# Patient Record
Sex: Female | Born: 1935 | Race: White | Hispanic: No | Smoking: Never smoker
Health system: Southern US, Community
[De-identification: ages and names within clinical notes are randomized; demographics above are authoritative.]

## PROBLEM LIST (undated history)

## (undated) DIAGNOSIS — K219 Gastro-esophageal reflux disease without esophagitis: Secondary | ICD-10-CM

## (undated) DIAGNOSIS — C801 Malignant (primary) neoplasm, unspecified: Secondary | ICD-10-CM

## (undated) DIAGNOSIS — J45909 Unspecified asthma, uncomplicated: Secondary | ICD-10-CM

## (undated) DIAGNOSIS — D696 Thrombocytopenia, unspecified: Secondary | ICD-10-CM

## (undated) DIAGNOSIS — M1990 Unspecified osteoarthritis, unspecified site: Secondary | ICD-10-CM

## (undated) DIAGNOSIS — I10 Essential (primary) hypertension: Secondary | ICD-10-CM

## (undated) DIAGNOSIS — F419 Anxiety disorder, unspecified: Secondary | ICD-10-CM

## (undated) DIAGNOSIS — R51 Headache: Secondary | ICD-10-CM

## (undated) HISTORY — PX: GANGLION CYST EXCISION: SHX1691

## (undated) HISTORY — PX: ANKLE FUSION: SHX881

## (undated) HISTORY — PX: OTHER SURGICAL HISTORY: SHX169

## (undated) HISTORY — PX: ABDOMINAL HYSTERECTOMY: SHX81

## (undated) HISTORY — PX: EYE SURGERY: SHX253

## (undated) HISTORY — PX: OVARIAN CYST SURGERY: SHX726

## (undated) SURGERY — HEMIARTHROPLASTY, HIP, DIRECT ANTERIOR APPROACH, FOR FRACTURE
Anesthesia: Choice | Laterality: Left

---

## 1998-05-14 ENCOUNTER — Ambulatory Visit (HOSPITAL_COMMUNITY): Admission: RE | Admit: 1998-05-14 | Discharge: 1998-05-14 | Payer: Self-pay | Admitting: Orthopaedic Surgery

## 2003-06-15 ENCOUNTER — Ambulatory Visit (HOSPITAL_COMMUNITY): Admission: RE | Admit: 2003-06-15 | Discharge: 2003-06-15 | Payer: Self-pay | Admitting: Obstetrics and Gynecology

## 2003-08-16 ENCOUNTER — Encounter (INDEPENDENT_AMBULATORY_CARE_PROVIDER_SITE_OTHER): Payer: Self-pay | Admitting: Specialist

## 2003-08-16 ENCOUNTER — Ambulatory Visit (HOSPITAL_COMMUNITY): Admission: RE | Admit: 2003-08-16 | Discharge: 2003-08-16 | Payer: Self-pay | Admitting: Gastroenterology

## 2004-06-19 ENCOUNTER — Encounter: Admission: RE | Admit: 2004-06-19 | Discharge: 2004-06-19 | Payer: Self-pay | Admitting: Family Medicine

## 2004-10-02 ENCOUNTER — Ambulatory Visit (HOSPITAL_COMMUNITY): Admission: RE | Admit: 2004-10-02 | Discharge: 2004-10-02 | Payer: Self-pay | Admitting: Obstetrics and Gynecology

## 2006-04-06 ENCOUNTER — Encounter: Admission: RE | Admit: 2006-04-06 | Discharge: 2006-04-06 | Payer: Self-pay | Admitting: Otolaryngology

## 2007-12-20 ENCOUNTER — Encounter (INDEPENDENT_AMBULATORY_CARE_PROVIDER_SITE_OTHER): Payer: Self-pay | Admitting: Orthopedic Surgery

## 2007-12-20 ENCOUNTER — Ambulatory Visit: Payer: Self-pay | Admitting: Vascular Surgery

## 2007-12-20 ENCOUNTER — Ambulatory Visit (HOSPITAL_COMMUNITY): Admission: RE | Admit: 2007-12-20 | Discharge: 2007-12-20 | Payer: Self-pay | Admitting: Orthopedic Surgery

## 2010-12-27 NOTE — Op Note (Signed)
NAME:  DESHANNA, KAMA                            ACCOUNT NO.:  [id number]   MEDICAL RECORD NO.:  [id number]                   PATIENT TYPE:  AMB   LOCATION:  ENDO                                 FACILITY:  [facility]   PHYSICIAN:  Danise Edge, M.D.                DATE OF BIRTH:  [birth year]   DATE OF PROCEDURE:  08/16/2003  DATE OF DISCHARGE:                                 OPERATIVE REPORT   PROCEDURE:  Colonoscopy and polypectomy.   PROCEDURE INDICATION:  Ms. Rielly Brunn is a 77 year old female born Oct 20, 1933.  Mr. Denny Levy mother underwent surgery for colon cancer in her 63s.   ENDOSCOPIST:  Danise Edge, M.D.   PREMEDICATION:  Versed 7 mg, Demerol 70 mg.   DESCRIPTION OF PROCEDURE:  After obtaining informed consent, Mr. Rosalia Hammers was  placed in the left lateral decubitus position.  I administered intravenous  Demerol and intravenous Versed to achieve conscious sedation for the  procedure.  The patient's blood pressure, oxygen saturation, and cardiac  rhythm were monitored throughout the procedure and documented in the medical  record.   Anal inspection was normal.  Digital rectal exam was normal.  The Olympus  adjustable pediatric colonoscope was introduced into the rectum and advanced  to the cecum.  Colonic preparation for the exam today was excellent.   Rectum normal.   Sigmoid colon and descending colon:  At 55 cm from the anal verge a 2 mm  sessile polyp was removed with electrocautery snare.  No pathology was  retrieved.   Splenic flexure normal.   Transverse colon normal.   Hepatic flexure:  A 3-4 mm sessile polyp was removed from the hepatic  flexure with the electrocautery snare and submitted for pathologic  interpretation.   Ascending colon normal.   Cecum and ileocecal valve normal.   ASSESSMENT:  1. A small polyp was removed from the hepatic flexure with the     electrocautery snare and submitted for pathologic interpretation.  2. A small polyp was  removed from the descending colon with the     electrocautery snare, but no pathology was retrieved.   RECOMMENDATIONS:  Repeat colonoscopy in five years.                                               Danise Edge, M.D.    MJ/MEDQ  D:  08/16/2003  T:  08/16/2003  Job:  [id number]   cc:   L. Lupe Carney, M.D.  [address] [place]  [place] [address]  Fax: [phone]

## 2012-07-19 ENCOUNTER — Other Ambulatory Visit: Payer: Self-pay | Admitting: Internal Medicine

## 2012-07-19 DIAGNOSIS — R109 Unspecified abdominal pain: Secondary | ICD-10-CM

## 2012-07-19 DIAGNOSIS — R197 Diarrhea, unspecified: Secondary | ICD-10-CM

## 2012-07-21 ENCOUNTER — Ambulatory Visit
Admission: RE | Admit: 2012-07-21 | Discharge: 2012-07-21 | Disposition: A | Discharge status: Home / Self Care | Payer: Medicare Other | Source: Ambulatory Visit | Attending: Internal Medicine | Admitting: Internal Medicine

## 2012-07-21 DIAGNOSIS — R197 Diarrhea, unspecified: Secondary | ICD-10-CM

## 2012-07-21 DIAGNOSIS — R109 Unspecified abdominal pain: Secondary | ICD-10-CM

## 2013-05-12 ENCOUNTER — Inpatient Hospital Stay (HOSPITAL_COMMUNITY)
Admission: AD | Admit: 2013-05-12 | Discharge: 2013-05-16 | DRG: 470 | Disposition: A | Discharge status: Skilled Nursing Facility | Payer: Medicare Other | Source: Intra-hospital | Attending: Internal Medicine | Admitting: Internal Medicine

## 2013-05-12 ENCOUNTER — Encounter (HOSPITAL_COMMUNITY): Payer: Self-pay | Admitting: Internal Medicine

## 2013-05-12 DIAGNOSIS — E876 Hypokalemia: Secondary | ICD-10-CM | POA: Diagnosis present

## 2013-05-12 DIAGNOSIS — S72143A Displaced intertrochanteric fracture of unspecified femur, initial encounter for closed fracture: Principal | ICD-10-CM | POA: Diagnosis present

## 2013-05-12 DIAGNOSIS — W108XXA Fall (on) (from) other stairs and steps, initial encounter: Secondary | ICD-10-CM | POA: Diagnosis present

## 2013-05-12 DIAGNOSIS — Z882 Allergy status to sulfonamides status: Secondary | ICD-10-CM

## 2013-05-12 DIAGNOSIS — Z79899 Other long term (current) drug therapy: Secondary | ICD-10-CM

## 2013-05-12 DIAGNOSIS — D696 Thrombocytopenia, unspecified: Secondary | ICD-10-CM

## 2013-05-12 DIAGNOSIS — D62 Acute posthemorrhagic anemia: Secondary | ICD-10-CM | POA: Diagnosis not present

## 2013-05-12 DIAGNOSIS — K219 Gastro-esophageal reflux disease without esophagitis: Secondary | ICD-10-CM

## 2013-05-12 DIAGNOSIS — Z7901 Long term (current) use of anticoagulants: Secondary | ICD-10-CM

## 2013-05-12 DIAGNOSIS — I10 Essential (primary) hypertension: Secondary | ICD-10-CM

## 2013-05-12 DIAGNOSIS — K5900 Constipation, unspecified: Secondary | ICD-10-CM

## 2013-05-12 DIAGNOSIS — S72142A Displaced intertrochanteric fracture of left femur, initial encounter for closed fracture: Secondary | ICD-10-CM

## 2013-05-12 DIAGNOSIS — Y92009 Unspecified place in unspecified non-institutional (private) residence as the place of occurrence of the external cause: Secondary | ICD-10-CM

## 2013-05-12 HISTORY — DX: Gastro-esophageal reflux disease without esophagitis: K21.9

## 2013-05-12 HISTORY — DX: Essential (primary) hypertension: I10

## 2013-05-12 MED ORDER — HYDROCODONE-ACETAMINOPHEN 5-325 MG PO TABS
1.0000 | ORAL_TABLET | Freq: Four times a day (QID) | ORAL | Status: DC | PRN
  Administered 2013-05-13: 1 via ORAL
  Filled 2013-05-12 (×2): qty 1

## 2013-05-12 MED ORDER — PANTOPRAZOLE SODIUM 40 MG PO TBEC
40.0000 mg | DELAYED_RELEASE_TABLET | Freq: Every day | ORAL | Status: DC
Start: 2013-05-13 — End: 2013-05-13
  Administered 2013-05-13: 40 mg via ORAL
  Filled 2013-05-12: qty 1

## 2013-05-12 MED ORDER — MORPHINE SULFATE 2 MG/ML IJ SOLN
0.5000 mg | INTRAMUSCULAR | Status: DC | PRN
  Administered 2013-05-13: 0.5 mg via INTRAVENOUS
  Filled 2013-05-12: qty 1

## 2013-05-12 NOTE — Consult Note (Signed)
No primary provider on file. Chief Complaint: S/p fall History: Pleasant woman who fell from standing height. No LOC.  She did strike her head and has a bruise.  Unable to ambulate or weight bear on left leg.  Transferred from outside hospital for definitive fracture mangement  No past medical history on file.  Allergies  Allergen Reactions  . Iohexol      Code: HIVES, Desc: (+) SKIN TEST 40YRS AGO, OK W/ 1VP 15 YRS AGO, TODAY W/ HIVES, 50MG BENADRYL PO..PT OK, SUGGESTED 13 PRE MEDS FOR FUTURE IV CONTRAST PER DR.MATTERN//A.C.PT CALLS US 2 DAYS POST INJ, SHE HAS BEEN NAUSEATED SINCE EXAM & HAS STARTED TO ITCH AGAIN,,, WE RE, Onset Date: [id number]     No current facility-administered medications on file prior to encounter.   No current outpatient prescriptions on file prior to encounter.    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   [facility] Admit to medical team per fracture protocal Will review films with my partner for definitive fracture management   

## 2013-05-12 NOTE — Progress Notes (Deleted)
Called by Dr. Floydene Flock from [facility] in [place]. Pt with a no significant phx presents with fracture to L fem s/p mechanical fall off a friend's deck. 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 Dr. Shon Baton (Ortho) was already called with instructions to formally consult ortho when pt arrives to [facility].  Pt accepted to med-surg bed.

## 2013-05-12 NOTE — H&P (Signed)
[facility] History and Physical  Heather Fitzgerald:[id number] DOB: 1933/12/10 DOA: 05/12/2013  Referring physician: ED PCP: No primary provider on file.    Chief Complaint: Fall, Hip pain  HPI: Heather Fitzgerald is a 79 y.o. female previously healthy, who suffered a mechanical fall while walking on a friends deck and missed a step down (didnt know that a step was there).  She fell landing on her L side.  After the fall she was unable to bear weight due to severe pain in her L hip.  Pain better with rest, worse with movement.  She was taken to [facility] where a L hip fracture was diagnosed.  The patient was then transferred to [facility]  Review of Systems: 12 systems reviewed and otherwise negative.  No past medical history on file. No past surgical history on file. Social History:  reports that she has never smoked. She does not have any smokeless tobacco history on file. She reports that she does not drink alcohol or use illicit drugs.  Allergies  Allergen Reactions  . Iohexol      Code: HIVES, Desc: (+) SKIN TEST 46YRS AGO, OK W/ 1VP 15 YRS AGO, TODAY W/ HIVES, 50MG  BENADRYL PO.Marland KitchenPT OK, SUGGESTED 13 PRE MEDS FOR FUTURE IV CONTRAST PER DR.MATTERN//A.C.PT CALLS [place] 2 DAYS POST INJ, SHE HAS BEEN NAUSEATED SINCE EXAM & HAS STARTED TO ITCH AGAIN,,, WE RE, Onset Date: [id number]     No family history on file.  Prior to Admission medications   Not on File   Physical Exam: Filed Vitals:   05/12/13 2240  BP: 152/68  Pulse: 85  Temp: 98.3 F (36.8 C)  Resp: 20    General:  NAD, resting comfortably in bed Eyes: PEERLA EOMI ENT: mucous membranes moist Neck: supple w/o JVD Cardiovascular: RRR w/o MRG Respiratory: CTA B Abdomen: soft, nt, nd, bs+ Skin: no rash nor lesion Musculoskeletal: MAE, pain at L hip Psychiatric: normal tone and affect Neurologic: AAOx3, grossly non-focal  Labs on Admission:  Basic Metabolic Panel: No results found for this basename: NA, K, CL, CO2,  GLUCOSE, BUN, CREATININE, CALCIUM, MG, PHOS,  in the last 168 hours Liver Function Tests: No results found for this basename: AST, ALT, ALKPHOS, BILITOT, PROT, ALBUMIN,  in the last 168 hours No results found for this basename: LIPASE, AMYLASE,  in the last 168 hours No results found for this basename: AMMONIA,  in the last 168 hours CBC: No results found for this basename: WBC, NEUTROABS, HGB, HCT, MCV, PLT,  in the last 168 hours Cardiac Enzymes: No results found for this basename: CKTOTAL, CKMB, CKMBINDEX, TROPONINI,  in the last 168 hours  BNP (last 3 results) No results found for this basename: PROBNP,  in the last 8760 hours CBG: No results found for this basename: GLUCAP,  in the last 168 hours  Radiological Exams on Admission: No results found.  EKG: Ordered and pending.  Assessment/Plan Principal Problem:   Intertrochanteric fracture of left hip   1. Intertrochanteric fracture of left hip - Patient to be evaluated by ortho tomorrow AM, likely will then go to OR for repair.  Ordering lab work, understand that she was initially hypokalemic at outside facility, checking BMP to ensure that this has been corrected with the replacement she received.  EKG also ordered for pre-op evaluation.  No chest pain nor SOB recently, sounds like she is very active at baseline.    Code Status: Full Code (must indicate code status--if unknown  or must be presumed, indicate so) Family Communication: No family in room (indicate person spoken with, if applicable, with phone number if by telephone) Disposition Plan: Admit to inpatient (indicate anticipated LOS)  Time spent: 70 min  Treylin Burtch M. [facility] Pager [phone]  If 7PM-7AM, please contact night-coverage [email or web address] Password [email or web address] 05/12/2013, 11:40 PM

## 2013-05-12 NOTE — Progress Notes (Signed)
Called by Dr. Floydene Flock from [facility] in [place]. Pt with a no significant phx presents with fracture to L fem s/p mechanical fall off a friend's deck. 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 Dr. Shon Baton (Ortho) was already called with instructions to formally consult ortho when pt arrives to [facility].   Pt accepted to med-surg bed.

## 2013-05-13 ENCOUNTER — Encounter (HOSPITAL_COMMUNITY)
Admission: AD | Disposition: A | Discharge status: Skilled Nursing Facility | Payer: Self-pay | Source: Intra-hospital | Attending: Internal Medicine

## 2013-05-13 ENCOUNTER — Encounter (HOSPITAL_COMMUNITY): Payer: Self-pay | Admitting: Anesthesiology

## 2013-05-13 ENCOUNTER — Inpatient Hospital Stay (HOSPITAL_COMMUNITY): Payer: Medicare Other | Admitting: Anesthesiology

## 2013-05-13 ENCOUNTER — Inpatient Hospital Stay (HOSPITAL_COMMUNITY): Payer: Medicare Other

## 2013-05-13 DIAGNOSIS — I10 Essential (primary) hypertension: Secondary | ICD-10-CM

## 2013-05-13 DIAGNOSIS — K219 Gastro-esophageal reflux disease without esophagitis: Secondary | ICD-10-CM

## 2013-05-13 DIAGNOSIS — D696 Thrombocytopenia, unspecified: Secondary | ICD-10-CM

## 2013-05-13 HISTORY — PX: HIP ARTHROPLASTY: SHX981

## 2013-05-13 LAB — CBC
HCT: 38.8 % (ref 36.0–46.0)
Hemoglobin: 13.1 g/dL (ref 12.0–15.0)
MCH: 29.6 pg (ref 26.0–34.0)
MCHC: 33.8 g/dL (ref 30.0–36.0)
MCV: 87.6 fL (ref 78.0–100.0)
RDW: 13.8 % (ref 11.5–15.5)
WBC: 6 10*3/uL (ref 4.0–10.5)

## 2013-05-13 LAB — APTT: aPTT: 26 seconds (ref 24–37)

## 2013-05-13 LAB — BASIC METABOLIC PANEL
BUN: 11 mg/dL (ref 6–23)
Calcium: 8.1 mg/dL — ABNORMAL LOW (ref 8.4–10.5)
Chloride: 102 mEq/L (ref 96–112)
GFR calc Af Amer: >90 mL/min (ref >90)
GFR calc non Af Amer: 88 mL/min — ABNORMAL LOW (ref >90)
Potassium: 3.7 mEq/L (ref 3.5–5.1)
Sodium: 137 mEq/L (ref 135–145)

## 2013-05-13 LAB — PROTIME-INR
INR: 1.02 (ref 0.00–1.49)
Prothrombin Time: 13.2 seconds (ref 11.6–15.2)

## 2013-05-13 SURGERY — HEMIARTHROPLASTY, HIP, DIRECT ANTERIOR APPROACH, FOR FRACTURE
Anesthesia: General | Site: Hip | Laterality: Left | Wound class: Clean

## 2013-05-13 MED ORDER — ACETAMINOPHEN 325 MG PO TABS: 650.0000 mg | ORAL_TABLET | Freq: Four times a day (QID) | ORAL | Status: DC | PRN

## 2013-05-13 MED ORDER — ALPRAZOLAM 0.25 MG PO TABS: 0.2500 mg | ORAL_TABLET | Freq: Every day | ORAL | Status: DC | PRN

## 2013-05-13 MED ORDER — PHENOL 1.4 % MT LIQD: 1.0000 | OROMUCOSAL | Status: DC | PRN

## 2013-05-13 MED ORDER — METOCLOPRAMIDE HCL 5 MG/ML IJ SOLN
5.0000 mg | Freq: Three times a day (TID) | INTRAMUSCULAR | Status: DC | PRN
  Administered 2013-05-13: 5 mg via INTRAVENOUS
  Filled 2013-05-13: qty 2

## 2013-05-13 MED ORDER — ESTROGENS CONJUGATED 0.625 MG PO TABS
0.6250 mg | ORAL_TABLET | Freq: Every day | ORAL | Status: DC
  Administered 2013-05-14 – 2013-05-16 (×3): 0.625 mg via ORAL
  Filled 2013-05-13 (×3): qty 1

## 2013-05-13 MED ORDER — METHOCARBAMOL 100 MG/ML IJ SOLN: 500.0000 mg | Freq: Four times a day (QID) | INTRAVENOUS | Status: DC | PRN

## 2013-05-13 MED ORDER — MENTHOL 3 MG MT LOZG: 1.0000 | LOZENGE | OROMUCOSAL | Status: DC | PRN

## 2013-05-13 MED ORDER — ONDANSETRON HCL 4 MG PO TABS: 4.0000 mg | ORAL_TABLET | Freq: Four times a day (QID) | ORAL | Status: DC | PRN

## 2013-05-13 MED ORDER — CEFAZOLIN SODIUM-DEXTROSE 2-3 GM-% IV SOLR
2.0000 g | INTRAVENOUS | Status: AC
  Administered 2013-05-13: 2 g via INTRAVENOUS
  Filled 2013-05-13 (×2): qty 50

## 2013-05-13 MED ORDER — DIAZEPAM 5 MG PO TABS: 5.0000 mg | ORAL_TABLET | Freq: Once | ORAL | Status: DC

## 2013-05-13 MED ORDER — LIDOCAINE HCL (CARDIAC) 20 MG/ML IV SOLN
INTRAVENOUS | Status: DC | PRN
  Administered 2013-05-13: 100 mg via INTRAVENOUS

## 2013-05-13 MED ORDER — SODIUM CHLORIDE 0.9 % IV SOLN
INTRAVENOUS | Status: DC
  Administered 2013-05-13: 23:00:00 via INTRAVENOUS

## 2013-05-13 MED ORDER — SUFENTANIL CITRATE 50 MCG/ML IV SOLN
INTRAVENOUS | Status: DC | PRN
  Administered 2013-05-13 (×2): 10 ug via INTRAVENOUS

## 2013-05-13 MED ORDER — ONDANSETRON HCL 4 MG/2ML IJ SOLN
INTRAMUSCULAR | Status: DC | PRN
  Administered 2013-05-13: 4 mg via INTRAVENOUS

## 2013-05-13 MED ORDER — DIAZEPAM 5 MG/ML IJ SOLN
5.0000 mg | Freq: Once | INTRAMUSCULAR | Status: AC
  Administered 2013-05-13: 5 mg via INTRAVENOUS
  Filled 2013-05-13: qty 2

## 2013-05-13 MED ORDER — CYCLOBENZAPRINE HCL 10 MG PO TABS: 10.0000 mg | ORAL_TABLET | Freq: Three times a day (TID) | ORAL | Status: DC | PRN

## 2013-05-13 MED ORDER — TRAMADOL HCL 50 MG PO TABS: 50.0000 mg | ORAL_TABLET | Freq: Four times a day (QID) | ORAL | Status: DC | PRN

## 2013-05-13 MED ORDER — GLYCOPYRROLATE 0.2 MG/ML IJ SOLN
INTRAMUSCULAR | Status: DC | PRN
  Administered 2013-05-13: 0.4 mg via INTRAVENOUS

## 2013-05-13 MED ORDER — BISACODYL 10 MG RE SUPP: 10.0000 mg | Freq: Every day | RECTAL | Status: DC | PRN

## 2013-05-13 MED ORDER — SODIUM CHLORIDE 0.9 % IV SOLN
INTRAVENOUS | Status: DC
  Administered 2013-05-13: 12:00:00 via INTRAVENOUS

## 2013-05-13 MED ORDER — METOCLOPRAMIDE HCL 5 MG/ML IJ SOLN: 10.0000 mg | Freq: Once | INTRAMUSCULAR | Status: DC | PRN

## 2013-05-13 MED ORDER — LACTATED RINGERS IV SOLN
INTRAVENOUS | Status: DC | PRN
  Administered 2013-05-13 (×2): via INTRAVENOUS

## 2013-05-13 MED ORDER — MIDAZOLAM HCL 5 MG/5ML IJ SOLN
INTRAMUSCULAR | Status: DC | PRN
  Administered 2013-05-13: 2 mg via INTRAVENOUS

## 2013-05-13 MED ORDER — PHENYLEPHRINE HCL 10 MG/ML IJ SOLN
INTRAMUSCULAR | Status: DC | PRN
  Administered 2013-05-13 (×5): 80 ug via INTRAVENOUS

## 2013-05-13 MED ORDER — ROCURONIUM BROMIDE 100 MG/10ML IV SOLN
INTRAVENOUS | Status: DC | PRN
  Administered 2013-05-13: 35 mg via INTRAVENOUS

## 2013-05-13 MED ORDER — ONDANSETRON HCL 4 MG/2ML IJ SOLN: 4.0000 mg | Freq: Four times a day (QID) | INTRAMUSCULAR | Status: DC | PRN

## 2013-05-13 MED ORDER — ESTROGENS CONJ SYNTHETIC A 0.625 MG PO TABS: 0.6250 mg | ORAL_TABLET | Freq: Every day | ORAL | Status: DC

## 2013-05-13 MED ORDER — METHOCARBAMOL 500 MG PO TABS
500.0000 mg | ORAL_TABLET | Freq: Four times a day (QID) | ORAL | Status: DC | PRN
  Administered 2013-05-15 (×2): 500 mg via ORAL
  Filled 2013-05-13 (×2): qty 1

## 2013-05-13 MED ORDER — HYDRALAZINE HCL 20 MG/ML IJ SOLN: 10.0000 mg | Freq: Three times a day (TID) | INTRAMUSCULAR | Status: DC | PRN

## 2013-05-13 MED ORDER — SODIUM CHLORIDE 0.9 % IR SOLN
Status: DC | PRN
  Administered 2013-05-13: 1000 mL

## 2013-05-13 MED ORDER — PANTOPRAZOLE SODIUM 40 MG PO TBEC
40.0000 mg | DELAYED_RELEASE_TABLET | Freq: Every day | ORAL | Status: DC
  Administered 2013-05-14 – 2013-05-16 (×3): 40 mg via ORAL
  Filled 2013-05-13 (×3): qty 1

## 2013-05-13 MED ORDER — NEOSTIGMINE METHYLSULFATE 1 MG/ML IJ SOLN
INTRAMUSCULAR | Status: DC | PRN
  Administered 2013-05-13: 3 mg via INTRAVENOUS

## 2013-05-13 MED ORDER — HYDROCODONE-ACETAMINOPHEN 5-325 MG PO TABS
1.0000 | ORAL_TABLET | Freq: Four times a day (QID) | ORAL | Status: DC | PRN
  Administered 2013-05-13 – 2013-05-16 (×7): 1 via ORAL
  Filled 2013-05-13 (×7): qty 1

## 2013-05-13 MED ORDER — DEXAMETHASONE SODIUM PHOSPHATE 4 MG/ML IJ SOLN
INTRAMUSCULAR | Status: DC | PRN
  Administered 2013-05-13: 4 mg via INTRAVENOUS

## 2013-05-13 MED ORDER — FENTANYL CITRATE 0.05 MG/ML IJ SOLN: 25.0000 ug | INTRAMUSCULAR | Status: DC | PRN

## 2013-05-13 MED ORDER — FERROUS SULFATE 325 (65 FE) MG PO TABS
325.0000 mg | ORAL_TABLET | Freq: Three times a day (TID) | ORAL | Status: DC
  Administered 2013-05-14 – 2013-05-16 (×7): 325 mg via ORAL
  Filled 2013-05-13 (×10): qty 1

## 2013-05-13 MED ORDER — CHLORHEXIDINE GLUCONATE 4 % EX LIQD
60.0000 mL | Freq: Once | CUTANEOUS | Status: DC
  Filled 2013-05-13: qty 60

## 2013-05-13 MED ORDER — PROPOFOL 10 MG/ML IV BOLUS
INTRAVENOUS | Status: DC | PRN
  Administered 2013-05-13: 150 mg via INTRAVENOUS

## 2013-05-13 MED ORDER — ENOXAPARIN SODIUM 40 MG/0.4ML ~~LOC~~ SOLN
40.0000 mg | SUBCUTANEOUS | Status: DC
Start: 2013-05-14 — End: 2013-05-16
  Administered 2013-05-14 – 2013-05-16 (×3): 40 mg via SUBCUTANEOUS
  Filled 2013-05-13 (×4): qty 0.4

## 2013-05-13 MED ORDER — ACETAMINOPHEN 650 MG RE SUPP: 650.0000 mg | Freq: Four times a day (QID) | RECTAL | Status: DC | PRN

## 2013-05-13 MED ORDER — CEFAZOLIN SODIUM-DEXTROSE 2-3 GM-% IV SOLR
2.0000 g | Freq: Four times a day (QID) | INTRAVENOUS | Status: AC
  Administered 2013-05-13 – 2013-05-14 (×2): 2 g via INTRAVENOUS
  Filled 2013-05-13 (×2): qty 50

## 2013-05-13 MED ORDER — MORPHINE SULFATE 2 MG/ML IJ SOLN: 0.5000 mg | INTRAMUSCULAR | Status: DC | PRN

## 2013-05-13 MED ORDER — CYCLOBENZAPRINE HCL 10 MG PO TABS: 5.0000 mg | ORAL_TABLET | Freq: Three times a day (TID) | ORAL | Status: DC | PRN

## 2013-05-13 MED ORDER — METOCLOPRAMIDE HCL 10 MG PO TABS: 5.0000 mg | ORAL_TABLET | Freq: Three times a day (TID) | ORAL | Status: DC | PRN

## 2013-05-13 MED ORDER — LOTEPREDNOL ETABONATE 0.5 % OP SUSP
2.0000 [drp] | Freq: Two times a day (BID) | OPHTHALMIC | Status: DC
  Administered 2013-05-13: 2 [drp] via OPHTHALMIC
  Filled 2013-05-13: qty 5

## 2013-05-13 MED ORDER — ENOXAPARIN SODIUM 40 MG/0.4ML ~~LOC~~ SOLN: 40.0000 mg | SUBCUTANEOUS | Status: DC

## 2013-05-13 MED ORDER — PANTOPRAZOLE SODIUM 40 MG IV SOLR
40.0000 mg | Freq: Two times a day (BID) | INTRAVENOUS | Status: DC
  Administered 2013-05-13: 40 mg via INTRAVENOUS
  Filled 2013-05-13 (×2): qty 40

## 2013-05-13 SURGICAL SUPPLY — 47 items
BLADE SAW SAG 73X25 THK (BLADE) ×1
BLADE SAW SGTL 73X25 THK (BLADE) ×1 IMPLANT
BRUSH FEMORAL CANAL (MISCELLANEOUS) IMPLANT
CAPT HIP HD POR BIPOL/UNIPOL ×1 IMPLANT
CLOTH BEACON ORANGE TIMEOUT ST (SAFETY) ×2 IMPLANT
COVER SURGICAL LIGHT HANDLE (MISCELLANEOUS) ×2 IMPLANT
DRAPE INCISE IOBAN 66X45 STRL (DRAPES) ×2 IMPLANT
DRAPE ORTHO SPLIT 77X108 STRL (DRAPES) ×4
DRAPE SURG ORHT 6 SPLT 77X108 (DRAPES) ×2 IMPLANT
DRAPE U-SHAPE 47X51 STRL (DRAPES) ×2 IMPLANT
DRSG MEPILEX BORDER 4X8 (GAUZE/BANDAGES/DRESSINGS) ×2 IMPLANT
DURAPREP 26ML APPLICATOR (WOUND CARE) ×2 IMPLANT
ELECT BLADE 4.0 EZ CLEAN MEGAD (MISCELLANEOUS) ×2
ELECT REM PT RETURN 9FT ADLT (ELECTROSURGICAL) ×2
ELECTRODE BLDE 4.0 EZ CLN MEGD (MISCELLANEOUS) IMPLANT
ELECTRODE REM PT RTRN 9FT ADLT (ELECTROSURGICAL) ×1 IMPLANT
GLOVE BIOGEL PI ORTHO PRO 7.5 (GLOVE) ×1
GLOVE BIOGEL PI ORTHO PRO SZ8 (GLOVE) ×1
GLOVE ORTHO TXT STRL SZ7.5 (GLOVE) ×2 IMPLANT
GLOVE PI ORTHO PRO STRL 7.5 (GLOVE) ×1 IMPLANT
GLOVE PI ORTHO PRO STRL SZ8 (GLOVE) ×1 IMPLANT
GLOVE SURG ORTHO 8.5 STRL (GLOVE) ×2 IMPLANT
GOWN STRL NON-REIN LRG LVL3 (GOWN DISPOSABLE) ×2 IMPLANT
GOWN STRL REIN XL XLG (GOWN DISPOSABLE) ×4 IMPLANT
HANDPIECE INTERPULSE COAX TIP (DISPOSABLE)
KIT BASIN OR (CUSTOM PROCEDURE TRAY) ×2 IMPLANT
KIT ROOM TURNOVER OR (KITS) ×2 IMPLANT
MANIFOLD NEPTUNE II (INSTRUMENTS) ×2 IMPLANT
NS IRRIG 1000ML POUR BTL (IV SOLUTION) ×2 IMPLANT
PACK TOTAL JOINT (CUSTOM PROCEDURE TRAY) ×2 IMPLANT
PAD ARMBOARD 7.5X6 YLW CONV (MISCELLANEOUS) ×4 IMPLANT
SET HNDPC FAN SPRY TIP SCT (DISPOSABLE) IMPLANT
STAPLER VISISTAT 35W (STAPLE) ×2 IMPLANT
STRIP CLOSURE SKIN 1/2X4 (GAUZE/BANDAGES/DRESSINGS) IMPLANT
SUT ETHIBOND NAB CT1 #1 30IN (SUTURE) ×4 IMPLANT
SUT MNCRL AB 3-0 PS2 18 (SUTURE) IMPLANT
SUT VIC AB 0 CT1 27 (SUTURE) ×2
SUT VIC AB 0 CT1 27XBRD ANBCTR (SUTURE) ×1 IMPLANT
SUT VIC AB 1 CT1 27 (SUTURE)
SUT VIC AB 1 CT1 27XBRD ANBCTR (SUTURE) IMPLANT
SUT VIC AB 2-0 CT1 27 (SUTURE) ×2
SUT VIC AB 2-0 CT1 TAPERPNT 27 (SUTURE) ×1 IMPLANT
TOWEL OR 17X24 6PK STRL BLUE (TOWEL DISPOSABLE) ×2 IMPLANT
TOWEL OR 17X26 10 PK STRL BLUE (TOWEL DISPOSABLE) ×2 IMPLANT
TOWER CARTRIDGE SMART MIX (DISPOSABLE) IMPLANT
TRAY FOLEY CATH 16FRSI W/METER (SET/KITS/TRAYS/PACK) IMPLANT
WATER STERILE IRR 1000ML POUR (IV SOLUTION) ×6 IMPLANT

## 2013-05-13 NOTE — Preoperative (Signed)
Beta Blockers   Reason not to administer Beta Blockers:Not Applicable 

## 2013-05-13 NOTE — Anesthesia Preprocedure Evaluation (Signed)
Anesthesia Evaluation  Patient identified by MRN, date of birth, ID band Patient awake    Reviewed: Allergy & Precautions, H&P , NPO status , Patient's Chart, lab work & pertinent test results, reviewed documented beta blocker date and time   Airway Mallampati: II TM Distance: >3 FB Neck ROM: full    Dental   Pulmonary neg pulmonary ROS,  breath sounds clear to auscultation        Cardiovascular hypertension, On Medications Rhythm:regular     Neuro/Psych negative neurological ROS  negative psych ROS   GI/Hepatic Neg liver ROS, GERD-  Medicated,  Endo/Other  negative endocrine ROS  Renal/GU negative Renal ROS  negative genitourinary   Musculoskeletal   Abdominal   Peds  Hematology negative hematology ROS (+)   Anesthesia Other Findings See surgeon's H&P   Reproductive/Obstetrics negative OB ROS                           Anesthesia Physical Anesthesia Plan  ASA: II  Anesthesia Plan: General   Post-op Pain Management:    Induction: Intravenous  Airway Management Planned: Oral ETT  Additional Equipment:   Intra-op Plan:   Post-operative Plan: Extubation in OR  Informed Consent: I have reviewed the patients History and Physical, chart, labs and discussed the procedure including the risks, benefits and alternatives for the proposed anesthesia with the patient or authorized representative who has indicated his/her understanding and acceptance.   Dental Advisory Given  Plan Discussed with: CRNA and Surgeon  Anesthesia Plan Comments:         Anesthesia Quick Evaluation

## 2013-05-13 NOTE — H&P (View-Only) (Signed)
No primary provider on file. Chief Complaint: S/p fall History: Pleasant woman who fell from standing height. No LOC.  She did strike her head and has a bruise.  Unable to ambulate or weight bear on left leg.  Transferred from outside hospital for definitive fracture mangement  No past medical history on file.  Allergies  Allergen Reactions  . Iohexol      Code: HIVES, Desc: (+) SKIN TEST 64YRS AGO, OK W/ 1VP 15 YRS AGO, TODAY W/ HIVES, 50MG  BENADRYL PO.Marland KitchenPT OK, SUGGESTED 13 PRE MEDS FOR FUTURE IV CONTRAST PER DR.MATTERN//A.C.PT CALLS [place] 2 DAYS POST INJ, SHE HAS BEEN NAUSEATED SINCE EXAM & HAS STARTED TO ITCH AGAIN,,, WE RE, Onset Date: [id number]     No current facility-administered medications on file prior to encounter.   No current outpatient prescriptions on file prior to encounter.    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   [facility] Admit to medical team per fracture protocal Will review films with my partner for definitive fracture management

## 2013-05-13 NOTE — Progress Notes (Signed)
[facility] PROGRESS NOTE  Heather Fitzgerald ZOX:[id number] DOB: 12-09-1933 DOA: 05/12/2013 PCP: No primary provider on file.  Assessment/Plan: 1-Intertrochanteric fracture of left hip after a mechanical fall. EKG with sinus rhythm no ST changes. Will check one time troponin due to chest discomfort. Moderate risk for surgery.   2-Hypertension : Blood pressure elevated. Will order PRN hydralazine.  3-GERD: protonix IV BID.  4-Thrombocytopenia: monitor.    Code Status: Full code.  Family Communication: Care discussed with patient.  Disposition Plan: remain in Hospital.    Consultants:  Dr Heather Fitzgerald No.   Procedures:  none  Antibiotics:  none  HPI/Subjective: She is complaining of cramps on right leg, chronic problem for her.  She is complaining of GERD like symptoms, burning sensation chest.  She denies chest pain or dyspnea on exertion.   Objective: Filed Vitals:   05/13/13 0800  BP:   Pulse:   Temp:   Resp: 18    Intake/Output Summary (Last 24 hours) at 05/13/13 1152 Last data filed at 05/13/13 0953  Gross per 24 hour  Intake      0 ml  Output   1200 ml  Net  -1200 ml   Filed Weights   05/12/13 2240  Weight: 59.5 kg (131 lb 2.8 oz)    Exam:   General:  No distress.   Cardiovascular: S 1, S 2 RRR  Respiratory: CTA  Abdomen: BS present, soft, nt  Musculoskeletal: no edema  Data Reviewed: Basic Metabolic Panel:  Recent Labs Lab 05/13/13 0030  NA 137  K 3.7  CL 102  CO2 25  GLUCOSE 129*  BUN 11  CREATININE 0.57  CALCIUM 8.1*   Liver Function Tests: No results found for this basename: AST, ALT, ALKPHOS, BILITOT, PROT, ALBUMIN,  in the last 168 hours No results found for this basename: LIPASE, AMYLASE,  in the last 168 hours No results found for this basename: AMMONIA,  in the last 168 hours CBC:  Recent Labs Lab 05/13/13 0030  WBC 6.0  HGB 13.1  HCT 38.8  MCV 87.6  PLT 119*   Cardiac Enzymes: No results found for this  basename: CKTOTAL, CKMB, CKMBINDEX, TROPONINI,  in the last 168 hours BNP (last 3 results) No results found for this basename: PROBNP,  in the last 8760 hours CBG: No results found for this basename: GLUCAP,  in the last 168 hours  No results found for this or any previous visit (from the past 240 hour(s)).   Studies: No results found.  Scheduled Meds: . pantoprazole (PROTONIX) IV  40 mg Intravenous Q12H   Continuous Infusions: . sodium chloride      Principal Problem:   Intertrochanteric fracture of left hip    Time spent: 25 minutes.     Heather Fitzgerald  [facility] Pager [phone]. If 7PM-7AM, please contact night-coverage at [email or web address], password [facility] 05/13/2013, 11:52 AM  LOS: 1 day

## 2013-05-13 NOTE — Interval H&P Note (Signed)
History and Physical Interval Note:  05/13/2013 4:51 PM  Heather Fitzgerald  has presented today for surgery, with the diagnosis of /  The various methods of treatment have been discussed with the patient and family. After consideration of risks, benefits and other options for treatment, the patient has consented to  Procedure(s): ARTHROPLASTY BIPOLAR HIP (Left) as a surgical intervention .  The patient's history has been reviewed, patient examined, no change in status, stable for surgery.  I have reviewed the patient's chart and labs.  Questions were answered to the patient's satisfaction.     [name],STEVEN R

## 2013-05-13 NOTE — Progress Notes (Signed)
INITIAL NUTRITION ASSESSMENT  DOCUMENTATION CODES Per approved criteria  -Not Applicable   INTERVENTION: 1. Advance diet per MD.  2. RD will monitor intake and add nutrition interventions as needed.   NUTRITION DIAGNOSIS: Inadequate oral intake related to inability to eat as evidenced by NPO status.  Goal: Patient will meet >/=90% of estimated nutrition needs.  Monitor:  Diet advancement, PO intake, weights, labs, I/O's  Reason for Assessment: Consult, Hip fracture protocol  79 y.o. female  Admitting Dx: Intertrochanteric fracture of left hip  ASSESSMENT: Previously healthy patient with a left intertrochanteric hip fracture s/p fall, awaiting likely surgical repair. She is currently NPO, but reports a good intake prior to admission.   Height: Ht Readings from Last 1 Encounters:  05/12/13 5\' 2"  (1.575 m)    Weight: Wt Readings from Last 1 Encounters:  05/12/13 131 lb 2.8 oz (59.5 kg)    Ideal Body Weight: 110 pounds  % Ideal Body Weight: 119%  Wt Readings from Last 10 Encounters:  05/12/13 131 lb 2.8 oz (59.5 kg)    Usual Body Weight: 130-135 pounds  % Usual Body Weight: 100%  BMI:  Body mass index is 23.99 kg/(m^2). Patient is normal weight.   Estimated Nutritional Needs: Kcal: 1500-1650 Protein: 70-85 g Fluid: >1.8 L/d  Skin: Intact  Diet Order: NPO  EDUCATION NEEDS: -No education needs identified at this time   Intake/Output Summary (Last 24 hours) at 05/13/13 1444 Last data filed at 05/13/13 1344  Gross per 24 hour  Intake      0 ml  Output   1800 ml  Net  -1800 ml    Last BM: PTA   Labs:   Recent Labs Lab 05/13/13 0030  NA 137  K 3.7  CL 102  CO2 25  BUN 11  CREATININE 0.57  CALCIUM 8.1*  GLUCOSE 129*    CBG (last 3)  No results found for this basename: GLUCAP,  in the last 72 hours  Scheduled Meds: . pantoprazole (PROTONIX) IV  40 mg Intravenous Q12H    Continuous Infusions: . sodium chloride 75 mL/hr at 05/13/13  1211    No past medical history on file.  No past surgical history on file.  Linnell Fulling, RD, LDN Pager #: [phone] After-Hours Pager #: [phone]

## 2013-05-13 NOTE — Anesthesia Postprocedure Evaluation (Signed)
Anesthesia Post Note  Patient: Heather Fitzgerald  Procedure(s) Performed: Procedure(s) (LRB): ARTHROPLASTY BIPOLAR HIP (Left)  Anesthesia type: General  Patient location: PACU  Post pain: Pain level controlled  Post assessment: Patient's Cardiovascular Status Stable  Last Vitals:  Filed Vitals:   05/13/13 2150  BP: 161/76  Pulse: 77  Temp:   Resp: 16    Post vital signs: Reviewed and stable  Level of consciousness: alert  Complications: No apparent anesthesia complications

## 2013-05-13 NOTE — Brief Op Note (Signed)
05/12/2013 - 05/13/2013  9:12 PM  PATIENT:  Wakeelah S Ray  79 y.o. female  PRE-OPERATIVE DIAGNOSIS:  left hip fracture, displaced femoral neck fracture   POST-OPERATIVE DIAGNOSIS:  left hip fracture, displaced femoral neck fracture  PROCEDURE:  Procedure(s): ARTHROPLASTY BIPOLAR HIP (Left), [facility]  SURGEON:  Surgeon(s) and Role:    * Verlee Rossetti, MD - Primary  PHYSICIAN ASSISTANT:   ASSISTANTS: Thea Gist, PA-C   ANESTHESIA:   general  EBL:  Total I/O In: 1000 [I.V.:1000] Out: 200 [Urine:200]  BLOOD ADMINISTERED:none  DRAINS: none   LOCAL MEDICATIONS USED:  NONE  SPECIMEN:  No Specimen  DISPOSITION OF SPECIMEN:  N/A  COUNTS:  YES  TOURNIQUET:  * No tourniquets in log *  DICTATION: .Other Dictation: Dictation Number [id number]  PLAN OF CARE: Admit to inpatient   PATIENT DISPOSITION:  PACU - hemodynamically stable.   Delay start of Pharmacological VTE agent (>24hrs) due to surgical blood loss or risk of bleeding: no

## 2013-05-13 NOTE — Transfer of Care (Signed)
Immediate Anesthesia Transfer of Care Note  Patient: Heather Fitzgerald  Procedure(s) Performed: Procedure(s): ARTHROPLASTY BIPOLAR HIP (Left)  Patient Location: PACU  Anesthesia Type:General  Level of Consciousness: awake, oriented, sedated and patient cooperative  Airway & Oxygen Therapy: Patient Spontanous Breathing and Patient connected to nasal cannula oxygen  Post-op Assessment: Report given to PACU RN, Post -op Vital signs reviewed and stable and Patient moving all extremities X 4  Post vital signs: Reviewed and stable  Complications: No apparent anesthesia complications

## 2013-05-14 DIAGNOSIS — I10 Essential (primary) hypertension: Secondary | ICD-10-CM

## 2013-05-14 DIAGNOSIS — D696 Thrombocytopenia, unspecified: Secondary | ICD-10-CM

## 2013-05-14 DIAGNOSIS — K219 Gastro-esophageal reflux disease without esophagitis: Secondary | ICD-10-CM

## 2013-05-14 LAB — BASIC METABOLIC PANEL
BUN: 11 mg/dL (ref 6–23)
CO2: 27 mEq/L (ref 19–32)
Calcium: 8 mg/dL — ABNORMAL LOW (ref 8.4–10.5)
Chloride: 104 mEq/L (ref 96–112)
Creatinine, Ser: 0.55 mg/dL (ref 0.50–1.10)
GFR calc Af Amer: >90 mL/min (ref >90)
GFR calc non Af Amer: 89 mL/min — ABNORMAL LOW (ref >90)
Sodium: 140 mEq/L (ref 135–145)

## 2013-05-14 LAB — CBC
HCT: 35.6 % — ABNORMAL LOW (ref 36.0–46.0)
Hemoglobin: 12.7 g/dL (ref 12.0–15.0)
MCH: 29.9 pg (ref 26.0–34.0)
MCHC: 34.8 g/dL (ref 30.0–36.0)
MCV: 86.4 fL (ref 78.0–100.0)
MCV: 87.1 fL (ref 78.0–100.0)
Platelets: 110 10*3/uL — ABNORMAL LOW (ref 150–400)
Platelets: 136 10*3/uL — ABNORMAL LOW (ref 150–400)
RBC: 4.12 MIL/uL (ref 3.87–5.11)
RBC: 4.19 MIL/uL (ref 3.87–5.11)
RDW: 13.9 % (ref 11.5–15.5)
WBC: 5.7 10*3/uL (ref 4.0–10.5)
WBC: 5.5 10*3/uL (ref 4.0–10.5)

## 2013-05-14 LAB — CREATININE, SERUM
GFR calc Af Amer: >90 mL/min (ref >90)
GFR calc non Af Amer: >90 mL/min (ref >90)

## 2013-05-14 MED ORDER — ALPRAZOLAM 0.25 MG PO TABS
0.2500 mg | ORAL_TABLET | Freq: Every day | ORAL | Status: DC | PRN
  Administered 2013-05-14 – 2013-05-15 (×2): 0.25 mg via ORAL
  Filled 2013-05-14 (×2): qty 1

## 2013-05-14 MED ORDER — SENNOSIDES-DOCUSATE SODIUM 8.6-50 MG PO TABS
1.0000 | ORAL_TABLET | Freq: Two times a day (BID) | ORAL | Status: DC
  Administered 2013-05-14 – 2013-05-16 (×5): 1 via ORAL
  Filled 2013-05-14 (×5): qty 1

## 2013-05-14 MED ORDER — MORPHINE SULFATE 2 MG/ML IJ SOLN: 0.5000 mg | INTRAMUSCULAR | Status: DC | PRN

## 2013-05-14 MED ORDER — BIOTENE DRY MOUTH MT LIQD: 15.0000 mL | OROMUCOSAL | Status: DC | PRN

## 2013-05-14 NOTE — Progress Notes (Signed)
Subjective: 1 Day Post-Op Procedure(s) (LRB): ARTHROPLASTY BIPOLAR HIP (Left) Patient reports pain as mild.  Up[ with PT this morning.  Foley out.  Objective: Vital signs in last 24 hours: Temp:  [97.6 F (36.4 C)-100.2 F (37.9 C)] 98.2 F (36.8 C) (10/04 0532) Pulse Rate:  [59-89] 75 (10/04 0532) Resp:  [16-22] 18 (10/04 0532) BP: (121-180)/(57-89) 140/70 mmHg (10/04 0532) SpO2:  [97 %-100 %] 100 % (10/04 0532)  Intake/Output from previous day: 10/03 0701 - 10/04 0700 In: 1500 [I.V.:1500] Out: 2300 [Urine:2300] Intake/Output this shift: Total I/O In: 482.5 [I.V.:482.5] Out: -    Recent Labs  05/13/13 0030 05/14/13 0030 05/14/13 0530  HGB 13.1 12.3 12.7    Recent Labs  05/14/13 0030 05/14/13 0530  WBC 5.7 5.5  RBC 4.12 4.19  HCT 35.6* 36.5  PLT 110* 136*    Recent Labs  05/13/13 0030 05/14/13 0030 05/14/13 0530  NA 137  --  140  K 3.7  --  4.5  CL 102  --  104  CO2 25  --  27  BUN 11  --  11  CREATININE 0.57 0.50 0.55  GLUCOSE 129*  --  139*  CALCIUM 8.1*  --  8.0*    Recent Labs  05/13/13 1518  INR 1.02    PE:  left hip incision dressed and dry.  NVI at left LE.  Assessment/Plan: 1 Day Post-Op Procedure(s) (LRB): ARTHROPLASTY BIPOLAR HIP (Left) Up with therapy  WBAT.  Post hip precautions.  Toni Arthurs 05/14/2013, 9:32 AM

## 2013-05-14 NOTE — Op Note (Signed)
NAMELEVY, WELLMAN NO.:  [id number]  MEDICAL RECORD NO.:  [id number]  LOCATION:  5N10C                        FACILITY:  [facility]  PHYSICIAN:  Almedia Balls. Ranell Patrick, M.D. DATE OF BIRTH:  [birth year]  DATE OF PROCEDURE:  05/13/2013 DATE OF DISCHARGE:                              OPERATIVE REPORT   PREOPERATIVE DIAGNOSIS:  Displaced left femoral neck fracture.  POSTOPERATIVE DIAGNOSIS:  Displaced left femoral neck fracture.  PROCEDURE PERFORMED:  Left hip hemiarthroplasty using DePuy Tri-Lock, prosthesis.  ATTENDING SURGEON:  Almedia Balls. Ranell Patrick, MD.  ASSISTANT:  Donnie Coffin. Dixon, PA-C who scrubbed the entire procedure, necessary for satisfactory completion of the surgery.  ANESTHESIA:  General anesthesia was used.  ESTIMATED BLOOD LOSS:  150 mL.  FLUID REPLACEMENT:  1200 mL crystalloids.  INSTRUMENT COUNTS:  Correct.  COMPLICATIONS:  There were no complications.  ANTIBIOTICS:  Perioperative antibiotics were given.  INDICATIONS:  The patient is a 79 year old female who suffered ground- level fall injuring her left hip.  The patient presented with a displaced femoral neck fracture for hemiarthroplasty.  An informed consent was obtained.  DESCRIPTION OF PROCEDURE:  After adequate level of anesthesia was achieved, the patient was positioned in the right lateral decubitus position with the left hip up.  Down leg padded appropriately.  An axillary roll utilized.  All neurovascular structures padded appropriately.  Left hip stabilized with a hip holder.  After sterile prep and drape of the left hip and leg, we performed a time-out.  We then performed a Kocher Langenbach incision, started at the posterior portion of the vastus ridge and extending back along the gluteus maximus.  Dissection down through subcutaneous tissues using Bovie, tensor fascia lata split in line with the tendons skin incision.  The short external rotators taken sharply off the posterior femur  with progressive internal rotation.  Piriformis released as well, and the capsule released.  We identified the freshly fractured femur.  No overtly pathologic features to this.  We first performed our neck cut one fingerbreadth above the lesser trochanter with a neck resection guide, used to mark our neck angle.  We then removed our femoral head and sized to a size 46 diameter femoral head.  We did a monopolar arthroplasty on this.  Next, we went and removed excess bony debridement fragments from the capsule.  We also removed the ligamentum teres.  We inspected the acetabulum, which was in good shape, good looking cartilage there.  We then went ahead and broached up to a size 3 femoral stem.  This was a Curator.  We impacted that with 20 degrees of anteversion.  We next went ahead and did a trial reduction with a standard offset with a +0 neck adapter and a 46 head.  We felt like we could go with probably the +5, just again a little bit more tension on the limb.  We went ahead and removed the trial components, thoroughly irrigated, and inserted.  The press-fit Tri Lock stem into place.  It was a size 3 stem, standard offset.  Once we had impacted that, then we felt like that was the same depth.  We selected the +  5 real neck adapter and a 46 monopolar head and impacted that in position, reduced the hip. Leg lengths were equal.  Nice stable hip at 90, 90 and leg on leg, and nice tension on the hip abductors.  We thoroughly irrigated the wound and then closed the capsule with interrupted #1 Ethibond suture followed by #1 Vicryl for the tensor fascia lata with interrupted figure-of-eight for the most distal portion and then we ran it proximally with a #1 Vicryl.  Then 0 and 2-0 layered subcutaneous closure in a running 4-0 Monocryl for skin.  Steri-Strips applied followed by sterile dressing. The patient tolerated the surgery well.     Almedia Balls. Ranell Patrick, M.D.     SRN/MEDQ   D:  05/13/2013  T:  05/14/2013  Job:  [id number]

## 2013-05-14 NOTE — Evaluation (Signed)
Occupational Therapy Evaluation Patient Details Name: Heather Fitzgerald MRN: [id number] DOB: 1934/01/23 Today's Date: 05/14/2013 Time: 1015-1030 OT Time Calculation (min): 15 min  OT Assessment / Plan / Recommendation History of present illness Fall resulting in Lt femur neck fx; S/p Lt THA    Clinical Impression   Pt admitted with above.  Will continue to follow acutely in order to address below problem list. Recommending SNF for d/c planning since pt lives alone and does not have 24/7 supervision/assist from family/friends.    OT Assessment  Patient needs continued OT Services    Follow Up Recommendations  SNF;Supervision/Assistance - 24 hour    Barriers to Discharge Decreased caregiver support does not have 24/7 supervision/assist  Equipment Recommendations  3 in 1 bedside comode    Recommendations for Other Services    Frequency  Min 2X/week    Precautions / Restrictions Precautions Precautions: Fall;Posterior Hip Precaution Comments: Educated pt on 3/3 posterior hip precautions Restrictions Weight Bearing Restrictions: Yes LLE Weight Bearing: Weight bearing as tolerated   Pertinent Vitals/Pain See vitals    ADL  Eating/Feeding: Performed;Independent Where Assessed - Eating/Feeding: Chair Upper Body Bathing: Simulated;Supervision/safety Where Assessed - Upper Body Bathing: Unsupported sitting Lower Body Bathing: Simulated;Moderate assistance Where Assessed - Lower Body Bathing: Supported sit to stand Upper Body Dressing: Performed;Set up Where Assessed - Upper Body Dressing: Unsupported sitting Lower Body Dressing: Performed;Maximal assistance Where Assessed - Lower Body Dressing: Supported sit to stand Toilet Transfer: Simulated;Minimal assistance Toilet Transfer Method: Sit to Barista:  (simulated at chair) Equipment Used: Gait belt;Rolling walker Transfers/Ambulation Related to ADLs: min assist with RW for sit<>stand ADL Comments:  Educated pt on hip precautions and discussed impact of these on LB ADLs.   Pt performed sit<>stand but c/o dizziness.  Stood ~3 minutes then returned to sitting (declined performing toilet transfer).    OT Diagnosis: Generalized weakness;Acute pain  OT Problem List: Decreased strength;Decreased activity tolerance;Impaired balance (sitting and/or standing);Decreased knowledge of use of DME or AE;Decreased knowledge of precautions;Pain OT Treatment Interventions: Self-care/ADL training;DME and/or AE instruction;Therapeutic activities;Patient/family education;Balance training   OT Goals(Current goals can be found in the care plan section) Acute Rehab OT Goals Patient Stated Goal: to get back to being independent  OT Goal Formulation: With patient Time For Goal Achievement: 05/21/13 Potential to Achieve Goals: Good  Visit Information  Last OT Received On: 05/14/13 Assistance Needed: +1 History of Present Illness: Fall resulting in Lt femur neck fx; S/p Lt THA        Prior Functioning     Home Living Family/patient expects to be discharged to:: Private residence Living Arrangements: Alone Available Help at Discharge: Friend(s);Available PRN/intermittently Type of Home: Other(Comment) Home Access: Level entry Home Layout: One level Prior Function Level of Independence: Independent Communication Communication: No difficulties Dominant Hand: Left         Vision/Perception     Cognition  Cognition Arousal/Alertness: Awake/alert Behavior During Therapy: WFL for tasks assessed/performed Overall Cognitive Status: Within Functional Limits for tasks assessed    Extremity/Trunk Assessment Upper Extremity Assessment Upper Extremity Assessment: Overall WFL for tasks assessed     Mobility Bed Mobility Bed Mobility: Not assessed Transfers Transfers: Sit to Stand;Stand to Sit Sit to Stand: 4: Min assist;From chair/3-in-1;With armrests;With upper extremity assist Stand to Sit: 4:  Min assist;To chair/3-in-1;With armrests;With upper extremity assist Details for Transfer Assistance: VCs for safe hand placement and technique. Assist for power up and to achieve full upright standing position.  Exercise     Balance Balance Balance Assessed: Yes Static Standing Balance Static Standing - Balance Support: Bilateral upper extremity supported Static Standing - Level of Assistance: 4: Min assist Static Standing - Comment/# of Minutes: ~3 minutes   End of Session OT - End of Session Equipment Utilized During Treatment: Rolling walker;Gait belt Activity Tolerance: Patient limited by fatigue Patient left: in chair;with call bell/phone within reach;with family/visitor present Nurse Communication: Mobility status  GO   05/14/2013 Cipriano Mile OTR/L Pager [phone] Office [phone]   Cipriano Mile 05/14/2013, 2:24 PM

## 2013-05-14 NOTE — Progress Notes (Signed)
[facility] PROGRESS NOTE  Heather Fitzgerald GNF:[id number] DOB: 06-28-1934 DOA: 05/12/2013 PCP: No primary provider on file.  Assessment/Plan:  1-Intertrochanteric fracture of left hip after a mechanical fall. EKG with sinus rhythm no ST changes. Patient S/P Left hip hemiarthroplasty using DePuy Tri-Lock, prosthesis. PT, per ortho. DVT prophylaxis; lovenox. Social worker consulted.   2-Hypertension : history of pre -hypertension. Continue to monitor. Will consider oral medications if continue to be elevated. Continue with PRN hydralazine.  3-GERD: improved. Continue with oral protonix.  4-Thrombocytopenia: improving.    Code Status: Full code.  Family Communication: Care discussed with patient.  Disposition Plan: remain in Hospital.    Consultants:  Dr Fontaine No.   Procedures:  none  Antibiotics:  none  HPI/Subjective: Feeling well this morning. No nausea. Ate a little of breakfast.   Objective: Filed Vitals:   05/14/13 0532  BP: 140/70  Pulse: 75  Temp: 98.2 F (36.8 C)  Resp: 18    Intake/Output Summary (Last 24 hours) at 05/14/13 0815 Last data filed at 05/14/13 0500  Gross per 24 hour  Intake   1500 ml  Output   2300 ml  Net   -800 ml   Filed Weights   05/12/13 2240  Weight: 59.5 kg (131 lb 2.8 oz)    Exam:   General:  No distress.   Cardiovascular: S 1, S 2 RRR  Respiratory: CTA  Abdomen: BS present, soft, nt  Musculoskeletal: no edema  Data Reviewed: Basic Metabolic Panel:  Recent Labs Lab 05/13/13 0030 05/14/13 0030 05/14/13 0530  NA 137  --  140  K 3.7  --  4.5  CL 102  --  104  CO2 25  --  27  GLUCOSE 129*  --  139*  BUN 11  --  11  CREATININE 0.57 0.50 0.55  CALCIUM 8.1*  --  8.0*   Liver Function Tests: No results found for this basename: AST, ALT, ALKPHOS, BILITOT, PROT, ALBUMIN,  in the last 168 hours No results found for this basename: LIPASE, AMYLASE,  in the last 168 hours No results found for this basename:  AMMONIA,  in the last 168 hours CBC:  Recent Labs Lab 05/13/13 0030 05/14/13 0030 05/14/13 0530  WBC 6.0 5.7 5.5  HGB 13.1 12.3 12.7  HCT 38.8 35.6* 36.5  MCV 87.6 86.4 87.1  PLT 119* 110* 136*   Cardiac Enzymes:  Recent Labs Lab 05/13/13 1130  TROPONINI <0.30   BNP (last 3 results) No results found for this basename: PROBNP,  in the last 8760 hours CBG: No results found for this basename: GLUCAP,  in the last 168 hours  Recent Results (from the past 240 hour(s))  MRSA PCR SCREENING     Status: None   Collection Time    05/13/13  4:20 PM      Result Value Range Status   MRSA by PCR NEGATIVE  NEGATIVE Final   Comment:            The GeneXpert MRSA Assay (FDA     approved for NASAL specimens     only), is one component of a     comprehensive MRSA colonization     surveillance program. It is not     intended to diagnose MRSA     infection nor to guide or     monitor treatment for     MRSA infections.     Studies: Dg Pelvis Portable  05/13/2013   CLINICAL DATA:  Postop left  hip  EXAM: PORTABLE PELVIS  COMPARISON:  None.  FINDINGS: Status post bipolar left hip hemiarthroplasty. The prosthesis is located. No evidence of periprosthetic fracture. Expected soft tissue gas in the operative region.  The proximal right femur and the pelvic ring are intact.  IMPRESSION: No adverse features related to left hip hemiarthroplasty.   Electronically Signed   By: Tiburcio Pea M.D.   On: 05/13/2013 22:44    Scheduled Meds: . enoxaparin (LOVENOX) injection  40 mg Subcutaneous Q24H  . estrogens (conjugated)  0.625 mg Oral Daily  . ferrous sulfate  325 mg Oral TID PC  . loteprednol  2 drop Left Eye BID  . pantoprazole  40 mg Oral Daily   Continuous Infusions: . sodium chloride 50 mL/hr at 05/13/13 2240    Principal Problem:   Intertrochanteric fracture of left hip Active Problems:   Essential hypertension, benign   Thrombocytopenia, unspecified   GERD (gastroesophageal  reflux disease)    Time spent: 25 minutes.     Roey Coopman  [facility] Pager [phone]. If 7PM-7AM, please contact night-coverage at [email or web address], password [facility] 05/14/2013, 8:15 AM  LOS: 2 days

## 2013-05-14 NOTE — Care Management Note (Signed)
    Page 1 of 1   05/14/2013     5:01:46 PM   CARE MANAGEMENT NOTE 05/14/2013  Patient:  Heather Fitzgerald, Heather Fitzgerald   Account Number:  [id number]  Date Initiated:  05/14/2013  Documentation initiated by:  [facility]  Subjective/Objective Assessment:   adm: Intertrochanteric fracture of left hip     Action/Plan:   discharge planning   Anticipated DC Date:  05/16/2013   Anticipated DC Plan:  SKILLED NURSING FACILITY  In-house referral  Clinical Social Worker      DC Planning Services  CM consult      Choice offered to / List presented to:             Status of service:  Completed, signed off Medicare Important Message given?   (If response is "NO", the following Medicare IM given date fields will be blank) Date Medicare IM given:   Date Additional Medicare IM given:    Discharge Disposition:    Per UR Regulation:    If discussed at Long Length of Stay Meetings, dates discussed:    Comments:  05/14/13 CM cnotes PT/OT recc. SNF.  CSW called with PT recc.  No other CM needs were communicated.  Freddy Jaksch, BSN, CM [phone].

## 2013-05-14 NOTE — Evaluation (Signed)
Physical Therapy Evaluation Patient Details Name: Heather Fitzgerald MRN: [id number] DOB: 1933/11/10 Today's Date: 05/14/2013 Time: 4098-1191 PT Time Calculation (min): 21 min  PT Assessment / Plan / Recommendation History of Present Illness  Fall resulting in Lt femur neck fx; S/p Lt THA   Clinical Impression  Pt is s/p Lt THA due to fall and displaced femoral neck fx resulting in the deficits listed below (see PT Problem List). Pt will benefit from skilled PT to increase their independence and safety with mobility to allow discharge to the venue listed below. Pt limited in amb today due to dizziness. Was min (A) for mobility and transfers. Pt lives alone and would benefit from [facility] when D/C to increase independence and reach Mod I level.      PT Assessment  Patient needs continued PT services    Follow Up Recommendations  SNF;Supervision/Assistance - 24 hour    Does the patient have the potential to tolerate intense rehabilitation      Barriers to Discharge Decreased caregiver support pt lives alone    Equipment Recommendations  Other (comment) (TBD at rehab)    Recommendations for Other Services OT consult   Frequency Min 3X/week    Precautions / Restrictions Precautions Precautions: Fall;Posterior Hip Precaution Booklet Issued: Yes (comment) Precaution Comments: given posterior hip precautions sheet; awaiting MD orders to confirm she has precautions  (had fall in 2009) Restrictions Weight Bearing Restrictions: Yes LLE Weight Bearing: Weight bearing as tolerated   Pertinent Vitals/Pain 5-6/10; pt premedicated       Mobility  Bed Mobility Bed Mobility: Supine to Sit;Sitting - Scoot to Edge of Bed Supine to Sit: 4: Min assist;HOB elevated;With rails Sitting - Scoot to Edge of Bed: 4: Min assist Details for Bed Mobility Assistance: (A) to advance Lt LE to/off EOB; incr time due to pain; cues for hand placement and sequencing Transfers Transfers: Sit to Stand;Stand to  Sit Sit to Stand: 4: Min assist;From bed;With upper extremity assist Stand to Sit: 4: Min assist;To chair/3-in-1;With armrests Details for Transfer Assistance: (A) to achieve upright position; cues for hand placement and safety  Ambulation/Gait Ambulation/Gait Assistance: 4: Min assist Ambulation Distance (Feet): 6 Feet (with pivotal step ) Assistive device: Rolling walker Ambulation/Gait Assistance Details: cues for gt sequencing and safety with RW; pt limited in amb due to c/o dizziniess Gait Pattern: Step-to pattern;Decreased stance time - left;Decreased step length - right;Trunk flexed;Narrow base of support Gait velocity: decreased due to pain and dizziness Stairs: No Wheelchair Mobility Wheelchair Mobility: No    Exercises Total Joint Exercises Ankle Circles/Pumps: AROM;Both;10 reps;Supine Long Arc Quad: AAROM;Left;5 reps;Seated;Other (comment) (limited by pain)   PT Diagnosis: Difficulty walking;Acute pain  PT Problem List: Decreased strength;Decreased range of motion;Decreased balance;Decreased mobility;Decreased activity tolerance;Decreased knowledge of use of DME;Decreased knowledge of precautions;Pain PT Treatment Interventions: DME instruction;Gait training;Functional mobility training;Therapeutic exercise;Therapeutic activities;Balance training;Neuromuscular re-education;Patient/family education     PT Goals(Current goals can be found in the care plan section) Acute Rehab PT Goals Patient Stated Goal: to get back to being independent  PT Goal Formulation: With patient Potential to Achieve Goals: Good  Visit Information  Last PT Received On: 05/14/13 Assistance Needed: +1 History of Present Illness: Fall resulting in Lt femur neck fx; S/p Lt THA        Prior Functioning  Home Living Family/patient expects to be discharged to:: Private residence Living Arrangements: Alone Available Help at Discharge: Friend(s);Available PRN/intermittently Type of Home:  Other(Comment) (townhome ) Home Access: Level entry Home Layout:  One level Additional Comments: has walk in shower; has seat she can use in shower; has elevated toilet seat  Prior Function Level of Independence: Independent Communication Communication: No difficulties Dominant Hand: Left    Cognition  Cognition Arousal/Alertness: Awake/alert Behavior During Therapy: WFL for tasks assessed/performed Overall Cognitive Status: Within Functional Limits for tasks assessed    Extremity/Trunk Assessment Upper Extremity Assessment Upper Extremity Assessment: Defer to OT evaluation Lower Extremity Assessment Lower Extremity Assessment: LLE deficits/detail LLE: Unable to fully assess due to pain LLE Sensation:  (WFL to light touch ) Cervical / Trunk Assessment Cervical / Trunk Assessment: Normal   Balance Balance Balance Assessed: Yes Static Sitting Balance Static Sitting - Balance Support: Bilateral upper extremity supported;Feet supported Static Sitting - Level of Assistance: 5: Stand by assistance Static Sitting - Comment/# of Minutes: pt tolerated sitting EOB ~ 5 min; c/o "little dizziniess"; deep breathing exercises to reduce dizziness  End of Session PT - End of Session Equipment Utilized During Treatment: Gait belt Activity Tolerance: Other (comment) (pt c/o dizziniess with amb) Patient left: in chair;with call bell/phone within reach Nurse Communication: Mobility status;Other (comment) (O2 at 96% on RA)  GP     Shelva Majestic [place], [place] [phone] 05/14/2013, 10:05 AM

## 2013-05-15 ENCOUNTER — Encounter (HOSPITAL_COMMUNITY): Payer: Self-pay | Admitting: Orthopedic Surgery

## 2013-05-15 LAB — CBC
HCT: 32.5 % — ABNORMAL LOW (ref 36.0–46.0)
Hemoglobin: 10.9 g/dL — ABNORMAL LOW (ref 12.0–15.0)
MCHC: 33.5 g/dL (ref 30.0–36.0)
MCV: 88.8 fL (ref 78.0–100.0)
RBC: 3.66 MIL/uL — ABNORMAL LOW (ref 3.87–5.11)
RDW: 14.3 % (ref 11.5–15.5)

## 2013-05-15 NOTE — Progress Notes (Signed)
[facility] PROGRESS NOTE  CLYDE UPSHAW WUJ:[id number] DOB: February 26, 1934 DOA: 05/12/2013 PCP: No primary provider on file.  Assessment/Plan:  1-Intertrochanteric fracture of left hip after a mechanical fall. EKG with sinus rhythm no ST changes. Patient S/P Left hip hemiarthroplasty using DePuy Tri-Lock, prosthesis. PT, per ortho. DVT prophylaxis; lovenox. Social worker consulted.   2-Hypertension : history of pre -hypertension.  Continue with PRN hydralazine. BP trending down.   3-GERD: improved. Continue with oral protonix.  4-Thrombocytopenia: improving.  5-Anemia Acute blood loss post surgery> expected. Hb decrease to 10. Repeat in am. Asymptomatic    Code Status: Full code.  Family Communication: Care discussed with patient.  Disposition Plan: remain in Hospital.    Consultants:  Dr Fontaine No.   Procedures:  none  Antibiotics:  none  HPI/Subjective: Feeling well this morning. No nausea. Had a bowel movement. Agree to go to SNF.   Objective: Filed Vitals:   05/15/13 0611  BP: 120/61  Pulse: 81  Temp: 97.9 F (36.6 C)  Resp: 16    Intake/Output Summary (Last 24 hours) at 05/15/13 1023 Last data filed at 05/15/13 0730  Gross per 24 hour  Intake   1080 ml  Output      0 ml  Net   1080 ml   Filed Weights   05/12/13 2240  Weight: 59.5 kg (131 lb 2.8 oz)    Exam:   General:  No distress.   Cardiovascular: S 1, S 2 RRR  Respiratory: CTA  Abdomen: BS present, soft, nt  Musculoskeletal: no edema  Data Reviewed: Basic Metabolic Panel:  Recent Labs Lab 05/13/13 0030 05/14/13 0030 05/14/13 0530  NA 137  --  140  K 3.7  --  4.5  CL 102  --  104  CO2 25  --  27  GLUCOSE 129*  --  139*  BUN 11  --  11  CREATININE 0.57 0.50 0.55  CALCIUM 8.1*  --  8.0*   Liver Function Tests: No results found for this basename: AST, ALT, ALKPHOS, BILITOT, PROT, ALBUMIN,  in the last 168 hours No results found for this basename: LIPASE, AMYLASE,  in the  last 168 hours No results found for this basename: AMMONIA,  in the last 168 hours CBC:  Recent Labs Lab 05/13/13 0030 05/14/13 0030 05/14/13 0530 05/15/13 0715  WBC 6.0 5.7 5.5 5.3  HGB 13.1 12.3 12.7 10.9*  HCT 38.8 35.6* 36.5 32.5*  MCV 87.6 86.4 87.1 88.8  PLT 119* 110* 136* 131*   Cardiac Enzymes:  Recent Labs Lab 05/13/13 1130  TROPONINI <0.30   BNP (last 3 results) No results found for this basename: PROBNP,  in the last 8760 hours CBG: No results found for this basename: GLUCAP,  in the last 168 hours  Recent Results (from the past 240 hour(s))  MRSA PCR SCREENING     Status: None   Collection Time    05/13/13  4:20 PM      Result Value Range Status   MRSA by PCR NEGATIVE  NEGATIVE Final   Comment:            The GeneXpert MRSA Assay (FDA     approved for NASAL specimens     only), is one component of a     comprehensive MRSA colonization     surveillance program. It is not     intended to diagnose MRSA     infection nor to guide or     monitor treatment for  MRSA infections.     Studies: Dg Pelvis Portable  05/13/2013   CLINICAL DATA:  Postop left hip  EXAM: PORTABLE PELVIS  COMPARISON:  None.  FINDINGS: Status post bipolar left hip hemiarthroplasty. The prosthesis is located. No evidence of periprosthetic fracture. Expected soft tissue gas in the operative region.  The proximal right femur and the pelvic ring are intact.  IMPRESSION: No adverse features related to left hip hemiarthroplasty.   Electronically Signed   By: Tiburcio Pea M.D.   On: 05/13/2013 22:44    Scheduled Meds: . enoxaparin (LOVENOX) injection  40 mg Subcutaneous Q24H  . estrogens (conjugated)  0.625 mg Oral Daily  . ferrous sulfate  325 mg Oral TID PC  . loteprednol  2 drop Left Eye BID  . pantoprazole  40 mg Oral Daily  . senna-docusate  1 tablet Oral BID   Continuous Infusions:    Principal Problem:   Intertrochanteric fracture of left hip Active Problems:    Essential hypertension, benign   Thrombocytopenia, unspecified   GERD (gastroesophageal reflux disease)    Time spent: 25 minutes.     REGALADO,BELKYS  [facility] Pager [phone]. If 7PM-7AM, please contact night-coverage at [email or web address], password [facility] 05/15/2013, 10:23 AM  LOS: 3 days

## 2013-05-15 NOTE — Progress Notes (Signed)
Clinical Social Work Department BRIEF PSYCHOSOCIAL ASSESSMENT 05/15/2013  Patient:  Heather Fitzgerald, Heather Fitzgerald     Account Number:  [id number]     Admit date:  05/12/2013  Clinical Social Worker:  Hendricks Milo  Date/Time:  05/15/2013 04:16 PM  Referred by:  Physician  Date Referred:  05/15/2013 Referred for  SNF Placement   Other Referral:   Interview type:  Patient Other interview type:    PSYCHOSOCIAL DATA Living Status:  ALONE Admitted from facility:   Level of care:   Primary support name:  Clista Bernhardt Primary support relationship to patient:  FRIEND Degree of support available:   Very supportive per patient.    CURRENT CONCERNS  Other Concerns:    SOCIAL WORK ASSESSMENT / PLAN Clinical Social Worker (CSW) spoke with patient about SNF placement. Patient was agreeable to SNF search in [facility]. Patient's first choice is [facility]. CSW faxed out to [facility] and [facility].   Assessment/plan status:  Psychosocial Support/Ongoing Assessment of Needs Other assessment/ plan:   Information/referral to community resources:   CSW gave SNF list.    PATIENT'S/FAMILY'S RESPONSE TO PLAN OF CARE: Patient was appreciative of Child psychotherapist and agreeable to SNF search in [place]. [facility] is patient's first choice.

## 2013-05-15 NOTE — Progress Notes (Signed)
Clinical Social Work Department CLINICAL SOCIAL WORK PLACEMENT NOTE 05/15/2013  Patient:  EBONE, ALCIVAR  Account Number:  [id number] Admit date:  05/12/2013  Clinical Social Worker:  Jetta Lout, Theresia Majors  Date/time:  05/15/2013 04:29 PM  Clinical Social Work is seeking post-discharge placement for this patient at the following level of care:   SKILLED NURSING   (*CSW will update this form in Epic as items are completed)   05/15/2013  Patient/family provided with [facility] Department of Clinical Social Work's list of facilities offering this level of care within the geographic area requested by the patient (or if unable, by the patient's family).  05/15/2013  Patient/family informed of their freedom to choose among providers that offer the needed level of care, that participate in Medicare, Medicaid or managed care program needed by the patient, have an available bed and are willing to accept the patient.  05/15/2013  Patient/family informed of [facility]' ownership interest in [facility], as well as of the fact that they are under no obligation to receive care at this facility.  PASARR submitted to EDS on 05/15/2013 PASARR number received from EDS on 05/15/2013  FL2 transmitted to all facilities in geographic area requested by pt/family on  05/15/2013 FL2 transmitted to all facilities within larger geographic area on   Patient informed that his/her managed care company has contracts with or will negotiate with  certain facilities, including the following:     Patient/family informed of bed offers received:   Patient chooses bed at  Physician recommends and patient chooses bed at    Patient to be transferred to  on   Patient to be transferred to facility by   The following physician request were entered in Epic:   Additional Comments: Patient agreeable to SNF search in [facility] and [facility] is their first choice.

## 2013-05-15 NOTE — Progress Notes (Signed)
Orthopedics Progress Note  Subjective: I feel better today.  Hip hurts some.  Objective:  Filed Vitals:   05/15/13 0611  BP: 120/61  Pulse: 81  Temp: 97.9 F (36.6 C)  Resp: 16    General: Awake and alert  Musculoskeletal: Left hip incision clean and dry and intact.  Mepilex dressing reapplied.  Not overly moist.Neurovascularly intact  Lab Results  Component Value Date   WBC 5.3 05/15/2013   HGB 10.9* 05/15/2013   HCT 32.5* 05/15/2013   MCV 88.8 05/15/2013   PLT 131* 05/15/2013       Component Value Date/Time   NA 140 05/14/2013 0530   K 4.5 05/14/2013 0530   CL 104 05/14/2013 0530   CO2 27 05/14/2013 0530   GLUCOSE 139* 05/14/2013 0530   BUN 11 05/14/2013 0530   CREATININE 0.55 05/14/2013 0530   CALCIUM 8.0* 05/14/2013 0530   GFRNONAA 89* 05/14/2013 0530   GFRAA >90 05/14/2013 0530    Lab Results  Component Value Date   INR 1.02 05/13/2013    Assessment/Plan: POD #2 s/p Procedure(s): ARTHROPLASTY BIPOLAR HIP Patient making remarkable progress.  Continue PT,OT D/C planning WBAT on the left LE DVT prophylaxis mechanical and Lovenox D/C planning - Short term SNF  Almedia Balls. Ranell Patrick, MD 05/15/2013 11:18 AM

## 2013-05-16 LAB — CBC
MCH: 29.9 pg (ref 26.0–34.0)
MCHC: 33.9 g/dL (ref 30.0–36.0)
MCV: 88.3 fL (ref 78.0–100.0)
Platelets: 131 10*3/uL — ABNORMAL LOW (ref 150–400)
RBC: 3.24 MIL/uL — ABNORMAL LOW (ref 3.87–5.11)

## 2013-05-16 MED ORDER — SENNOSIDES-DOCUSATE SODIUM 8.6-50 MG PO TABS: 1.0000 | ORAL_TABLET | Freq: Two times a day (BID) | ORAL | Status: DC

## 2013-05-16 MED ORDER — TRAMADOL HCL 50 MG PO TABS: 50.0000 mg | ORAL_TABLET | Freq: Four times a day (QID) | ORAL | Status: DC | PRN

## 2013-05-16 MED ORDER — ALPRAZOLAM 0.5 MG PO TABS: 0.2500 mg | ORAL_TABLET | Freq: Every day | ORAL | Status: DC | PRN

## 2013-05-16 MED ORDER — FERROUS SULFATE 325 (65 FE) MG PO TABS: 325.0000 mg | ORAL_TABLET | Freq: Three times a day (TID) | ORAL | Status: DC

## 2013-05-16 NOTE — Discharge Summary (Signed)
Physician Discharge Summary  Heather Fitzgerald BJY:[id number] DOB: Sep 07, 1933 DOA: 05/12/2013  PCP: No primary provider on file.  Admit date: 05/12/2013 Discharge date: 05/16/2013  Time spent: 35 minutes  Recommendations for Outpatient Follow-up:  1. Need CBC to follow Hb level. 2. Needs to follow up with D norris post op.   Discharge Diagnoses:    Intertrochanteric fracture of left hip   Anemia Acute blood loss post surgery.    Essential hypertension, benign   Thrombocytopenia, unspecified   GERD (gastroesophageal reflux disease)   Discharge Condition: Stable.   Diet recommendation: Heart Healthy  Filed Weights   05/12/13 2240  Weight: 59.5 kg (131 lb 2.8 oz)    History of present illness:  Heather Fitzgerald is a 79 y.o. female previously healthy, who suffered a mechanical fall while walking on a friends deck and missed a step down (didnt know that a step was there). She fell landing on her L side. After the fall she was unable to bear weight due to severe pain in her L hip. Pain better with rest, worse with movement. She was taken to [facility] where a L hip fracture was diagnosed. The patient was then transferred to [facility].   Hospital Course:  Patient admitted with left intertrochanteric hip fracture after a mechanical fall. She underwent Left hip hemiarthroplasty using DePuy Tri-Lock, prosthesis on 10-04. She had acute blood loss anemia post op. Her Hb has remain stable at 9.7. She was started on iron supplement. Lovenox will be prescribe for DVT prophylaxis.   1-Intertrochanteric fracture of left hip after a mechanical fall. EKG with sinus rhythm no ST changes. Patient S/P Left hip hemiarthroplasty using DePuy Tri-Lock, prosthesis. PT, per ortho. DVT prophylaxis; lovenox.  2-Hypertension : history of pre -hypertension. Continue with PRN hydralazine. BP normalized.  3-GERD: improved. Continue with oral protonix.  4-Thrombocytopenia: improving.  5-Anemia Acute blood loss post  surgery> expected. Hb decrease to 10. Hb today at 9.7.  Asymptomatic    Procedures: S/P Left hip hemiarthroplasty using DePuy Tri-Lock, prosthesis 10-04   Consultations:  Dr Ranell Patrick  Discharge Exam: Filed Vitals:   05/16/13 0537  BP: 117/53  Pulse: 83  Temp: 99.8 F (37.7 C)  Resp: 14    General: no distress.  Cardiovascular: S 1, S 2 RRR Respiratory: CTA  Discharge Instructions  Discharge Orders   Future Orders Complete By Expires   Diet - low sodium heart healthy  As directed    Increase activity slowly  As directed    Weight bearing as tolerated  As directed    Comments:     left       Medication List         acetaminophen 325 MG tablet  Commonly known as:  TYLENOL  Take 2 tablets (650 mg total) by mouth every 6 (six) hours as needed for pain.     ALPRAZolam 0.5 MG tablet  Commonly known as:  XANAX  Take 0.5 tablets (0.25 mg total) by mouth daily as needed for sleep or anxiety.     cyclobenzaprine 10 MG tablet  Commonly known as:  FLEXERIL  Take 10 mg by mouth 3 (three) times daily as needed for muscle spasms.     enoxaparin 40 MG/0.4ML injection  Commonly known as:  LOVENOX  Inject 0.4 mLs (40 mg total) into the skin daily. 30 days post op     estrogens conjugated (synthetic A) 0.625 MG tablet  Commonly known as:  CENESTIN  Take 0.625  mg by mouth daily.     ferrous sulfate 325 (65 FE) MG tablet  Take 1 tablet (325 mg total) by mouth 3 (three) times daily after meals.     loteprednol 0.5 % ophthalmic suspension  Commonly known as:  LOTEMAX  Place 2 drops into the left eye 2 (two) times daily.     omeprazole 20 MG capsule  Commonly known as:  PRILOSEC  Take 20 mg by mouth daily as needed (acid reflux).     PRESERVISION AREDS 2 PO  Take 1 capsule by mouth 2 (two) times daily.     senna-docusate 8.6-50 MG per tablet  Commonly known as:  Senokot-S  Take 1 tablet by mouth 2 (two) times daily.     traMADol 50 MG tablet  Commonly known as:   ULTRAM  Take 1 tablet (50 mg total) by mouth every 6 (six) hours as needed for pain.       Allergies  Allergen Reactions  . Iohexol      Code: HIVES, Desc: (+) SKIN TEST 75YRS AGO, OK W/ 1VP 15 YRS AGO, TODAY W/ HIVES, 50MG  BENADRYL PO.Marland KitchenPT OK, SUGGESTED 13 PRE MEDS FOR FUTURE IV CONTRAST PER DR.MATTERN//A.C.PT CALLS [place] 2 DAYS POST INJ, SHE HAS BEEN NAUSEATED SINCE EXAM & HAS STARTED TO ITCH AGAIN,,, WE RE, Onset Date: [id number]   . Sulfa Antibiotics Nausea Only       Follow-up Information   Follow up with NORRIS,STEVEN R, MD. Call in 2 weeks. [phone])    Specialty:  Orthopedic Surgery   Contact information:   [address] [place] [address] [phone]        The results of significant diagnostics from this hospitalization (including imaging, microbiology, ancillary and laboratory) are listed below for reference.    Significant Diagnostic Studies: Dg Pelvis Portable  05/13/2013   CLINICAL DATA:  Postop left hip  EXAM: PORTABLE PELVIS  COMPARISON:  None.  FINDINGS: Status post bipolar left hip hemiarthroplasty. The prosthesis is located. No evidence of periprosthetic fracture. Expected soft tissue gas in the operative region.  The proximal right femur and the pelvic ring are intact.  IMPRESSION: No adverse features related to left hip hemiarthroplasty.   Electronically Signed   By: Tiburcio Pea M.D.   On: 05/13/2013 22:44    Microbiology: Recent Results (from the past 240 hour(s))  MRSA PCR SCREENING     Status: None   Collection Time    05/13/13  4:20 PM      Result Value Range Status   MRSA by PCR NEGATIVE  NEGATIVE Final   Comment:            The GeneXpert MRSA Assay (FDA     approved for NASAL specimens     only), is one component of a     comprehensive MRSA colonization     surveillance program. It is not     intended to diagnose MRSA     infection nor to guide or     monitor treatment for     MRSA infections.     Labs: Basic  Metabolic Panel:  Recent Labs Lab 05/13/13 0030 05/14/13 0030 05/14/13 0530  NA 137  --  140  K 3.7  --  4.5  CL 102  --  104  CO2 25  --  27  GLUCOSE 129*  --  139*  BUN 11  --  11  CREATININE 0.57 0.50 0.55  CALCIUM 8.1*  --  8.0*  CBC:  Recent Labs Lab 05/13/13 0030 05/14/13 0030 05/14/13 0530 05/15/13 0715 05/16/13 0555  WBC 6.0 5.7 5.5 5.3 5.0  HGB 13.1 12.3 12.7 10.9* 9.7*  HCT 38.8 35.6* 36.5 32.5* 28.6*  MCV 87.6 86.4 87.1 88.8 88.3  PLT 119* 110* 136* 131* 131*   Cardiac Enzymes:  Recent Labs Lab 05/13/13 1130  TROPONINI <0.30   BNP: BNP (last 3 results) No results found for this basename: PROBNP,  in the last 8760 hours CBG: No results found for this basename: GLUCAP,  in the last 168 hours     Signed:  Rasheena Talmadge  [facility] 05/16/2013, 7:39 AM

## 2013-05-16 NOTE — Progress Notes (Signed)
   Subjective: 3 Days Post-Op Procedure(s) (LRB): ARTHROPLASTY BIPOLAR HIP (Left)  Pt c/o mild pain but overall doing well Walked with therapy today Improving steadily Patient reports pain as mild.  Objective:   VITALS:   Filed Vitals:   05/16/13 0800  BP:   Pulse:   Temp:   Resp: 16    Left hip incision healing well nv intact distally No rashes or edema  LABS  Recent Labs  05/14/13 0530 05/15/13 0715 05/16/13 0555  HGB 12.7 10.9* 9.7*  HCT 36.5 32.5* 28.6*  WBC 5.5 5.3 5.0  PLT 136* 131* 131*     Recent Labs  05/14/13 0030 05/14/13 0530  NA  --  140  K  --  4.5  BUN  --  11  CREATININE 0.50 0.55  GLUCOSE  --  139*     Assessment/Plan: 3 Days Post-Op Procedure(s) (LRB): ARTHROPLASTY BIPOLAR HIP (Left)  Continue PT/OT weight bearing as tolerated May d/c to SNF when bed available and medically cleared Follow up with Dr. Ranell Patrick in 2 weeks Overall pt doing well   Alphonsa Overall, MPAS, PA-C  05/16/2013, 10:51 AM

## 2013-05-16 NOTE — Progress Notes (Signed)
Physical Therapy Treatment Patient Details Name: Heather Fitzgerald MRN: [id number] DOB: 11/09/1933 Today's Date: 05/16/2013 Time: 0865-7846 PT Time Calculation (min): 24 min  PT Assessment / Plan / Recommendation  History of Present Illness Fall resulting in Lt femur neck fx; S/p Lt THA    PT Comments   Pt is progressing well with therapy. Requires min cues to maintain hip precautions. Will benefit form STSNF upon acute D/C to incr independence and mobility before returning home where she lives alone.  Follow Up Recommendations  SNF;Supervision/Assistance - 24 hour     Does the patient have the potential to tolerate intense rehabilitation     Barriers to Discharge        Equipment Recommendations  Other (comment)    Recommendations for Other Services    Frequency Min 3X/week   Progress towards PT Goals Progress towards PT goals: Progressing toward goals  Plan Current plan remains appropriate    Precautions / Restrictions Precautions Precautions: Fall;Posterior Hip Precaution Comments: pt able to recall 2/3 precautions; cues during session to adhere  Restrictions Weight Bearing Restrictions: Yes LLE Weight Bearing: Weight bearing as tolerated   Pertinent Vitals/Pain 3/10; pt requesting pain medicine    Mobility  Bed Mobility Bed Mobility: Sit to Supine Sit to Supine: 4: Min assist Details for Bed Mobility Assistance: (A) to advance LEs into bed; cues for sequencing  Transfers Transfers: Sit to Stand;Stand to Sit Sit to Stand: 4: Min guard;From chair/3-in-1;With armrests Stand to Sit: 4: Min guard;To bed Details for Transfer Assistance: cues to adhere to hip precautions; min guard for safety and to steady  Ambulation/Gait Ambulation/Gait Assistance: 4: Min guard Ambulation Distance (Feet): 60 Feet Assistive device: Rolling walker Ambulation/Gait Assistance Details: cues for gt sequencing and to adhere to hip precautions with turns Gait Pattern: Step-to pattern;Decreased  stance time - left;Decreased step length - right;Trunk flexed;Narrow base of support Gait velocity: decreased due to pain and dizziness Stairs: No Wheelchair Mobility Wheelchair Mobility: No    Exercises Total Joint Exercises Ankle Circles/Pumps: AROM;Both;10 reps;Seated   PT Diagnosis:    PT Problem List:   PT Treatment Interventions:     PT Goals (current goals can now be found in the care plan section) Acute Rehab PT Goals Patient Stated Goal: to go home soon after rehab PT Goal Formulation: With patient Potential to Achieve Goals: Good  Visit Information  Last PT Received On: 05/16/13 Assistance Needed: +1 History of Present Illness: Fall resulting in Lt femur neck fx; S/p Lt THA     Subjective Data  Subjective: pt sitting in chair; agreeable to therapy; requesting to go to the bathroom Patient Stated Goal: to go home soon after rehab   Cognition  Cognition Arousal/Alertness: Awake/alert Behavior During Therapy: WFL for tasks assessed/performed Overall Cognitive Status: Within Functional Limits for tasks assessed    Balance  Balance Balance Assessed: No  End of Session PT - End of Session Equipment Utilized During Treatment: Gait belt Activity Tolerance: Patient tolerated treatment well Patient left: in bed;with call bell/phone within reach Nurse Communication: Mobility status   GP     Donell Sievert, [place] [phone] 05/16/2013, 9:55 AM

## 2013-05-16 NOTE — Progress Notes (Signed)
Patient discharged to [facility]. She was taken via ambulance for transport

## 2013-05-17 ENCOUNTER — Encounter (HOSPITAL_COMMUNITY): Payer: Self-pay | Admitting: Orthopedic Surgery

## 2013-05-17 ENCOUNTER — Non-Acute Institutional Stay (SKILLED_NURSING_FACILITY): Payer: Medicare Other | Admitting: Adult Health

## 2013-05-17 ENCOUNTER — Encounter: Payer: Self-pay | Admitting: Adult Health

## 2013-05-17 DIAGNOSIS — M25552 Pain in left hip: Secondary | ICD-10-CM | POA: Insufficient documentation

## 2013-05-17 DIAGNOSIS — K5900 Constipation, unspecified: Secondary | ICD-10-CM

## 2013-05-17 DIAGNOSIS — K589 Irritable bowel syndrome without diarrhea: Secondary | ICD-10-CM | POA: Insufficient documentation

## 2013-05-17 DIAGNOSIS — M25559 Pain in unspecified hip: Secondary | ICD-10-CM

## 2013-05-17 DIAGNOSIS — K219 Gastro-esophageal reflux disease without esophagitis: Secondary | ICD-10-CM

## 2013-05-17 DIAGNOSIS — D62 Acute posthemorrhagic anemia: Secondary | ICD-10-CM

## 2013-05-17 DIAGNOSIS — S72009D Fracture of unspecified part of neck of unspecified femur, subsequent encounter for closed fracture with routine healing: Secondary | ICD-10-CM

## 2013-05-17 DIAGNOSIS — S72142D Displaced intertrochanteric fracture of left femur, subsequent encounter for closed fracture with routine healing: Secondary | ICD-10-CM

## 2013-05-17 NOTE — Progress Notes (Signed)
Patient ID: Heather Fitzgerald, female   DOB: 1934-05-30, 79 y.o.   MRN: [id number]       PROGRESS NOTE  DATE: 05/17/2013  FACILITY: Nursing Home Location: [facility]  LEVEL OF CARE: SNF (31)  Acute Visit  CHIEF COMPLAINT:  Follow-up hospitalization  HISTORY OF PRESENT ILLNESS: This is a 79 year old female who has been admitted to [facility] on 05/16/13 from [facility]. She had a fall, at a friend's deck, and sustained a left hip fracture S/P Arthroplasty. She has been admitted for a short-term rehabilitation.  REASSESSMENT OF ONGOING PROBLEM(S):  HTN: Pt 's HTN remains stable.  Denies CP, sob, DOE, pedal edema, headaches, dizziness or visual disturbances.  No complications from the medications currently being used.  Last BP : 134/52  ANEMIA: The anemia has been stable. The patient denies fatigue, melena or hematochezia. No complications from the medications currently being used. 10/14 hgb 9.7  CONSTIPATION: The constipation remains a problem. No complications from the medications presently being used. Patient complains of ongoing constipation.  PAST MEDICAL HISTORY : Reviewed.  No changes.  CURRENT MEDICATIONS: Reviewed per [facility]  REVIEW OF SYSTEMS:  GENERAL: no change in appetite, no fatigue, no weight changes, no fever, chills or weakness RESPIRATORY: no cough, SOB, DOE, wheezing, hemoptysis CARDIAC: no chest pain, edema or palpitations GI: no abdominal pain, diarrhea, heart burn, nausea or vomiting, + constipation  PHYSICAL EXAMINATION  VS:  T 99       P 60      RR 20      BP 134/52     GENERAL: no acute distress, normal body habitus EYES: conjunctivae normal, sclerae normal, normal eye lids NECK: supple, trachea midline, no neck masses, no thyroid tenderness, no thyromegaly LYMPHATICS: no LAN in the neck, no supraclavicular LAN RESPIRATORY: breathing is even & unlabored, BS CTAB CARDIAC: RRR, no murmur,no extra heart sounds, no edema GI: abdomen  soft, normal BS, no masses, no tenderness, no hepatomegaly, no splenomegaly PSYCHIATRIC: the patient is alert & oriented to person, affect & behavior appropriate  LABS/RADIOLOGY: 05/16/13  Wbc 5.0  hgb 9.7 hct 28.6 05/14/13  NA 140  K 4.5  Glucose 139  BUN 11 creatinine 0.55  Calcium 8.0   ASSESSMENT/PLAN:  Left hip fracture status post arthroplasty - for rehabilitation  Acute diagnosis anemia - continue ferrous sulfate  Hypertension - well controlled  Constipation - start MiraLax 17 g + 6-8 oz of liquid by mouth daily  Pain in left hip - discontinue tramadol 50 mg by mouth every 8 hours and continue with tramadol when necessary   CPT CODE: 40981

## 2013-05-18 ENCOUNTER — Non-Acute Institutional Stay (SKILLED_NURSING_FACILITY): Payer: Medicare Other | Admitting: Internal Medicine

## 2013-05-18 DIAGNOSIS — I10 Essential (primary) hypertension: Secondary | ICD-10-CM

## 2013-05-18 DIAGNOSIS — K219 Gastro-esophageal reflux disease without esophagitis: Secondary | ICD-10-CM

## 2013-05-18 DIAGNOSIS — D62 Acute posthemorrhagic anemia: Secondary | ICD-10-CM

## 2013-05-18 DIAGNOSIS — S72142S Displaced intertrochanteric fracture of left femur, sequela: Secondary | ICD-10-CM

## 2013-05-18 DIAGNOSIS — S72009S Fracture of unspecified part of neck of unspecified femur, sequela: Secondary | ICD-10-CM

## 2013-05-27 ENCOUNTER — Non-Acute Institutional Stay (SKILLED_NURSING_FACILITY): Payer: Medicare Other | Admitting: Adult Health

## 2013-05-27 DIAGNOSIS — K5900 Constipation, unspecified: Secondary | ICD-10-CM

## 2013-05-27 DIAGNOSIS — K219 Gastro-esophageal reflux disease without esophagitis: Secondary | ICD-10-CM

## 2013-05-27 DIAGNOSIS — S72009D Fracture of unspecified part of neck of unspecified femur, subsequent encounter for closed fracture with routine healing: Secondary | ICD-10-CM

## 2013-05-27 DIAGNOSIS — I10 Essential (primary) hypertension: Secondary | ICD-10-CM

## 2013-05-27 DIAGNOSIS — S72142D Displaced intertrochanteric fracture of left femur, subsequent encounter for closed fracture with routine healing: Secondary | ICD-10-CM

## 2013-05-27 DIAGNOSIS — D62 Acute posthemorrhagic anemia: Secondary | ICD-10-CM

## 2013-06-14 NOTE — Progress Notes (Signed)
Patient ID: Heather Fitzgerald, female   DOB: 30-Aug-1933, 79 y.o.   MRN: [id number]        HISTORY & PHYSICAL  DATE: 05/18/2013   FACILITY: [facility]  LEVEL OF CARE: SNF (31)  ALLERGIES:  Allergies  Allergen Reactions  . Iohexol      Code: HIVES, Desc: (+) SKIN TEST 81YRS AGO, OK W/ 1VP 15 YRS AGO, TODAY W/ HIVES, 50MG  BENADRYL PO.Marland KitchenPT OK, SUGGESTED 13 PRE MEDS FOR FUTURE IV CONTRAST PER DR.MATTERN//A.C.PT CALLS [place] 2 DAYS POST INJ, SHE HAS BEEN NAUSEATED SINCE EXAM & HAS STARTED TO ITCH AGAIN,,, WE RE, Onset Date: [id number]   . Sulfa Antibiotics Nausea Only    CHIEF COMPLAINT:  Manage left hip intertrochanteric fracture, acute blood loss anemia, and hypertension.    HISTORY OF PRESENT ILLNESS:  The patient is a 79 year-old, Caucasian female.    HIP FRACTURE: The patient had a mechanical fall and sustained a femur fracture.  Patient subsequently underwent surgical repair and tolerated the procedure well. Patient is admitted to this facility for short-term rehabilitation. Patient denies hip pain currently. No complications reported from the pain medications currently being used.    ANEMIA:  Postoperatively, patient suffered acute blood loss.   The anemia has been stable. The patient denies fatigue, melena or hematochezia. No complications from the medications currently being used.  Last hemoglobins are:   10, 9.7.    HTN: Pt 's HTN remains stable.  Denies CP, sob, DOE, pedal edema, headaches, dizziness or visual disturbances.  No complications from the medications currently being used.  Last BP :  129/54.    PAST MEDICAL HISTORY :  Past Medical History  Diagnosis Date  . Hypertension   . GERD (gastroesophageal reflux disease)     PAST SURGICAL HISTORY: Past Surgical History  Procedure Laterality Date  . Hip arthroplasty Left 05/13/2013    Procedure: ARTHROPLASTY BIPOLAR HIP;  Surgeon: Verlee Rossetti, MD;  Location: [facility];  Service: Orthopedics;  Laterality: Left;     SOCIAL HISTORY:  reports that she has never smoked. She does not have any smokeless tobacco history on file. She reports that she does not drink alcohol or use illicit drugs. 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    FAMILY HISTORY:  MOTHER/FATHER:  Parents are deceased.   SIBLINGS:  Sister has glaucoma and diabetes mellitus.  Brother has hypertension.    CURRENT MEDICATIONS: Reviewed per [facility]  REVIEW OF SYSTEMS:  See HPI otherwise 14 point ROS is negative.  PHYSICAL EXAMINATION  VS:  T 98.2       P 77      RR 19      BP 129/54      POX 98% room air        WT (Lb)  GENERAL: no acute distress, normal body habitus EYES: conjunctivae normal, sclerae normal, normal eye lids MOUTH/THROAT: lips without lesions,no lesions in the mouth,tongue is without lesions,uvula elevates in midline NECK: supple, trachea midline, no neck masses, no thyroid tenderness, no thyromegaly LYMPHATICS: no LAN in the neck, no supraclavicular LAN RESPIRATORY: breathing is even & unlabored, BS CTAB CARDIAC: RRR, no murmur,no extra heart sounds, no edema GI:  ABDOMEN: abdomen soft, normal BS, no masses, no tenderness  LIVER/SPLEEN: no hepatomegaly, no splenomegaly MUSCULOSKELETAL: HEAD: normal to inspection & palpation BACK: no kyphosis, scoliosis or spinal processes tenderness EXTREMITIES: LEFT UPPER EXTREMITY: full range of motion, normal strength &  tone RIGHT UPPER EXTREMITY:  full range of motion, normal strength & tone LEFT LOWER EXTREMITY:  strength intact, range of motion not tested due to surgery   RIGHT LOWER EXTREMITY:  full range of motion, normal strength & tone PSYCHIATRIC: the patient is alert & oriented to person, affect & behavior appropriate  LABS/RADIOLOGY: Pelvic x-ray:  No adverse features related to left hip hemiarthroplasty.    MRSA by PCR negative.     Glucose 139, otherwise BMP normal.    Hemoglobin 9.7, MCV 88.3, platelets 131, WBC 5.     Troponin-I less than 0.03.    EKG:  Showed sinus rhythm with no ST changes.     ASSESSMENT/PLAN:  Left hip intertrochanteric fracture.  Status post left hip hemiarthroplasty.  Continue rehabilitation.    Acute blood loss anemia.  Reassess hemoglobin level.    Hypertension.  Well controlled.     GERD.  Well controlled.      Constipation.  Well controlled.     Check CBC and BMP.    I have reviewed patient's medical records received at admission/from hospitalization.  CPT CODE: 16109

## 2013-06-21 NOTE — Progress Notes (Signed)
Patient ID: Heather Fitzgerald, female   DOB: 05/03/1934, 79 y.o.   MRN: [id number]       PROGRESS NOTE  DATE: 05/27/2013   FACILITY: [facility]  LEVEL OF CARE: SNF (31)  Acute Visit  CHIEF COMPLAINT:  Discharge Notes  HISTORY OF PRESENT ILLNESS: This is a 79 year old female who is for discharge home with Home health PT, OT and Nursing. DME:  Rolling walker and bedside commode due to unsteady gait. She has been admitted to [facility] on 05/16/13 from [facility]. She had a fall, at a friend's deck, and sustained a left hip fracture S/P Arthroplasty. Patient was admitted to this facility for short-term rehabilitation after the patient's recent hospitalization.  Patient has completed SNF rehabilitation and therapy has cleared the patient for discharge.  Reassessment of ongoing problem(s):  HTN: Pt 's HTN remains stable.  Denies CP, sob, DOE, pedal edema, headaches, dizziness or visual disturbances.  No complications from the medications currently being used.  Last BP : 133/77  GERD: pt's GERD is stable.  Denies ongoing heartburn, abd. Pain, nausea or vomiting.  Currently on a PPI & tolerates it without any adverse reactions.  ANEMIA: The anemia has been stable. The patient denies fatigue, melena or hematochezia. No complications from the medications currently being used. 10/14  hgb 10.7   PAST MEDICAL HISTORY : Reviewed.  No changes.  CURRENT MEDICATIONS: Reviewed per [facility]  REVIEW OF SYSTEMS:  GENERAL: no change in appetite, no fatigue, no weight changes, no fever, chills or weakness RESPIRATORY: no cough, SOB, DOE, wheezing, hemoptysis CARDIAC: no chest pain, edema or palpitations GI: no abdominal pain, diarrhea, constipation, heart burn, nausea or vomiting  PHYSICAL EXAMINATION  VS:  T98.4       P72       RR18      BP133/77             WT124.4 (Lb)  GENERAL: no acute distress, normal body habitus NECK: supple, trachea midline, no neck masses, no thyroid  tenderness, no thyromegaly LYMPHATICS: no LAN in the neck, no supraclavicular LAN RESPIRATORY: breathing is even & unlabored, BS CTAB CARDIAC: RRR, no murmur,no extra heart sounds, no edema GI: abdomen soft, normal BS, no masses, no tenderness, no hepatomegaly, no splenomegaly PSYCHIATRIC: the patient is alert & oriented to person, affect & behavior appropriate  LABS/RADIOLOGY: 05/27/13 WBC 5.5 hemoglobin 10.7 hematocrit 33.5  05/16/13  Wbc 5.0  hgb 9.7 hct 28.6 05/14/13  NA 140  K 4.5  Glucose 139  BUN 11 creatinine 0.55  Calcium 8.0   ASSESSMENT/PLAN:  Left hip fracture status post arthroplasty - for Home health PT, OT and Nursing  Acute diagnosis anemia - stable; continue ferrous sulfate  Hypertension - well controlled  Constipation - no complaints  GERD - continue Prilosec   I have filled out patient's discharge paperwork and written prescriptions.  Patient will receive home health PT, OT  And Nursing.   DME provided: Rolling walker and Bedside commode  Total discharge time: Greater than 30 minutes  Discharge time involved coordination of the discharge process with Child psychotherapist, nursing staff and therapy department. Medical justification for home health services/DME verified.  CPT CODE: 24401

## 2013-06-21 NOTE — Progress Notes (Signed)
This encounter was created in error - please disregard.

## 2014-01-20 ENCOUNTER — Other Ambulatory Visit: Payer: Self-pay | Admitting: Orthopedic Surgery

## 2014-01-20 DIAGNOSIS — R1032 Left lower quadrant pain: Secondary | ICD-10-CM

## 2014-01-25 ENCOUNTER — Ambulatory Visit
Admission: RE | Admit: 2014-01-25 | Discharge: 2014-01-25 | Disposition: A | Discharge status: Home / Self Care | Payer: Medicare Other | Source: Ambulatory Visit | Attending: Orthopedic Surgery | Admitting: Orthopedic Surgery

## 2014-01-25 DIAGNOSIS — R1032 Left lower quadrant pain: Secondary | ICD-10-CM

## 2014-03-13 ENCOUNTER — Encounter (HOSPITAL_COMMUNITY): Payer: Self-pay | Admitting: Pharmacy Technician

## 2014-03-15 NOTE — H&P (Signed)
TOTAL HIP CONVERSION ADMISSION H&P  Patient is admitted for left  total hip arthroplasty, conversion from hemiarthroplasty.  Subjective:  Chief Complaint: Left hip pain s/p left hip hemiarthroplasty  HPI: Heather Fitzgerald, 80 y.o. female, has a history of pain and functional disability in the left hip due to trauma and patient has failed non-surgical conservative treatments for greater than 12 weeks to include NSAID's and/or analgesics, use of assistive devices and activity modification. The indications for the revision total hip arthroplasty are bearing surface wear leading to  hip pain.  Onset of symptoms was abrupt starting since having a hip hemiarthroplasty by Dr. Veverly Fells after a fal on May 12, 2013  with gradually worsening course since that time.  Prior procedures on the left hip include hemi-arthroplasty.  Patient currently rates pain in the left hip at 8 out of 10 with activity.  There is night pain, worsening of pain with activity and weight bearing, trendelenberg gait, pain that interfers with activities of daily living and pain with passive range of motion. Patient has evidence of previous hemi arthroplasty, appearing to be in good position. by imaging studies.  This condition presents safety issues increasing the risk of falls.  There is no current active infection.  Risks, benefits and expectations were discussed with the patient.  Risks including but not limited to the risk of anesthesia, blood clots, nerve damage, blood vessel damage, failure of the prosthesis, infection and up to and including death.  Patient understand the risks, benefits and expectations and wishes to proceed with surgery.   PCP: No primary provider on file.  D/C Plans:      SNF - [facility]  Post-op Meds:       No Rx given  Tranexamic Acid:      To be given - IV    Decadron:      Is to be given  FYI:     ASA post-op  Norco post-op  Patient Active Problem List   Diagnosis Date Noted  . Constipation 05/17/2013  .  Acute blood loss anemia 05/17/2013  . Pain in left hip 05/17/2013  . Essential hypertension, benign 05/13/2013  . Thrombocytopenia, unspecified 05/13/2013  . GERD (gastroesophageal reflux disease) 05/13/2013  . Intertrochanteric fracture of left hip 05/12/2013   Past Medical History  Diagnosis Date  . Hypertension   . GERD (gastroesophageal reflux disease)     Past Surgical History  Procedure Laterality Date  . Hip arthroplasty Left 05/13/2013    Procedure: ARTHROPLASTY BIPOLAR HIP;  Surgeon: Augustin Schooling, MD;  Location: [facility];  Service: Orthopedics;  Laterality: Left;    No prescriptions prior to admission   Allergies  Allergen Reactions  . Iohexol      Code: HIVES, Desc: (+) SKIN TEST 44YRS AGO, OK W/ 1VP 15 YRS AGO, TODAY W/ HIVES, 50MG  BENADRYL PO.Marland KitchenPT OK, SUGGESTED 13 PRE MEDS FOR FUTURE IV CONTRAST PER DR.MATTERN//A.C.PT CALLS [place] 2 DAYS POST INJ, SHE HAS BEEN NAUSEATED SINCE EXAM & HAS STARTED TO ITCH AGAIN,,, WE RE, Onset Date: [id number]   . Sulfa Antibiotics Nausea Only    History  Substance Use Topics  . Smoking status: Never Smoker   . Smokeless tobacco: Not on file  . Alcohol Use: No    No family history on file.    Review of Systems  Constitutional: Negative.   HENT: Negative.   Eyes: Negative.   Respiratory: Negative.   Cardiovascular: Negative.   Gastrointestinal: Positive for heartburn and constipation.  Musculoskeletal:  Positive for joint pain.  Skin: Negative.   Neurological: Negative.   Endo/Heme/Allergies: Negative.   Psychiatric/Behavioral: Negative.     Objective:  Physical Exam  Constitutional: She is oriented to person, place, and time. She appears well-developed and well-nourished.  HENT:  Head: Normocephalic and atraumatic.  Mouth/Throat: Oropharynx is clear and moist.  Eyes: Pupils are equal, round, and reactive to light.  Neck: Neck supple. No JVD present. No tracheal deviation present. No thyromegaly present.  Cardiovascular:  Normal rate, regular rhythm, normal heart sounds and intact distal pulses.   Respiratory: Effort normal and breath sounds normal. No stridor. No respiratory distress. She has no wheezes.  GI: Soft. There is no tenderness. There is no guarding.  Musculoskeletal:       Left hip: She exhibits decreased range of motion, decreased strength, tenderness, bony tenderness and laceration (healed). She exhibits no swelling and no deformity.  Lymphadenopathy:    She has no cervical adenopathy.  Neurological: She is alert and oriented to person, place, and time.  Skin: Skin is warm and dry.  Psychiatric: She has a normal mood and affect.      Labs:  Estimated body mass index is 23.99 kg/(m^2) as calculated from the following:   Height as of 05/12/13: 5\' 2"  (1.575 m).   Weight as of 05/12/13: 59.5 kg (131 lb 2.8 oz).  Imaging Review:  Plain radiographs demonstrate previous hemiarthroplasty of the left hip(s). The bone quality appears to be good for age and reported activity level.   Assessment/Plan:  End stage arthritis, left hip(s) with failed previous hemi arthroplasty.  The patient history, physical examination, clinical judgement of the provider and imaging studies are consistent with left hip(s), previous hip hemi arthroplasty. Conversion from hemi to total hip arthroplasty is deemed medically necessary. The treatment options including medical management, injection therapy, arthroscopy and arthroplasty were discussed at length. The risks and benefits of total hip arthroplasty were presented and reviewed. The risks due to aseptic loosening, infection, stiffness, dislocation/subluxation,  thromboembolic complications and other imponderables were discussed.  The patient acknowledged the explanation, agreed to proceed with the plan and consent was signed. Patient is being admitted for inpatient treatment for surgery, pain control, PT, OT, prophylactic antibiotics, VTE prophylaxis, progressive ambulation  and ADL's and discharge planning. The patient is planning to be discharged to skilled nursing facility.     [place] Pugh Dashawna Delbridge   PA-C  03/15/2014, 12:53 PM

## 2014-03-17 NOTE — Patient Instructions (Signed)
Heather Fitzgerald  03/17/2014   Your procedure is scheduled on:  03/27/14    Report to [facility].  Follow the Signs to [place] at  [place]      am  Call this number if you have problems the morning of surgery: [phone]   Remember:   Do not eat food or drink liquids after midnight.   Take these medicines the morning of surgery with A SIP OF WATER:    Do not wear jewelry, make-up or nail polish.  Do not wear lotions, powders, or perfumes. , deodorant .  Do not shave 48 hours prior to surgery.   Do not bring valuables to the hospital.  Contacts, dentures or bridgework may not be worn into surgery.  Leave suitcase in the car. After surgery it may be brought to your room.  For patients admitted to the hospital, checkout time is 11:00 AM the day of  discharge.     [facility] - Preparing for Surgery Before surgery, you can play an important role.  Because skin is not sterile, your skin needs to be as free of germs as possible.  You can reduce the number of germs on your skin by washing with CHG (chlorahexidine gluconate) soap before surgery.  CHG is an antiseptic cleaner which kills germs and bonds with the skin to continue killing germs even after washing. Please DO NOT use if you have an allergy to CHG or antibacterial soaps.  If your skin becomes reddened/irritated stop using the CHG and inform your nurse when you arrive at Short Stay. Do not shave (including legs and underarms) for at least 48 hours prior to the first CHG shower.  You may shave your face/neck. Please follow these instructions carefully:  1.  Shower with CHG Soap the night before surgery and the  morning of Surgery.  2.  If you choose to wash your hair, wash your hair first as usual with your  normal  shampoo.  3.  After you shampoo, rinse your hair and body thoroughly to remove the  shampoo.                           4.  Use CHG as you would any other liquid soap.  You can apply chg directly  to the skin and  wash                       Gently with a scrungie or clean washcloth.  5.  Apply the CHG Soap to your body ONLY FROM THE NECK DOWN.   Do not use on face/ open                           Wound or open sores. Avoid contact with eyes, ears mouth and genitals (private parts).                       Wash face,  Genitals (private parts) with your normal soap.             6.  Wash thoroughly, paying special attention to the area where your surgery  will be performed.  7.  Thoroughly rinse your body with warm water from the neck down.  8.  DO NOT shower/wash with your normal soap after using and rinsing off  the CHG Soap.  9.  Pat yourself dry with a clean towel.            10.  Wear clean pajamas.            11.  Place clean sheets on your bed the night of your first shower and do not  sleep with pets. Day of Surgery : Do not apply any lotions/deodorants the morning of surgery.  Please wear clean clothes to the hospital/surgery center.  FAILURE TO FOLLOW THESE INSTRUCTIONS MAY RESULT IN THE CANCELLATION OF YOUR SURGERY PATIENT SIGNATURE_________________________________  NURSE SIGNATURE__________________________________  ________________________________________________________________________  WHAT IS A BLOOD TRANSFUSION? Blood Transfusion Information  A transfusion is the replacement of blood or some of its parts. Blood is made up of multiple cells which provide different functions.  Red blood cells carry oxygen and are used for blood loss replacement.  White blood cells fight against infection.  Platelets control bleeding.  Plasma helps clot blood.  Other blood products are available for specialized needs, such as hemophilia or other clotting disorders. BEFORE THE TRANSFUSION  Who gives blood for transfusions?   Healthy volunteers who are fully evaluated to make sure their blood is safe. This is blood bank blood. Transfusion therapy is the safest it has ever been in the  practice of medicine. Before blood is taken from a donor, a complete history is taken to make sure that person has no history of diseases nor engages in risky social behavior (examples are intravenous drug use or sexual activity with multiple partners). The donor's travel history is screened to minimize risk of transmitting infections, such as malaria. The donated blood is tested for signs of infectious diseases, such as HIV and hepatitis. The blood is then tested to be sure it is compatible with you in order to minimize the chance of a transfusion reaction. If you or a relative donates blood, this is often done in anticipation of surgery and is not appropriate for emergency situations. It takes many days to process the donated blood. RISKS AND COMPLICATIONS Although transfusion therapy is very safe and saves many lives, the main dangers of transfusion include:   Getting an infectious disease.  Developing a transfusion reaction. This is an allergic reaction to something in the blood you were given. Every precaution is taken to prevent this. The decision to have a blood transfusion has been considered carefully by your caregiver before blood is given. Blood is not given unless the benefits outweigh the risks. AFTER THE TRANSFUSION  Right after receiving a blood transfusion, you will usually feel much better and more energetic. This is especially true if your red blood cells have gotten low (anemic). The transfusion raises the level of the red blood cells which carry oxygen, and this usually causes an energy increase.  The nurse administering the transfusion will monitor you carefully for complications. HOME CARE INSTRUCTIONS  No special instructions are needed after a transfusion. You may find your energy is better. Speak with your caregiver about any limitations on activity for underlying diseases you may have. SEEK MEDICAL CARE IF:   Your condition is not improving after your transfusion.  You  develop redness or irritation at the intravenous (IV) site. SEEK IMMEDIATE MEDICAL CARE IF:  Any of the following symptoms occur over the next 12 hours:  Shaking chills.  You have a temperature by mouth above 102 F (38.9 C), not controlled by medicine.  Chest, back, or muscle pain.  People around you feel you are not acting correctly or  are confused.  Shortness of breath or difficulty breathing.  Dizziness and fainting.  You get a rash or develop hives.  You have a decrease in urine output.  Your urine turns a dark color or changes to pink, red, or brown. Any of the following symptoms occur over the next 10 days:  You have a temperature by mouth above 102 F (38.9 C), not controlled by medicine.  Shortness of breath.  Weakness after normal activity.  The white part of the eye turns yellow (jaundice).  You have a decrease in the amount of urine or are urinating less often.  Your urine turns a dark color or changes to pink, red, or brown. Document Released: 07/25/2000 Document Revised: 10/20/2011 Document Reviewed: 03/13/2008 ExitCare Patient Information 2014 [place].  _______________________________________________________________________  Incentive Spirometer  An incentive spirometer is a tool that can help keep your lungs clear and active. This tool measures how well you are filling your lungs with each breath. Taking long deep breaths may help reverse or decrease the chance of developing breathing (pulmonary) problems (especially infection) following:  A long period of time when you are unable to move or be active. BEFORE THE PROCEDURE   If the spirometer includes an indicator to show your best effort, your nurse or respiratory therapist will set it to a desired goal.  If possible, sit up straight or lean slightly forward. Try not to slouch.  Hold the incentive spirometer in an upright position. INSTRUCTIONS FOR USE  1. Sit on the edge of your bed if  possible, or sit up as far as you can in bed or on a chair. 2. Hold the incentive spirometer in an upright position. 3. Breathe out normally. 4. Place the mouthpiece in your mouth and seal your lips tightly around it. 5. Breathe in slowly and as deeply as possible, raising the piston or the ball toward the top of the column. 6. Hold your breath for 3-5 seconds or for as long as possible. Allow the piston or ball to fall to the bottom of the column. 7. Remove the mouthpiece from your mouth and breathe out normally. 8. Rest for a few seconds and repeat Steps 1 through 7 at least 10 times every 1-2 hours when you are awake. Take your time and take a few normal breaths between deep breaths. 9. The spirometer may include an indicator to show your best effort. Use the indicator as a goal to work toward during each repetition. 10. After each set of 10 deep breaths, practice coughing to be sure your lungs are clear. If you have an incision (the cut made at the time of surgery), support your incision when coughing by placing a pillow or rolled up towels firmly against it. Once you are able to get out of bed, walk around indoors and cough well. You may stop using the incentive spirometer when instructed by your caregiver.  RISKS AND COMPLICATIONS  Take your time so you do not get dizzy or light-headed.  If you are in pain, you may need to take or ask for pain medication before doing incentive spirometry. It is harder to take a deep breath if you are having pain. AFTER USE  Rest and breathe slowly and easily.  It can be helpful to keep track of a log of your progress. Your caregiver can provide you with a simple table to help with this. If you are using the spirometer at home, follow these instructions: [place] IF:  You are having difficultly using the spirometer.  You have trouble using the spirometer as often as instructed.  Your pain medication is not giving enough relief while using  the spirometer.  You develop fever of 100.5 F (38.1 C) or higher. SEEK IMMEDIATE MEDICAL CARE IF:   You cough up bloody sputum that had not been present before.  You develop fever of 102 F (38.9 C) or greater.  You develop worsening pain at or near the incision site. MAKE SURE YOU:   Understand these instructions.  Will watch your condition.  Will get help right away if you are not doing well or get worse. Document Released: 12/08/2006 Document Revised: 10/20/2011 Document Reviewed: 02/08/2007 ExitCare Patient Information 2014 ExitCare, [place].   ________________________________________________________________________    Please read over the following fact sheets that you were given: MRSA Information, coughing and deep breathing exercises, leg exercises

## 2014-03-20 ENCOUNTER — Encounter (HOSPITAL_COMMUNITY): Payer: Self-pay

## 2014-03-20 ENCOUNTER — Encounter (HOSPITAL_COMMUNITY)
Admission: RE | Admit: 2014-03-20 | Discharge: 2014-03-20 | Disposition: A | Discharge status: Home / Self Care | Payer: Medicare Other | Source: Ambulatory Visit | Attending: Orthopedic Surgery | Admitting: Orthopedic Surgery

## 2014-03-20 DIAGNOSIS — Z01818 Encounter for other preprocedural examination: Secondary | ICD-10-CM | POA: Diagnosis not present

## 2014-03-20 DIAGNOSIS — Z01812 Encounter for preprocedural laboratory examination: Secondary | ICD-10-CM | POA: Insufficient documentation

## 2014-03-20 HISTORY — DX: Unspecified asthma, uncomplicated: J45.909

## 2014-03-20 HISTORY — DX: Headache: R51

## 2014-03-20 HISTORY — DX: Thrombocytopenia, unspecified: D69.6

## 2014-03-20 HISTORY — DX: Anxiety disorder, unspecified: F41.9

## 2014-03-20 LAB — URINALYSIS, ROUTINE W REFLEX MICROSCOPIC
Bilirubin Urine: NEGATIVE
GLUCOSE, UA: NEGATIVE mg/dL
KETONES UR: NEGATIVE mg/dL
LEUKOCYTES UA: NEGATIVE
NITRITE: NEGATIVE
PH: 6 (ref 5.0–8.0)
Protein, ur: NEGATIVE mg/dL
Specific Gravity, Urine: 1.021 (ref 1.005–1.030)
Urobilinogen, UA: 0.2 mg/dL (ref 0.0–1.0)

## 2014-03-20 LAB — CBC
HEMATOCRIT: 41.2 % (ref 36.0–46.0)
Hemoglobin: 13.9 g/dL (ref 12.0–15.0)
MCH: 30.2 pg (ref 26.0–34.0)
MCHC: 33.7 g/dL (ref 30.0–36.0)
MCV: 89.4 fL (ref 78.0–100.0)
Platelets: 154 10*3/uL (ref 150–400)
RBC: 4.61 MIL/uL (ref 3.87–5.11)
RDW: 13.7 % (ref 11.5–15.5)
WBC: 5.5 10*3/uL (ref 4.0–10.5)

## 2014-03-20 LAB — BASIC METABOLIC PANEL
ANION GAP: 13 (ref 5–15)
BUN: 17 mg/dL (ref 6–23)
CO2: 29 mEq/L (ref 19–32)
CREATININE: 0.68 mg/dL (ref 0.50–1.10)
Calcium: 9.6 mg/dL (ref 8.4–10.5)
Chloride: 103 mEq/L (ref 96–112)
GFR calc Af Amer: >90 mL/min (ref >90)
GFR calc non Af Amer: 82 mL/min — ABNORMAL LOW (ref >90)
Glucose, Bld: 89 mg/dL (ref 70–99)
Potassium: 3.4 mEq/L — ABNORMAL LOW (ref 3.7–5.3)
Sodium: 145 mEq/L (ref 137–147)

## 2014-03-20 LAB — SURGICAL PCR SCREEN
MRSA, PCR: NEGATIVE
Staphylococcus aureus: NEGATIVE

## 2014-03-20 LAB — PROTIME-INR
INR: 0.93 (ref 0.00–1.49)
PROTHROMBIN TIME: 12.5 s (ref 11.6–15.2)

## 2014-03-20 LAB — URINE MICROSCOPIC-ADD ON

## 2014-03-20 LAB — ABO/RH: ABO/RH(D): A POS

## 2014-03-20 LAB — APTT: APTT: 24 s (ref 24–37)

## 2014-03-20 NOTE — Progress Notes (Signed)
Patient was called and notified of platelet count. Patient at time of preop appointment stated she has had minor skin rash on arms.  Patient was instructed to notfiy Dr Ashby Dawes.

## 2014-03-20 NOTE — Progress Notes (Signed)
Urinalysis with micro results faxed via EPIC to Dr Alvan Dame

## 2014-03-20 NOTE — Progress Notes (Signed)
Clearance note from Dr Ashby Dawes dated 02/26/14 on chart LOV with Dr Ashby Dawes 02/22/14 on chart with 2V CXR- 02/22/14 and EKG 12/06/13 on chart

## 2014-03-27 ENCOUNTER — Inpatient Hospital Stay (HOSPITAL_COMMUNITY): Payer: Medicare Other | Admitting: Anesthesiology

## 2014-03-27 ENCOUNTER — Inpatient Hospital Stay (HOSPITAL_COMMUNITY)
Admission: RE | Admit: 2014-03-27 | Discharge: 2014-03-29 | DRG: 467 | Disposition: A | Discharge status: Skilled Nursing Facility | Payer: Medicare Other | Source: Ambulatory Visit | Attending: Orthopedic Surgery | Admitting: Orthopedic Surgery

## 2014-03-27 ENCOUNTER — Encounter (HOSPITAL_COMMUNITY)
Admission: RE | Disposition: A | Discharge status: Skilled Nursing Facility | Payer: Self-pay | Source: Ambulatory Visit | Attending: Orthopedic Surgery

## 2014-03-27 ENCOUNTER — Encounter (HOSPITAL_COMMUNITY): Payer: Medicare Other | Admitting: Anesthesiology

## 2014-03-27 ENCOUNTER — Encounter (HOSPITAL_COMMUNITY): Payer: Self-pay | Admitting: *Deleted

## 2014-03-27 ENCOUNTER — Inpatient Hospital Stay (HOSPITAL_COMMUNITY): Payer: Medicare Other

## 2014-03-27 DIAGNOSIS — M1610 Unilateral primary osteoarthritis, unspecified hip: Secondary | ICD-10-CM | POA: Diagnosis present

## 2014-03-27 DIAGNOSIS — I10 Essential (primary) hypertension: Secondary | ICD-10-CM | POA: Diagnosis present

## 2014-03-27 DIAGNOSIS — Z01812 Encounter for preprocedural laboratory examination: Secondary | ICD-10-CM

## 2014-03-27 DIAGNOSIS — F411 Generalized anxiety disorder: Secondary | ICD-10-CM | POA: Diagnosis present

## 2014-03-27 DIAGNOSIS — Z96649 Presence of unspecified artificial hip joint: Secondary | ICD-10-CM

## 2014-03-27 DIAGNOSIS — M25559 Pain in unspecified hip: Secondary | ICD-10-CM | POA: Diagnosis present

## 2014-03-27 DIAGNOSIS — D62 Acute posthemorrhagic anemia: Secondary | ICD-10-CM | POA: Diagnosis not present

## 2014-03-27 DIAGNOSIS — T8489XA Other specified complication of internal orthopedic prosthetic devices, implants and grafts, initial encounter: Secondary | ICD-10-CM | POA: Diagnosis present

## 2014-03-27 DIAGNOSIS — Y831 Surgical operation with implant of artificial internal device as the cause of abnormal reaction of the patient, or of later complication, without mention of misadventure at the time of the procedure: Secondary | ICD-10-CM | POA: Diagnosis present

## 2014-03-27 DIAGNOSIS — K219 Gastro-esophageal reflux disease without esophagitis: Secondary | ICD-10-CM | POA: Diagnosis present

## 2014-03-27 DIAGNOSIS — M899 Disorder of bone, unspecified: Secondary | ICD-10-CM | POA: Diagnosis present

## 2014-03-27 DIAGNOSIS — Z91041 Radiographic dye allergy status: Secondary | ICD-10-CM | POA: Diagnosis not present

## 2014-03-27 DIAGNOSIS — Z881 Allergy status to other antibiotic agents status: Secondary | ICD-10-CM

## 2014-03-27 DIAGNOSIS — J45909 Unspecified asthma, uncomplicated: Secondary | ICD-10-CM | POA: Diagnosis present

## 2014-03-27 DIAGNOSIS — M949 Disorder of cartilage, unspecified: Secondary | ICD-10-CM

## 2014-03-27 HISTORY — PX: CONVERSION TO TOTAL HIP: SHX5784

## 2014-03-27 LAB — TYPE AND SCREEN
ABO/RH(D): A POS
Antibody Screen: NEGATIVE

## 2014-03-27 SURGERY — CONVERSION, PREVIOUS HIP SURGERY, TO TOTAL HIP ARTHROPLASTY
Anesthesia: Spinal | Site: Hip | Laterality: Left

## 2014-03-27 MED ORDER — ONDANSETRON HCL 4 MG PO TABS: 4.0000 mg | ORAL_TABLET | Freq: Four times a day (QID) | ORAL | Status: DC | PRN

## 2014-03-27 MED ORDER — DEXTROSE 5 % IV SOLN
500.0000 mg | Freq: Four times a day (QID) | INTRAVENOUS | Status: DC | PRN
  Administered 2014-03-27: 500 mg via INTRAVENOUS
  Filled 2014-03-27: qty 5

## 2014-03-27 MED ORDER — FENTANYL CITRATE 0.05 MG/ML IJ SOLN
INTRAMUSCULAR | Status: AC
  Filled 2014-03-27: qty 2

## 2014-03-27 MED ORDER — METOCLOPRAMIDE HCL 10 MG PO TABS: 5.0000 mg | ORAL_TABLET | Freq: Three times a day (TID) | ORAL | Status: DC | PRN

## 2014-03-27 MED ORDER — MIDAZOLAM HCL 2 MG/2ML IJ SOLN
INTRAMUSCULAR | Status: AC
  Filled 2014-03-27: qty 2

## 2014-03-27 MED ORDER — POLYETHYLENE GLYCOL 3350 17 G PO PACK
17.0000 g | PACK | Freq: Two times a day (BID) | ORAL | Status: DC
  Administered 2014-03-27 – 2014-03-29 (×4): 17 g via ORAL

## 2014-03-27 MED ORDER — FERROUS SULFATE 325 (65 FE) MG PO TABS
325.0000 mg | ORAL_TABLET | Freq: Three times a day (TID) | ORAL | Status: DC
Start: 2014-03-27 — End: 2014-03-29
  Administered 2014-03-27 – 2014-03-29 (×5): 325 mg via ORAL
  Filled 2014-03-27 (×9): qty 1

## 2014-03-27 MED ORDER — PROPOFOL 10 MG/ML IV BOLUS
INTRAVENOUS | Status: AC
  Filled 2014-03-27: qty 20

## 2014-03-27 MED ORDER — FENTANYL CITRATE 0.05 MG/ML IJ SOLN
INTRAMUSCULAR | Status: DC | PRN
  Administered 2014-03-27 (×2): 50 ug via INTRAVENOUS

## 2014-03-27 MED ORDER — METOCLOPRAMIDE HCL 5 MG/ML IJ SOLN
5.0000 mg | Freq: Three times a day (TID) | INTRAMUSCULAR | Status: DC | PRN
  Administered 2014-03-27 (×2): 10 mg via INTRAVENOUS
  Filled 2014-03-27 (×2): qty 2

## 2014-03-27 MED ORDER — MIDAZOLAM HCL 5 MG/5ML IJ SOLN
INTRAMUSCULAR | Status: DC | PRN
  Administered 2014-03-27: 2 mg via INTRAVENOUS

## 2014-03-27 MED ORDER — PANTOPRAZOLE SODIUM 40 MG PO TBEC
40.0000 mg | DELAYED_RELEASE_TABLET | Freq: Every day | ORAL | Status: DC
  Administered 2014-03-27 – 2014-03-29 (×3): 40 mg via ORAL
  Filled 2014-03-27 (×4): qty 1

## 2014-03-27 MED ORDER — PROPOFOL INFUSION 10 MG/ML OPTIME
INTRAVENOUS | Status: DC | PRN
  Administered 2014-03-27: 60 ug/kg/min via INTRAVENOUS

## 2014-03-27 MED ORDER — MAGNESIUM CITRATE PO SOLN: 1.0000 | Freq: Once | ORAL | Status: AC | PRN

## 2014-03-27 MED ORDER — CHLORHEXIDINE GLUCONATE 4 % EX LIQD: 60.0000 mL | Freq: Once | CUTANEOUS | Status: DC

## 2014-03-27 MED ORDER — PHENYLEPHRINE HCL 10 MG/ML IJ SOLN
INTRAMUSCULAR | Status: DC | PRN
  Administered 2014-03-27 (×3): 80 ug via INTRAVENOUS

## 2014-03-27 MED ORDER — BISACODYL 10 MG RE SUPP: 10.0000 mg | Freq: Every day | RECTAL | Status: DC | PRN

## 2014-03-27 MED ORDER — SODIUM CHLORIDE 0.9 % IV SOLN
10.0000 mg | INTRAVENOUS | Status: DC | PRN
  Administered 2014-03-27: 15 ug/min via INTRAVENOUS

## 2014-03-27 MED ORDER — ONDANSETRON HCL 4 MG/2ML IJ SOLN
INTRAMUSCULAR | Status: AC
  Filled 2014-03-27: qty 2

## 2014-03-27 MED ORDER — ONDANSETRON HCL 4 MG/2ML IJ SOLN
INTRAMUSCULAR | Status: DC | PRN
  Administered 2014-03-27: 4 mg via INTRAVENOUS

## 2014-03-27 MED ORDER — DIPHENHYDRAMINE HCL 25 MG PO CAPS: 25.0000 mg | ORAL_CAPSULE | Freq: Four times a day (QID) | ORAL | Status: DC | PRN

## 2014-03-27 MED ORDER — AMLODIPINE BESYLATE 2.5 MG PO TABS
2.5000 mg | ORAL_TABLET | Freq: Every morning | ORAL | Status: DC
  Filled 2014-03-27: qty 1

## 2014-03-27 MED ORDER — CEFAZOLIN SODIUM-DEXTROSE 2-3 GM-% IV SOLR
INTRAVENOUS | Status: AC
  Filled 2014-03-27: qty 50

## 2014-03-27 MED ORDER — PROMETHAZINE HCL 25 MG/ML IJ SOLN: 6.2500 mg | INTRAMUSCULAR | Status: DC | PRN

## 2014-03-27 MED ORDER — DOCUSATE SODIUM 100 MG PO CAPS
100.0000 mg | ORAL_CAPSULE | Freq: Two times a day (BID) | ORAL | Status: DC
  Administered 2014-03-27 – 2014-03-29 (×4): 100 mg via ORAL

## 2014-03-27 MED ORDER — HYDROCODONE-ACETAMINOPHEN 7.5-325 MG PO TABS
1.0000 | ORAL_TABLET | ORAL | Status: DC | PRN
  Administered 2014-03-28 (×4): 1 via ORAL
  Filled 2014-03-27 (×4): qty 1
  Filled 2014-03-27: qty 2

## 2014-03-27 MED ORDER — DEXAMETHASONE SODIUM PHOSPHATE 10 MG/ML IJ SOLN
INTRAMUSCULAR | Status: DC | PRN
  Administered 2014-03-27: 10 mg via INTRAVENOUS

## 2014-03-27 MED ORDER — MEPERIDINE HCL 50 MG/ML IJ SOLN: 6.2500 mg | INTRAMUSCULAR | Status: DC | PRN

## 2014-03-27 MED ORDER — DEXAMETHASONE SODIUM PHOSPHATE 10 MG/ML IJ SOLN: 10.0000 mg | Freq: Once | INTRAMUSCULAR | Status: DC

## 2014-03-27 MED ORDER — MENTHOL 3 MG MT LOZG
1.0000 | LOZENGE | OROMUCOSAL | Status: DC | PRN
  Filled 2014-03-27: qty 9

## 2014-03-27 MED ORDER — ASPIRIN EC 325 MG PO TBEC
325.0000 mg | DELAYED_RELEASE_TABLET | Freq: Two times a day (BID) | ORAL | Status: DC
  Administered 2014-03-28 – 2014-03-29 (×3): 325 mg via ORAL
  Filled 2014-03-27 (×5): qty 1

## 2014-03-27 MED ORDER — SODIUM CHLORIDE 0.9 % IV SOLN
100.0000 mL/h | INTRAVENOUS | Status: DC
  Administered 2014-03-27 – 2014-03-28 (×2): 100 mL/h via INTRAVENOUS
  Filled 2014-03-27 (×7): qty 1000

## 2014-03-27 MED ORDER — AMLODIPINE BESYLATE 2.5 MG PO TABS
2.5000 mg | ORAL_TABLET | Freq: Every morning | ORAL | Status: DC
  Administered 2014-03-28 – 2014-03-29 (×2): 2.5 mg via ORAL
  Filled 2014-03-27 (×2): qty 1

## 2014-03-27 MED ORDER — HYDROCODONE-ACETAMINOPHEN 7.5-325 MG PO TABS
1.0000 | ORAL_TABLET | ORAL | Status: DC
  Administered 2014-03-27 (×2): 1 via ORAL
  Filled 2014-03-27: qty 2
  Filled 2014-03-27: qty 1

## 2014-03-27 MED ORDER — HYDROMORPHONE HCL PF 1 MG/ML IJ SOLN
0.5000 mg | INTRAMUSCULAR | Status: DC | PRN
  Administered 2014-03-27: 0.5 mg via INTRAVENOUS
  Administered 2014-03-27: 1 mg via INTRAVENOUS
  Administered 2014-03-27: 0.5 mg via INTRAVENOUS
  Filled 2014-03-27: qty 1

## 2014-03-27 MED ORDER — BUPIVACAINE HCL (PF) 0.5 % IJ SOLN
INTRAMUSCULAR | Status: AC
  Filled 2014-03-27: qty 30

## 2014-03-27 MED ORDER — PHENOL 1.4 % MT LIQD
1.0000 | OROMUCOSAL | Status: DC | PRN
  Filled 2014-03-27: qty 177

## 2014-03-27 MED ORDER — ALPRAZOLAM 0.25 MG PO TABS: 0.2500 mg | ORAL_TABLET | Freq: Three times a day (TID) | ORAL | Status: DC | PRN

## 2014-03-27 MED ORDER — DEXAMETHASONE SODIUM PHOSPHATE 10 MG/ML IJ SOLN
INTRAMUSCULAR | Status: AC
  Filled 2014-03-27: qty 1

## 2014-03-27 MED ORDER — HYDROMORPHONE HCL PF 1 MG/ML IJ SOLN
INTRAMUSCULAR | Status: AC
  Filled 2014-03-27: qty 1

## 2014-03-27 MED ORDER — TRAMADOL HCL 50 MG PO TABS
50.0000 mg | ORAL_TABLET | Freq: Four times a day (QID) | ORAL | Status: DC | PRN
  Administered 2014-03-27: 50 mg via ORAL
  Filled 2014-03-27: qty 1

## 2014-03-27 MED ORDER — BUPIVACAINE HCL (PF) 0.5 % IJ SOLN
INTRAMUSCULAR | Status: DC | PRN
  Administered 2014-03-27: 15 mg

## 2014-03-27 MED ORDER — ROCURONIUM BROMIDE 100 MG/10ML IV SOLN
INTRAVENOUS | Status: AC
  Filled 2014-03-27: qty 1

## 2014-03-27 MED ORDER — CEFAZOLIN SODIUM-DEXTROSE 2-3 GM-% IV SOLR
2.0000 g | INTRAVENOUS | Status: AC
  Administered 2014-03-27: 2 g via INTRAVENOUS

## 2014-03-27 MED ORDER — METHOCARBAMOL 500 MG PO TABS
500.0000 mg | ORAL_TABLET | Freq: Four times a day (QID) | ORAL | Status: DC | PRN
  Administered 2014-03-27 – 2014-03-28 (×2): 500 mg via ORAL
  Filled 2014-03-27 (×3): qty 1

## 2014-03-27 MED ORDER — LACTATED RINGERS IV SOLN
INTRAVENOUS | Status: DC | PRN
  Administered 2014-03-27 (×2): via INTRAVENOUS

## 2014-03-27 MED ORDER — DEXAMETHASONE SODIUM PHOSPHATE 10 MG/ML IJ SOLN
10.0000 mg | Freq: Once | INTRAMUSCULAR | Status: AC
Start: 2014-03-28 — End: 2014-03-28
  Administered 2014-03-28: 10 mg via INTRAVENOUS
  Filled 2014-03-27: qty 1

## 2014-03-27 MED ORDER — PHENYLEPHRINE HCL 10 MG/ML IJ SOLN
INTRAMUSCULAR | Status: AC
  Filled 2014-03-27: qty 1

## 2014-03-27 MED ORDER — FENTANYL CITRATE 0.05 MG/ML IJ SOLN
25.0000 ug | INTRAMUSCULAR | Status: DC | PRN
  Administered 2014-03-27: 50 ug via INTRAVENOUS

## 2014-03-27 MED ORDER — ONDANSETRON HCL 4 MG/2ML IJ SOLN
4.0000 mg | Freq: Four times a day (QID) | INTRAMUSCULAR | Status: DC | PRN
  Administered 2014-03-27: 4 mg via INTRAVENOUS
  Filled 2014-03-27: qty 2

## 2014-03-27 MED ORDER — ALUM & MAG HYDROXIDE-SIMETH 200-200-20 MG/5ML PO SUSP
30.0000 mL | ORAL | Status: DC | PRN
  Administered 2014-03-28: 30 mL via ORAL
  Filled 2014-03-27: qty 30

## 2014-03-27 MED ORDER — LIDOCAINE HCL (CARDIAC) 20 MG/ML IV SOLN
INTRAVENOUS | Status: AC
  Filled 2014-03-27: qty 5

## 2014-03-27 MED ORDER — TRANEXAMIC ACID 100 MG/ML IV SOLN
1000.0000 mg | Freq: Once | INTRAVENOUS | Status: AC
  Administered 2014-03-27: 1000 mg via INTRAVENOUS
  Filled 2014-03-27: qty 10

## 2014-03-27 MED ORDER — CEFAZOLIN SODIUM-DEXTROSE 2-3 GM-% IV SOLR
2.0000 g | Freq: Four times a day (QID) | INTRAVENOUS | Status: AC
  Administered 2014-03-27 (×2): 2 g via INTRAVENOUS
  Filled 2014-03-27 (×2): qty 50

## 2014-03-27 SURGICAL SUPPLY — 64 items
ADH SKN CLS APL DERMABOND .7 (GAUZE/BANDAGES/DRESSINGS) ×1
BAG SPEC THK2 15X12 ZIP CLS (MISCELLANEOUS) ×1
BAG ZIPLOCK 12X15 (MISCELLANEOUS) ×3 IMPLANT
BLADE SAW SGTL 18X1.27X75 (BLADE) ×1 IMPLANT
BLADE SAW SGTL 18X1.27X75MM (BLADE)
BRUSH FEMORAL CANAL (MISCELLANEOUS) ×3 IMPLANT
CUP ACET PINNACLE SECTR 50MM (Hips) IMPLANT
DERMABOND ADVANCED (GAUZE/BANDAGES/DRESSINGS) ×2
DERMABOND ADVANCED .7 DNX12 (GAUZE/BANDAGES/DRESSINGS) IMPLANT
DRAPE INCISE IOBAN 85X60 (DRAPES) ×3 IMPLANT
DRAPE ORTHO SPLIT 77X108 STRL (DRAPES) ×6
DRAPE POUCH INSTRU U-SHP 10X18 (DRAPES) ×3 IMPLANT
DRAPE SURG 17X11 SM STRL (DRAPES) ×3 IMPLANT
DRAPE SURG ORHT 6 SPLT 77X108 (DRAPES) ×2 IMPLANT
DRAPE U-SHAPE 47X51 STRL (DRAPES) ×3 IMPLANT
DRSG ADAPTIC 3X8 NADH LF (GAUZE/BANDAGES/DRESSINGS) ×2 IMPLANT
DRSG AQUACEL AG ADV 3.5X10 (GAUZE/BANDAGES/DRESSINGS) ×2 IMPLANT
DRSG AQUACEL AG ADV 3.5X14 (GAUZE/BANDAGES/DRESSINGS) ×2 IMPLANT
DRSG EMULSION OIL 3X16 NADH (GAUZE/BANDAGES/DRESSINGS) ×1 IMPLANT
DRSG MEPILEX BORDER 4X4 (GAUZE/BANDAGES/DRESSINGS) ×1 IMPLANT
DRSG MEPILEX BORDER 4X8 (GAUZE/BANDAGES/DRESSINGS) ×1 IMPLANT
DURAPREP 26ML APPLICATOR (WOUND CARE) ×3 IMPLANT
ELECT BLADE TIP CTD 4 INCH (ELECTRODE) ×3 IMPLANT
ELECT REM PT RETURN 9FT ADLT (ELECTROSURGICAL) ×3
ELECTRODE REM PT RTRN 9FT ADLT (ELECTROSURGICAL) ×1 IMPLANT
ELIMINATOR HOLE APEX DEPUY (Hips) ×2 IMPLANT
FACESHIELD WRAPAROUND (MASK) ×12 IMPLANT
FACESHIELD WRAPAROUND OR TEAM (MASK) ×4 IMPLANT
GLOVE BIOGEL PI IND STRL 7.5 (GLOVE) ×1 IMPLANT
GLOVE BIOGEL PI IND STRL 8 (GLOVE) ×1 IMPLANT
GLOVE BIOGEL PI INDICATOR 7.5 (GLOVE) ×2
GLOVE BIOGEL PI INDICATOR 8 (GLOVE) ×2
GLOVE ORTHO TXT STRL SZ7.5 (GLOVE) ×6 IMPLANT
GLOVE SURG ORTHO 8.0 STRL STRW (GLOVE) ×3 IMPLANT
GOWN SPEC L3 XXLG W/TWL (GOWN DISPOSABLE) ×6 IMPLANT
GOWN STRL REUS W/TWL LRG LVL3 (GOWN DISPOSABLE) ×3 IMPLANT
HANDPIECE INTERPULSE COAX TIP (DISPOSABLE)
HEAD FEM STD 32X+9 STRL (Hips) ×2 IMPLANT
KIT BASIN OR (CUSTOM PROCEDURE TRAY) ×3 IMPLANT
LINER ACET PNNCL PLUS4 NEUTRAL (Hips) IMPLANT
MANIFOLD NEPTUNE II (INSTRUMENTS) ×3 IMPLANT
NS IRRIG 1000ML POUR BTL (IV SOLUTION) ×6 IMPLANT
PACK TOTAL JOINT (CUSTOM PROCEDURE TRAY) ×3 IMPLANT
PADDING CAST COTTON 6X4 STRL (CAST SUPPLIES) ×3 IMPLANT
PINNACLE PLUS 4 NEUTRAL (Hips) ×3 IMPLANT
PINNACLE SECTOR CUP 50MM (Hips) ×3 IMPLANT
POSITIONER SURGICAL ARM (MISCELLANEOUS) ×3 IMPLANT
PRESSURIZER FEMORAL UNIV (MISCELLANEOUS) ×1 IMPLANT
SCREW 6.5MMX25MM (Screw) ×2 IMPLANT
SCREW 6.5MMX30MM (Screw) ×2 IMPLANT
SET HNDPC FAN SPRY TIP SCT (DISPOSABLE) ×1 IMPLANT
SPONGE LAP 18X18 X RAY DECT (DISPOSABLE) ×3 IMPLANT
SPONGE LAP 4X18 X RAY DECT (DISPOSABLE) ×3 IMPLANT
STAPLER VISISTAT 35W (STAPLE) ×3 IMPLANT
SUCTION FRAZIER TIP 10 FR DISP (SUCTIONS) ×3 IMPLANT
SUT MNCRL AB 4-0 PS2 18 (SUTURE) ×2 IMPLANT
SUT VIC AB 1 CT1 36 (SUTURE) ×9 IMPLANT
SUT VIC AB 2-0 CT1 27 (SUTURE) ×9
SUT VIC AB 2-0 CT1 TAPERPNT 27 (SUTURE) ×3 IMPLANT
SUT VLOC 180 0 24IN GS25 (SUTURE) ×6 IMPLANT
TOWEL OR 17X26 10 PK STRL BLUE (TOWEL DISPOSABLE) ×6 IMPLANT
TOWER CARTRIDGE SMART MIX (DISPOSABLE) ×1 IMPLANT
TRAY FOLEY CATH 14FRSI W/METER (CATHETERS) ×3 IMPLANT
WATER STERILE IRR 1500ML POUR (IV SOLUTION) ×3 IMPLANT

## 2014-03-27 NOTE — Anesthesia Procedure Notes (Signed)
Spinal  Patient location during procedure: OR Start time: 03/27/2014 7:14 AM End time: 03/27/2014 7:20 AM Staffing CRNA/Resident: Harle Stanford R Performed by: resident/CRNA  Preanesthetic Checklist Completed: patient identified, site marked, surgical consent, pre-op evaluation, timeout performed, IV checked, risks and benefits discussed and monitors and equipment checked Spinal Block Patient position: sitting Prep: Betadine Patient monitoring: heart rate, cardiac monitor, continuous pulse ox and blood pressure Approach: midline Location: L3-4 Injection technique: single-shot Needle Needle type: Sprotte  Needle gauge: 24 G Needle length: 9 cm Needle insertion depth: 5 cm Assessment Sensory level: T6

## 2014-03-27 NOTE — Brief Op Note (Signed)
03/27/2014  8:29 AM  PATIENT:  Heather Fitzgerald  80 y.o. female  PRE-OPERATIVE DIAGNOSIS:  painful left hip hemiarthroplasty  POST-OPERATIVE DIAGNOSIS:  painful left hip hemiarthroplasty  PROCEDURE:  Procedure(s): CONVERSION OF FAILED LEFT HIP HEMI TO TOTAL HIP ARTHROPLASTY (Left)  SURGEON:  Surgeon(s) and Role:    * Mauri Pole, MD - Primary  PHYSICIAN ASSISTANT: Danae Orleans, PA-C  ANESTHESIA:   spinal  EBL:  Total I/O In: 1000 [I.V.:1000] Out: 400 [Urine:250; Blood:150]  BLOOD ADMINISTERED: None  DRAINS: none   LOCAL MEDICATIONS USED:  NONE  SPECIMEN:  No Specimen  DISPOSITION OF SPECIMEN:  N/A  COUNTS:  YES  TOURNIQUET:  * No tourniquets in log *  DICTATION: .Other Dictation: Dictation Number [id number]  PLAN OF CARE: Admit to inpatient   PATIENT DISPOSITION:  PACU - hemodynamically stable.   Delay start of Pharmacological VTE agent (>24hrs) due to surgical blood loss or risk of bleeding: no

## 2014-03-27 NOTE — Anesthesia Postprocedure Evaluation (Signed)
  Anesthesia Post-op Note  Patient: Heather Fitzgerald  Procedure(s) Performed: Procedure(s) (LRB): CONVERSION OF FAILED LEFT HIP HEMI TO TOTAL HIP ARTHROPLASTY (Left)  Patient Location: PACU  Anesthesia Type: Spinal  Level of Consciousness: awake and alert   Airway and Oxygen Therapy: Patient Spontanous Breathing  Post-op Pain: mild  Post-op Assessment: Post-op Vital signs reviewed, Patient's Cardiovascular Status Stable, Respiratory Function Stable, Patent Airway and No signs of Nausea or vomiting  Last Vitals:  Filed Vitals:   03/27/14 1409  BP: 115/50  Pulse: 60  Temp: 36.8 C  Resp: 13    Post-op Vital Signs: stable   Complications: No apparent anesthesia complications

## 2014-03-27 NOTE — Plan of Care (Signed)
Problem: Consults Goal: Diagnosis- Total Joint Replacement Primary Total Hip     

## 2014-03-27 NOTE — Interval H&P Note (Signed)
History and Physical Interval Note:  03/27/2014 7:02 AM  Heather Fitzgerald  has presented today for surgery, with the diagnosis of painful left hip hemi arthroplasty  The various methods of treatment have been discussed with the patient and family. After consideration of risks, benefits and other options for treatment, the patient has consented to  Procedure(s): CONVERSION OF FAILED LEFT HIP HEMI TO TOTAL HIP ARTHROPLASTY (Left) as a surgical intervention .  The patient's history has been reviewed, patient examined, no change in status, stable for surgery.  I have reviewed the patient's chart and labs.  Questions were answered to the patient's satisfaction.     Mauri Pole

## 2014-03-27 NOTE — Transfer of Care (Signed)
Immediate Anesthesia Transfer of Care Note  Patient: Heather Fitzgerald  Procedure(s) Performed: Procedure(s): CONVERSION OF FAILED LEFT HIP HEMI TO TOTAL HIP ARTHROPLASTY (Left)  Patient Location: PACU  Anesthesia Type:Spinal  Level of Consciousness: sedated  Airway & Oxygen Therapy: Patient Spontanous Breathing and Patient connected to face mask oxygen  Post-op Assessment: Report given to PACU RN and Post -op Vital signs reviewed and stable  Post vital signs: Reviewed and stable  Complications: No apparent anesthesia complications

## 2014-03-27 NOTE — Anesthesia Preprocedure Evaluation (Addendum)
Anesthesia Evaluation  Patient identified by MRN, date of birth, ID band Patient awake    Reviewed: Allergy & Precautions, H&P , NPO status , Patient's Chart, lab work & pertinent test results  Airway Mallampati: II TM Distance: >3 FB Neck ROM: Full    Dental no notable dental hx.    Pulmonary neg pulmonary ROS,  breath sounds clear to auscultation  Pulmonary exam normal       Cardiovascular hypertension, Pt. on medications negative cardio ROS  Rhythm:Regular Rate:Normal     Neuro/Psych negative neurological ROS  negative psych ROS   GI/Hepatic negative GI ROS, Neg liver ROS,   Endo/Other  negative endocrine ROS  Renal/GU negative Renal ROS  negative genitourinary   Musculoskeletal negative musculoskeletal ROS (+)   Abdominal   Peds negative pediatric ROS (+)  Hematology negative hematology ROS (+)   Anesthesia Other Findings   Reproductive/Obstetrics negative OB ROS                          Anesthesia Physical Anesthesia Plan  ASA: II  Anesthesia Plan: Spinal   Post-op Pain Management:    Induction:   Airway Management Planned: Simple Face Mask  Additional Equipment:   Intra-op Plan:   Post-operative Plan:   Informed Consent: I have reviewed the patients History and Physical, chart, labs and discussed the procedure including the risks, benefits and alternatives for the proposed anesthesia with the patient or authorized representative who has indicated his/her understanding and acceptance.   Dental advisory given  Plan Discussed with: CRNA  Anesthesia Plan Comments:         Anesthesia Quick Evaluation

## 2014-03-27 NOTE — Op Note (Signed)
NAMEDEVONIA, FARRO NO.:  [id number]  MEDICAL RECORD NO.:  [id number]  LOCATION:  [facility]                         FACILITY:  [facility]  PHYSICIAN:  Pietro Cassis. Alvan Dame, M.D.  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  PROCEDURE:  Conversion of failed left hip hemiarthroplasty, left total hip arthroplasty utilizing a size 50 mm DePuy cup with 32+ 4 neutral AltrX liner, 32+ 9 metal ARTICUL/EZE ball.  SURGEON:  Pietro Cassis. Alvan Dame, M.D.  ASSISTANT:  Danae Orleans, PA-C.  Note that Mr. Guinevere Scarlet was present for the entirety of the case from preoperative position, perioperative management of operative extremity, general facilitation of the case, primary wound closure.  ANESTHESIA:  Spinal.  SPECIMENS:  None.  COMPLICATIONS:  None.  DRAINS:  None.  BLOOD LOSS:  About 150 mL.  INDICATIONS FOR PROCEDURE:  Ms. Jeanell Sparrow is a pleasant 80 year old female with a history of a left femoral neck fracture required a hip hemiarthroplasty in October 2014.  She had persistent weightbearing pain in the groin.  We had worked this up and tried to treat with conservative treatment, trochanteric injection as well as intra- articular injections to see how she did and she get some temporary relief with intra-articular injection based on the persistence of her pain.  She at this point is ready to proceed with a conversion open to improve her overall quality life.  Risks of infection, DVT, dislocation in the setting were discussed.  I also stressed the fact that the hip replacement surgery was very definitive in that.  If there were persistent problems after a hip replacement in the setting, then her pain was going to be coming from something else based on response  to intra-articular injection.  She is willing to assume these risks. Consent was obtained for benefit of pain relief.  PROCEDURE IN DETAIL:  The patient was brought to operative theater. Once adequate anesthesia, preoperative antibiotics, Ancef 2 g as well as 1 g of IV tranexamic acid, she was positioned into the right lateral decubitus position with left side up.  The left lower extremity was then prepped and draped in sterile fashion.  Time-out was performed identifying the patient, planned procedure, and extremity.  The patient had a straight posterior oblique incision over her posterior trochanter and hip.  I extended it with the sharp incision at the apex distally a bit for exposure purposes.  Soft tissue planes were created.  The posterior soft tissues and pseudocapsule were taken down off the posterior aspect of the trochanter and the hip joint exposed.  With the hip joint exposed, the hip was dislocated.  The femoral head was then removed from the femoral trunnion without evidence of loosening of the femoral stem.  I elevated the capsular and gluteal tissues off the ilium and was able to place the trunnion there replacing retractors, acetabulum was exposed.  Based on the fact, it was a 46 mm ball, I started reaming with  47-mm ball, removing remaining cartilage surfaces down to the medial wall.  I reamed up to just the 49 mm reamer and then placed the 59 cup.  It was positioned about 40 degrees of abduction and 20 degrees of forward flexion based on the anatomic position in relation of anterior wall as well as using the hip guide.  I did place 2 cancellous screws into the ilium based on osteopenic bone quality.  I placed a trial liner, did a trial reduction first with 32+ 5 and then a 32+ 9 ball.  At this point in time, the combined anteversion was 50 degrees or so.  I then also found with a +9 ball with the hip extension and the Shuck was minimal and the leg lengths appeared to  be comparable to the down leg.  There was no evidence of any subluxation that had been seen with a +5.  At this point, the hip was dislocated.  The trial components were removed.  The hole eliminator was placed.  Final 32+ 4 neutral AltrX liner was impacted with good visualized rim fit.  The 32+ 9 ARTICUL/EZE ball was then impacted onto a clean and dry trunnion.  The hip was reduced.  At this point, the posterior pseudocapsule was reapproximated to itself using #1 Vicryl.  The remaining wound was closed with 2-0 Vicryl and a running 4-0 Monocryl.  The hip was then cleaned, dried, dressed sterilely using Dermabond and Aquacel dressing.  She was brought to recovery room in stable condition tolerating the procedure well.  She will have her be partial weightbearing for probably 4 weeks and then allow to weight bear as tolerated.  Posterior hip precautions given this type of surgery.     Pietro Cassis Alvan Dame, M.D.     MDO/MEDQ  D:  03/27/2014  T:  03/27/2014  Job:  [id number]

## 2014-03-28 ENCOUNTER — Encounter (HOSPITAL_COMMUNITY): Payer: Self-pay | Admitting: Orthopedic Surgery

## 2014-03-28 LAB — CBC
HEMATOCRIT: 37.7 % (ref 36.0–46.0)
HEMOGLOBIN: 12.9 g/dL (ref 12.0–15.0)
MCH: 31.2 pg (ref 26.0–34.0)
MCHC: 34.2 g/dL (ref 30.0–36.0)
MCV: 91.1 fL (ref 78.0–100.0)
Platelets: 129 10*3/uL — ABNORMAL LOW (ref 150–400)
RBC: 4.14 MIL/uL (ref 3.87–5.11)
RDW: 13.6 % (ref 11.5–15.5)
WBC: 9.2 10*3/uL (ref 4.0–10.5)

## 2014-03-28 LAB — BASIC METABOLIC PANEL
Anion gap: 10 (ref 5–15)
BUN: 11 mg/dL (ref 6–23)
CO2: 28 mEq/L (ref 19–32)
Calcium: 8.6 mg/dL (ref 8.4–10.5)
Chloride: 104 mEq/L (ref 96–112)
Creatinine, Ser: 0.57 mg/dL (ref 0.50–1.10)
GFR calc Af Amer: >90 mL/min (ref >90)
GFR calc non Af Amer: 87 mL/min — ABNORMAL LOW (ref >90)
Glucose, Bld: 128 mg/dL — ABNORMAL HIGH (ref 70–99)
Potassium: 3.9 mEq/L (ref 3.7–5.3)
Sodium: 142 mEq/L (ref 137–147)

## 2014-03-28 MED ORDER — ACETAMINOPHEN 325 MG PO TABS: 325.0000 mg | ORAL_TABLET | Freq: Four times a day (QID) | ORAL | Status: DC | PRN

## 2014-03-28 MED ORDER — CYCLOBENZAPRINE HCL 10 MG PO TABS: 10.0000 mg | ORAL_TABLET | Freq: Three times a day (TID) | ORAL | Status: DC | PRN

## 2014-03-28 MED ORDER — POLYETHYLENE GLYCOL 3350 17 G PO PACK: 17.0000 g | PACK | Freq: Two times a day (BID) | ORAL | Status: DC

## 2014-03-28 MED ORDER — FERROUS SULFATE 325 (65 FE) MG PO TABS: 325.0000 mg | ORAL_TABLET | Freq: Three times a day (TID) | ORAL | Status: DC

## 2014-03-28 MED ORDER — ASPIRIN 325 MG PO TBEC: 325.0000 mg | DELAYED_RELEASE_TABLET | Freq: Two times a day (BID) | ORAL | Status: AC

## 2014-03-28 MED ORDER — TRAMADOL HCL 50 MG PO TABS: 50.0000 mg | ORAL_TABLET | Freq: Four times a day (QID) | ORAL | Status: DC | PRN

## 2014-03-28 MED ORDER — DSS 100 MG PO CAPS: 100.0000 mg | ORAL_CAPSULE | Freq: Two times a day (BID) | ORAL | Status: DC

## 2014-03-28 NOTE — Progress Notes (Signed)
Physical Therapy Treatment Patient Details Name: Heather Fitzgerald MRN: [id number] DOB: 28-Nov-1933 Today's Date: 03/28/2014    History of Present Illness Pt is s/p conversion of failed left hip hemiarthroplasty to left total arthroplasty. Pt with history of HTN.    PT Comments    POD # 1 pm session.  Assisted pt OOb to BR then to amb limited distance in hallway.  Pt requires increased time and demon unsteady gait.  HIGH FALL Risk.  Pt will need ST Rehab at SNF prior to returning home.  Follow Up Recommendations        Equipment Recommendations       Recommendations for Other Services       Precautions / Restrictions Precautions Precautions: Posterior Hip Restrictions Weight Bearing Restrictions: Yes LLE Weight Bearing: Partial weight bearing Other Position/Activity Restrictions: PWB 50% L LE    Mobility  Bed Mobility Overal bed mobility: Needs Assistance Bed Mobility: Supine to Sit     Supine to sit: Min assist     General bed mobility comments: verbal cues for technique within precautions, assist for L LE  Transfers Overall transfer level: Needs assistance Equipment used: Rolling walker (2 wheeled) Transfers: Sit to/from Stand Sit to Stand: Min assist            Ambulation/Gait    Min assit 24 feet with RW with increased time and very unsteady gait.  HIGH FALL RISK             Stairs            Wheelchair Mobility    Modified Rankin (Stroke Patients Only)       Balance                                    Cognition                            Exercises      General Comments        Pertinent Vitals/Pain      Home Living                      Prior Function            PT Goals (current goals can now be found in the care plan section)      Frequency       PT Plan      Co-evaluation             End of Session           Time: 1510-1530 PT Time Calculation (min): 20  min  Charges:  $Gait Training: 8-22 mins                    G Codes:      Rica Koyanagi  PTA [facility]  Acute  Rehab Pager      [phone]

## 2014-03-28 NOTE — Evaluation (Signed)
Physical Therapy Evaluation Patient Details Name: Heather Fitzgerald MRN: [id number] DOB: 11-09-1933 Today's Date: 03/28/2014   History of Present Illness  Pt is s/p conversion of failed left hip hemiarthroplasty to left total arthroplasty. Pt with history of HTN.  Clinical Impression  Pt is s/p L THA resulting in the deficits listed below (see PT Problem List).  Pt will benefit from skilled PT to increase their independence and safety with mobility to allow discharge to the venue listed below.  Pt mobilizing well POD #1 and plans to d/c to SNF.    Follow Up Recommendations SNF    Equipment Recommendations  None recommended by PT    Recommendations for Other Services       Precautions / Restrictions Precautions Precautions: Posterior Hip Precaution Booklet Issued: Yes (comment) Precaution Comments: pt unable to recall precautions from previous hip hemi so demonstrated and reviewed posterior hip precautions and pt provided with handout Restrictions Weight Bearing Restrictions: Yes LLE Weight Bearing: Partial weight bearing LLE Partial Weight Bearing Percentage or Pounds: <50% Other Position/Activity Restrictions: PWB 50% L LE      Mobility  Bed Mobility Overal bed mobility: Needs Assistance Bed Mobility: Supine to Sit     Supine to sit: Min assist     General bed mobility comments: verbal cues for technique within precautions, assist for L LE  Transfers Overall transfer level: Needs assistance Equipment used: Rolling walker (2 wheeled) Transfers: Sit to/from Stand Sit to Stand: Min assist         General transfer comment: verbal cues UE and LE placement  Ambulation/Gait Ambulation/Gait assistance: Min guard Ambulation Distance (Feet): 60 Feet Assistive device: Rolling walker (2 wheeled) Gait Pattern/deviations: Step-to pattern;Antalgic Gait velocity: decr   General Gait Details: verbal cues for sequence, PWB, step length, posture  Stairs             Wheelchair Mobility    Modified Rankin (Stroke Patients Only)       Balance                                             Pertinent Vitals/Pain Pain Assessment: 0-10 Pain Score: 2  Pain Location: L hip Pain Descriptors / Indicators: Sore;Aching Pain Intervention(s): Monitored during session;Limited activity within patient's tolerance;Patient requesting pain meds-RN notified;Ice applied    Home Living Family/patient expects to be discharged to:: Skilled nursing facility Living Arrangements: Alone               Additional Comments: has walk in shower; has seat she can use in shower; has elevated toilet seat     Prior Function Level of Independence: Independent               Hand Dominance        Extremity/Trunk Assessment   Upper Extremity Assessment: Overall WFL for tasks assessed           Lower Extremity Assessment: LLE deficits/detail   LLE Deficits / Details: decreased functional hip strength observed     Communication   Communication: No difficulties  Cognition Arousal/Alertness: Awake/alert Behavior During Therapy: WFL for tasks assessed/performed Overall Cognitive Status: Within Functional Limits for tasks assessed                      General Comments      Exercises Total Joint Exercises Ankle Circles/Pumps: AROM;Both;15 reps  Quad Sets: AROM;Both;10 reps Towel Squeeze: AROM;Both;10 reps Short Arc Quad: AROM;Left;10 reps Heel Slides: AAROM;Left;10 reps;Other (comment) (within precautions) Hip ABduction/ADduction: AAROM;Left;10 reps      Assessment/Plan    PT Assessment Patient needs continued PT services  PT Diagnosis Difficulty walking   PT Problem List Decreased strength;Decreased mobility;Pain;Decreased knowledge of precautions;Decreased knowledge of use of DME  PT Treatment Interventions Gait training;DME instruction;Patient/family education;Functional mobility training;Therapeutic  activities;Therapeutic exercise   PT Goals (Current goals can be found in the Care Plan section) Acute Rehab PT Goals Patient Stated Goal: return home after rehab PT Goal Formulation: With patient Time For Goal Achievement: 04/04/14 Potential to Achieve Goals: Good    Frequency 7X/week   Barriers to discharge        Co-evaluation               End of Session   Activity Tolerance: Patient tolerated treatment well Patient left: in chair;with call bell/phone within reach           Time: 0856-0921 PT Time Calculation (min): 25 min   Charges:   PT Evaluation $Initial PT Evaluation Tier I: 1 Procedure PT Treatments $Gait Training: 8-22 mins $Therapeutic Exercise: 8-22 mins   PT G Codes:          Natalia Wittmeyer,KATHrine E 03/28/2014, 11:56 AM Carmelia Bake, PT, DPT 03/28/2014 Pager: [phone]

## 2014-03-28 NOTE — Progress Notes (Signed)
Clinical Social Work Department CLINICAL SOCIAL WORK PLACEMENT NOTE 03/28/2014  Patient:  Heather Fitzgerald, Heather Fitzgerald  Account Number:  [id number] Admit date:  03/27/2014  Clinical Social Worker:  Werner Lean, LCSW  Date/time:  03/28/2014 03:08 PM  Clinical Social Work is seeking post-discharge placement for this patient at the following level of care:   SKILLED NURSING   (*CSW will update this form in Epic as items are completed)     Patient/family provided with [place] Department of Clinical Social Work's list of facilities offering this level of care within the geographic area requested by the patient (or if unable, by the patient's family).  03/28/2014  Patient/family informed of their freedom to choose among providers that offer the needed level of care, that participate in Medicare, Medicaid or managed care program needed by the patient, have an available bed and are willing to accept the patient.    Patient/family informed of [facility]' ownership interest in [facility], as well as of the fact that they are under no obligation to receive care at this facility.  PASARR submitted to EDS on 03/28/2014 PASARR number received on 03/28/2014  FL2 transmitted to all facilities in geographic area requested by pt/family on  03/28/2014 FL2 transmitted to all facilities within larger geographic area on   Patient informed that his/her managed care company has contracts with or will negotiate with  certain facilities, including the following:     Patient/family informed of bed offers received:  03/28/2014 Patient chooses bed at [place] Physician recommends and patient chooses bed at    Patient to be transferred to  on   Patient to be transferred to facility by  Patient and family notified of transfer on  Name of family member notified:    The following physician request were entered in Epic:   Additional Comments:  Werner Lean LCSW [phone]

## 2014-03-28 NOTE — Progress Notes (Deleted)
Clinical Social Work Department BRIEF PSYCHOSOCIAL ASSESSMENT 03/28/2014  Patient:  Heather Fitzgerald     Account Number:  [id number]     Admit date:  03/27/2014  Clinical Social Worker:  Lacie Scotts  Date/Time:  03/28/2014 01:55 PM  Referred by:  Physician  Date Referred:  03/27/2014 Referred for  SNF Placement   Other Referral:   Interview type:  Patient Other interview type:    PSYCHOSOCIAL DATA Living Status:  ALONE Admitted from facility:   Level of care:   Primary support name:  Dutch Quint Primary support relationship to patient:  FRIEND Degree of support available:   unclear    CURRENT CONCERNS Current Concerns  Post-Acute Placement   Other Concerns:    SOCIAL WORK ASSESSMENT / PLAN Pt is a 81 yr. old female living at home prior to hospitalization. CSW met with pt to assist with d/c planning. This is a planned admission. Pt has made prior arrangements to have [place] [facility] following hospital d/c. SNF has been contacted and d/c plans have been confirmed. CSW will continue to follow to assist with d/c planning to SNF when stable.   Assessment/plan status:  Psychosocial Support/Ongoing Assessment of Needs Other assessment/ plan:   Information/referral to community resources:   Insurance coverage for SNF and ambulance transport reviewed.    PATIENT'S/FAMILY'S RESPONSE TO PLAN OF CARE: Pt's mood is bright. Pain is controlled. Pt is looking forward to having rehab at [facility]. Pvt room requested. " I had to share a room last time at [facility] and hoping I won't have to again."    [employer] LCSW [phone]

## 2014-03-28 NOTE — Progress Notes (Signed)
CARE MANAGEMENT NOTE 03/28/2014  Patient:  Heather Fitzgerald, Heather Fitzgerald   Account Number:  [id number]  Date Initiated:  03/28/2014  Documentation initiated by:  DAVIS,RHONDA  Subjective/Objective Assessment:   total left hip     Action/Plan:   plans to go to snf post hosp   Anticipated DC Date:  03/31/2014   Anticipated DC Plan:  [place]  In-house referral  Clinical Social Worker      DC Planning Services  CM consult      [facility] Choice  NA   Choice offered to / List presented to:  NA   DME arranged  NA      DME agency  NA     [place] arranged  NA      [place] agency  NA   Status of service:  In process, will continue to follow Medicare Important Message given?  NA - LOS <3 / Initial given by admissions (If response is "NO", the following Medicare IM given date fields will be blank) Date Medicare IM given:   Medicare IM given by:   Date Additional Medicare IM given:   Additional Medicare IM given by:    Discharge Disposition:    Per UR Regulation:  Reviewed for med. necessity/level of care/duration of stay  If discussed at [place] of Stay Meetings, dates discussed:    Comments:  Suanne Marker Davis,RN,BSN,CCM

## 2014-03-28 NOTE — Evaluation (Signed)
Occupational Therapy Evaluation Patient Details Name: Heather Fitzgerald MRN: [id number] DOB: 1933-10-05 Today's Date: 03/28/2014    History of Present Illness Pt is s/p conversion of failed left hip hemiarthroplasty to left total arthroplasty. Pt with history of HTN.   Clinical Impression   Pt doing well and is motivated to return to independence. She is overall at min assist level for toilet transfers and mod assist LB self care without AE. She is familiar with AE from previous surgery. She will benefit from continued OT services to maximize ADL independence for next venue.    Follow Up Recommendations  SNF;Supervision/Assistance - 24 hour    Equipment Recommendations  None recommended by OT    Recommendations for Other Services       Precautions / Restrictions Precautions Precautions: Posterior Hip Precaution Booklet Issued: Yes (comment) Precaution Comments: Hip precaution handout in room issued by PT. Reviewed all precautions with pt. She stated 2/3 independently at start of session Restrictions Weight Bearing Restrictions: Yes Other Position/Activity Restrictions: PWB 50% L LE      Mobility Bed Mobility                  Transfers Overall transfer level: Needs assistance Equipment used: Rolling walker (2 wheeled) Transfers: Sit to/from Stand Sit to Stand: Min assist         General transfer comment: verbal cues for L LE management and hand placement.    Balance                                            ADL Overall ADL's : Needs assistance/impaired Eating/Feeding: Independent;Sitting   Grooming: Wash/dry hands;Minimal assistance;Standing   Upper Body Bathing: Set up;Sitting   Lower Body Bathing: Moderate assistance;Sit to/from stand Lower Body Bathing Details (indicate cue type and reason): without AE Upper Body Dressing : Set up;Sitting   Lower Body Dressing: Moderate assistance;Sit to/from stand Lower Body Dressing Details  (indicate cue type and reason): without AE Toilet Transfer: Minimal assistance;Ambulation;RW;BSC   Toileting- Clothing Manipulation and Hygiene: Minimal assistance Toileting - Clothing Manipulation Details (indicate cue type and reason): standing       General ADL Comments: Pt has all AE and was using some of it even prior to this surgery.  She needs some cues for walker distance to self and for L LE management with transitions.     Vision                     Perception     Praxis      Pertinent Vitals/Pain Pain Assessment: 0-10 Pain Score: 1  Pain Location: L hip Pain Descriptors / Indicators: Sore Pain Intervention(s): Repositioned;Relaxation;Ice applied     Hand Dominance     Extremity/Trunk Assessment Upper Extremity Assessment Upper Extremity Assessment: Overall WFL for tasks assessed           Communication Communication Communication: No difficulties   Cognition Arousal/Alertness: Awake/alert Behavior During Therapy: WFL for tasks assessed/performed Overall Cognitive Status: Within Functional Limits for tasks assessed                     General Comments       Exercises       Shoulder Instructions      Home Living Family/patient expects to be discharged to:: Skilled nursing facility Living Arrangements: Alone  Additional Comments: has walk in shower; has seat she can use in shower; has elevated toilet seat       Prior Functioning/Environment Level of Independence: Independent             OT Diagnosis: Generalized weakness   OT Problem List: Decreased strength;Decreased knowledge of use of DME or AE   OT Treatment/Interventions: Self-care/ADL training;Patient/family education;Therapeutic activities;DME and/or AE instruction    OT Goals(Current goals can be found in the care plan section) Acute Rehab OT Goals Patient Stated Goal: return home after rehab OT Goal Formulation: With  patient Time For Goal Achievement: 04/04/14 Potential to Achieve Goals: Good  OT Frequency: Min 2X/week   Barriers to D/C:            Co-evaluation              End of Session Equipment Utilized During Treatment: Gait belt;Rolling walker  Activity Tolerance: Patient tolerated treatment well Patient left: in chair;with call bell/phone within reach   Time: 1009-1027 OT Time Calculation (min): 18 min Charges:  OT General Charges $OT Visit: 1 Procedure OT Evaluation $Initial OT Evaluation Tier I: 1 Procedure OT Treatments $Therapeutic Activity: 8-22 mins G-Codes:    Jules Schick [phone] 03/28/2014, 10:37 AM

## 2014-03-28 NOTE — Progress Notes (Signed)
Clinical Social Work Department BRIEF PSYCHOSOCIAL ASSESSMENT 03/28/2014  Patient:  Heather Fitzgerald, Heather Fitzgerald     Account Number:  [id number]     Admit date:  03/27/2014  Clinical Social Worker:  Lacie Scotts  Date/Time:  03/28/2014 03:00 PM  Referred by:  Physician  Date Referred:  03/27/2014 Referred for  SNF Placement   Other Referral:   Interview type:   Other interview type:    PSYCHOSOCIAL DATA Living Status:  ALONE Admitted from facility:   Level of care:   Primary support name:  Helane Gunther Primary support relationship to patient:  FRIEND Degree of support available:   unclear    CURRENT CONCERNS Current Concerns  Post-Acute Placement   Other Concerns:    SOCIAL WORK ASSESSMENT / PLAN Pt is a 80 yr old female living at home prior to hospitalization. CSW met with pt to assist with d/c planning. This is a planned admission. Pt has made prior arrangements to have ST Rehab at [facility] following hospitalization. SNF has been contacted and d/c plan has been confirmed. Pt has [employer]. Prior authorization has been received for SNF placement tomorrow if stable for d/c.   Assessment/plan status:  Psychosocial Support/Ongoing Assessment of Needs Other assessment/ plan:   Information/referral to community resources:   Insurance coverage for SNF placement and ambulance transport reviewed.    PATIENT'S/FAMILY'S RESPONSE TO PLAN OF CARE: Pt 's mood is bright. She is motivated to begin therapy. Pt is hopeful [facility] will have a pvt room for her. " I had to share a room last time I was there.  I hope they have a pvt room for me this time. ". Pvt room has been requested for pt.    Werner Lean LCSW [phone]

## 2014-03-28 NOTE — Progress Notes (Signed)
Patient ID: Heather Fitzgerald, female   DOB: [birth year], 80 y.o.   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

## 2014-03-28 NOTE — Progress Notes (Deleted)
Clinical Social Work Department CLINICAL SOCIAL WORK PLACEMENT NOTE 03/28/2014  Patient:  Heather Fitzgerald  Account Number:  [id number] Admit date:  03/27/2014  Clinical Social Worker:  Werner Lean, LCSW  Date/time:  03/28/2014 02:02 PM  Clinical Social Work is seeking post-discharge placement for this patient at the following level of care:      (*CSW will update this form in Epic as items are completed)     Patient/family provided with [place] Department of Clinical Social Work's list of facilities offering this level of care within the geographic area requested by the patient (or if unable, by the patient's family).  03/28/2014  Patient/family informed of their freedom to choose among providers that offer the needed level of care, that participate in Medicare, Medicaid or managed care program needed by the patient, have an available bed and are willing to accept the patient.    Patient/family informed of [facility]' ownership interest in [facility], as well as of the fact that they are under no obligation to receive care at this facility.  PASARR submitted to EDS on 03/28/2014 PASARR number received on 03/28/2014  FL2 transmitted to all facilities in geographic area requested by pt/family on  03/28/2014 FL2 transmitted to all facilities within larger geographic area on   Patient informed that his/her managed care company has contracts with or will negotiate with  certain facilities, including the following:     Patient/family informed of bed offers received:  03/28/2014 Patient chooses bed at  Physician recommends and patient chooses bed at  [place]  Patient to be transferred to  on   Patient to be transferred to facility by  Patient and family notified of transfer on  Name of family member notified:    The following physician request were entered in Epic:   Additional Comments:  Werner Lean LCSW [phone]

## 2014-03-29 DIAGNOSIS — D62 Acute posthemorrhagic anemia: Secondary | ICD-10-CM | POA: Diagnosis not present

## 2014-03-29 LAB — CBC
HEMATOCRIT: 34 % — AB (ref 36.0–46.0)
HEMOGLOBIN: 11.6 g/dL — AB (ref 12.0–15.0)
MCH: 30.9 pg (ref 26.0–34.0)
MCHC: 34.1 g/dL (ref 30.0–36.0)
MCV: 90.4 fL (ref 78.0–100.0)
Platelets: 120 10*3/uL — ABNORMAL LOW (ref 150–400)
RBC: 3.76 MIL/uL — ABNORMAL LOW (ref 3.87–5.11)
RDW: 14 % (ref 11.5–15.5)
WBC: 7.1 10*3/uL (ref 4.0–10.5)

## 2014-03-29 LAB — BASIC METABOLIC PANEL
Anion gap: 6 (ref 5–15)
BUN: 11 mg/dL (ref 6–23)
CALCIUM: 8.5 mg/dL (ref 8.4–10.5)
CO2: 32 meq/L (ref 19–32)
CREATININE: 0.57 mg/dL (ref 0.50–1.10)
Chloride: 104 mEq/L (ref 96–112)
GFR calc Af Amer: >90 mL/min (ref >90)
GFR calc non Af Amer: 87 mL/min — ABNORMAL LOW (ref >90)
GLUCOSE: 109 mg/dL — AB (ref 70–99)
Potassium: 3.8 mEq/L (ref 3.7–5.3)
Sodium: 142 mEq/L (ref 137–147)

## 2014-03-29 NOTE — Progress Notes (Signed)
Discharged from floor via w/c, EMT with pt. No changes in assessment. Renelda Kilian, [employer]

## 2014-03-29 NOTE — Care Management Note (Signed)
    Page 1 of 2   03/29/2014     2:14:26 PM CARE MANAGEMENT NOTE 03/29/2014  Patient:  Heather Fitzgerald, Heather Fitzgerald   Account Number:  [id number]  Date Initiated:  03/28/2014  Documentation initiated by:  DAVIS,RHONDA  Subjective/Objective Assessment:   total left hip     Action/Plan:   plans to go to snf post hosp   Anticipated DC Date:  03/31/2014   Anticipated DC Plan:  [place]  In-house referral  Clinical Social Worker      DC Planning Services  CM consult      [facility] Choice  NA   Choice offered to / List presented to:  NA   DME arranged  NA      DME agency  NA     [place] arranged  NA      [place] agency  NA   Status of service:  Completed, signed off Medicare Important Message given?  NA - LOS <3 / Initial given by admissions (If response is "NO", the following Medicare IM given date fields will be blank) Date Medicare IM given:   Medicare IM given by:   Date Additional Medicare IM given:   Additional Medicare IM given by:    Discharge Disposition:  [place]  Per UR Regulation:  Reviewed for med. necessity/level of care/duration of stay  If discussed at [place] of Stay Meetings, dates discussed:    Comments:  Suanne Marker Davis,RN,BSN,CCM

## 2014-03-29 NOTE — Plan of Care (Signed)
Problem: Discharge Progression Outcomes Goal: Anticoagulant follow-up in place Outcome: Not Applicable Date Met:  06/10/58 asa

## 2014-03-29 NOTE — Discharge Summary (Signed)
Physician Discharge Summary  Patient ID: Heather Fitzgerald MRN: [id number] DOB/AGE: 16-Jan-1934 80 y.o.  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 Paralee Cancel   Admission Diagnoses:   Left hip pain s/p left hip hemiarthroplasty  Discharge Diagnoses:  Principal Problem:   S/P left hip conversion Active Problems:   Postoperative anemia due to acute blood loss  Past Medical History  Diagnosis Date  . Hypertension   . GERD (gastroesophageal reflux disease)   . Asthma     hx of   . Anxiety   . Headache(784.0)   . Thrombocytopenia     HPI: Heather Fitzgerald, 80 y.o. female, has a history of pain and functional disability in the left hip due to trauma and patient has failed non-surgical conservative treatments for greater than 12 weeks to include NSAID's and/or analgesics, use of assistive devices and activity modification. The indications for the revision total hip arthroplasty are bearing surface wear leading to hip pain. Onset of symptoms was abrupt starting since having a hip hemiarthroplasty by Dr. Veverly Fells after a fal on May 12, 2013 with gradually worsening course since that time. Prior procedures on the left hip include hemi-arthroplasty. Patient currently rates pain in the left hip at 8 out of 10 with activity. There is night pain, worsening of pain with activity and weight bearing, trendelenberg gait, pain that interfers with activities of daily living and pain with passive range of motion. Patient has evidence of previous hemi arthroplasty, appearing to be in good position. by imaging studies. This condition presents safety issues increasing the risk of falls. There is no current active infection. Risks, benefits and expectations were discussed with the patient. Risks including but not limited to the risk of anesthesia, blood clots, nerve damage, blood  vessel damage, failure of the prosthesis, infection and up to and including death. Patient understand the risks, benefits and expectations and wishes to proceed with surgery.   PCP: No primary provider on file.   Discharged Condition: good  Hospital Course:  Patient underwent the above stated procedure on 03/27/2014. Patient tolerated the procedure well and brought to the recovery room in good condition and subsequently to the floor.  POD #1 BP: 139/46 ; Pulse: 59 ; Temp: 98.1 F (36.7 C) ; Resp: 15  Patient reports pain as mild. No events throughout the night.  Dorsiflexion/plantar flexion intact, incision: dressing C/D/I, no cellulitis present and compartment soft.   LABS  Basename    HGB  12.9  HCT  37.7   POD #2  BP: 140/57 ; Pulse: 60 ; Temp: 97.6 F (36.4 C)  ;Resp: 14  Patient reports pain as mild, pain controlled. No events throughout the night. Ready to be discharged to skilled nursing facility. Dorsiflexion/plantar flexion intact, incision: dressing C/D/I, no cellulitis present and compartment soft.   LABS  Basename    HGB  11.6  HCT  34.0    Discharge Exam: General appearance: alert, cooperative and no distress Extremities: Homans sign is negative, no sign of DVT, no edema, redness or tenderness in the calves or thighs and no ulcers, gangrene or trophic changes  Disposition:   [place] with follow up in 2 weeks   Follow-up Information   Follow up with Mauri Pole, MD. Schedule an appointment as soon as possible for a visit in 2 weeks.   Specialty:  Orthopedic Surgery  Contact information:   [address] [place] [address] [phone]       Discharge Instructions   Call MD / Call 911    Complete by:  As directed   If you experience chest pain or shortness of breath, CALL 911 and be transported to the hospital emergency room.  If you develope a fever above 101 F, pus (white drainage) or increased drainage or redness at  the wound, or calf pain, call your surgeon's office.     Change dressing    Complete by:  As directed   Maintain surgical dressing for 10-14 days, or until follow up in the clinic.     Constipation Prevention    Complete by:  As directed   Drink plenty of fluids.  Prune juice may be helpful.  You may use a stool softener, such as Colace (over the counter) 100 mg twice a day.  Use MiraLax (over the counter) for constipation as needed.     Diet - low sodium heart healthy    Complete by:  As directed      Discharge instructions    Complete by:  As directed   Maintain surgical dressing for 10-14 days, or until follow up in the clinic. Follow up in 2 weeks at [facility]. Call with any questions or concerns.     Partial weight bearing    Complete by:  As directed   % Body Weight:  50  Laterality:  left  Extremity:  Lower     TED hose    Complete by:  As directed   Use stockings (TED hose) for 2 weeks on both leg(s).  You may remove them at night for sleeping.             Medication List    STOP taking these medications       estrogens (conjugated) 0.625 MG tablet  Commonly known as:  PREMARIN     HYDROcodone-acetaminophen 5-325 MG per tablet  Commonly known as:  NORCO/VICODIN      TAKE these medications       acetaminophen 325 MG tablet  Commonly known as:  TYLENOL  Take 1-2 tablets (325-650 mg total) by mouth every 6 (six) hours as needed.     ALPRAZolam 0.5 MG tablet  Commonly known as:  XANAX  Take 0.25-0.5 mg by mouth 3 (three) times daily as needed for anxiety.     amLODipine 2.5 MG tablet  Commonly known as:  NORVASC  Take 2.5 mg by mouth every morning.     aspirin 325 MG EC tablet  Take 1 tablet (325 mg total) by mouth 2 (two) times daily.     cyclobenzaprine 10 MG tablet  Commonly known as:  FLEXERIL  Take 1 tablet (10 mg total) by mouth 3 (three) times daily as needed for muscle spasms.     DSS 100 MG Caps  Take 100 mg by mouth 2 (two) times  daily.     ferrous sulfate 325 (65 FE) MG tablet  Take 1 tablet (325 mg total) by mouth 3 (three) times daily after meals.     Magnesium 250 MG Tabs  Take 250 mg by mouth daily.     omeprazole 20 MG capsule  Commonly known as:  PRILOSEC  Take 20 mg by mouth daily as needed (acid reflux).     polyethylene glycol packet  Commonly known as:  MIRALAX / GLYCOLAX  Take 17 g by mouth 2 (two) times  daily.     traMADol 50 MG tablet  Commonly known as:  ULTRAM  Take 1-2 tablets (50-100 mg total) by mouth every 6 (six) hours as needed for moderate pain.         Signed: [place] Pugh. Theadora Noyes   PA-C  03/29/2014, 9:39 AM

## 2014-03-29 NOTE — Progress Notes (Signed)
     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 No events throughout the night. Ready to be discharged to skilled nursing facility.  Objective:   VITALS:   Filed Vitals:   03/29/14 0515  BP: 140/57  Pulse: 60  Temp: 97.6 F (36.4 C)  Resp: 14    Dorsiflexion/Plantar flexion intact Incision: dressing C/D/I No cellulitis present Compartment soft  LABS  Recent Labs  03/28/14 0500 03/29/14 0424  HGB 12.9 11.6*  HCT 37.7 34.0*  WBC 9.2 7.1  PLT 129* 120*     Recent Labs  03/28/14 0500 03/29/14 0424  NA 142 142  K 3.9 3.8  BUN 11 11  CREATININE 0.57 0.57  GLUCOSE 128* 109*     Assessment/Plan: 2 Days Post-Op Procedure(s) (LRB): CONVERSION OF FAILED LEFT HIP HEMI TO TOTAL HIP ARTHROPLASTY (Left) Up with therapy Discharge to SNF Follow up in 2 weeks at [facility]. Follow up with OLIN,Kalaya Infantino D in 2 weeks.  Contact information:  [facility] [address], [place] [phone]    Expected ABLA  Treated with iron and will observe        West Pugh. Lavanda Nevels   PAC  03/29/2014, 9:36 AM

## 2014-03-29 NOTE — Progress Notes (Signed)
Physical Therapy Treatment Patient Details Name: Heather Fitzgerald MRN: [id number] DOB: Nov 29, 1933 Today's Date: April 13, 2014    History of Present Illness Pt is s/p conversion of failed left hip hemiarthroplasty to left total arthroplasty. Pt with history of HTN.    PT Comments    Pt progressing well.  Plans to d/c to SNF today.  Follow Up Recommendations  SNF     Equipment Recommendations  None recommended by PT    Recommendations for Other Services       Precautions / Restrictions Precautions Precautions: Posterior Hip Precaution Comments: pt able to verbalize all precautions Restrictions Weight Bearing Restrictions: Yes LLE Weight Bearing: Partial weight bearing LLE Partial Weight Bearing Percentage or Pounds: <50%    Mobility  Bed Mobility               General bed mobility comments: pt up in recliner on arrival  Transfers Overall transfer level: Needs assistance Equipment used: Rolling walker (2 wheeled) Transfers: Sit to/from Stand Sit to Stand: Min guard         General transfer comment: verbal cues UE and LE placement  Ambulation/Gait Ambulation/Gait assistance: Min guard Ambulation Distance (Feet): 120 Feet Assistive device: Rolling walker (2 wheeled) Gait Pattern/deviations: Step-to pattern;Antalgic Gait velocity: decr   General Gait Details: verbal cues for  PWB, step length, posture   Stairs            Wheelchair Mobility    Modified Rankin (Stroke Patients Only)       Balance                                    Cognition Arousal/Alertness: Awake/alert Behavior During Therapy: WFL for tasks assessed/performed Overall Cognitive Status: Within Functional Limits for tasks assessed                      Exercises Total Joint Exercises Ankle Circles/Pumps: AROM;Both;15 reps Quad Sets: AROM;Both;15 reps Gluteal Sets: AROM;Both;15 reps Towel Squeeze: AROM;Both;10 reps Heel Slides: AAROM;Left;10 reps;Other  (comment) (within precautions) Hip ABduction/ADduction: AAROM;Left;10 reps Long Arc Quad: AROM;Left;10 reps    General Comments        Pertinent Vitals/Pain Pain Assessment: 0-10 Pain Score: 1  Pain Location: L hip Pain Descriptors / Indicators: Aching;Sore Pain Intervention(s): Monitored during session;Repositioned;Ice applied    Home Living                      Prior Function            PT Goals (current goals can now be found in the care plan section) Progress towards PT goals: Progressing toward goals    Frequency  7X/week    PT Plan Current plan remains appropriate    Co-evaluation             End of Session   Activity Tolerance: Patient tolerated treatment well Patient left: in chair;with call bell/phone within reach     Time: 0841-0904 PT Time Calculation (min): 23 min  Charges:  $Gait Training: 8-22 mins $Therapeutic Exercise: 8-22 mins                    G Codes:      Min Tunnell,KATHrine E 04/13/2014, 9:40 AM Carmelia Bake, PT, DPT 2014-04-13 Pager: [phone]

## 2014-03-29 NOTE — Progress Notes (Signed)
Clinical Social Work Department CLINICAL SOCIAL WORK PLACEMENT NOTE 03/29/2014  Patient:  Heather Fitzgerald, Heather Fitzgerald  Account Number:  [id number] Admit date:  03/27/2014  Clinical Social Worker:  Werner Lean, LCSW  Date/time:  03/29/2014 11:48 AM  Clinical Social Work is seeking post-discharge placement for this patient at the following level of care:   SKILLED NURSING   (*CSW will update this form in Epic as items are completed)     Patient/family provided with [place] Department of Clinical Social Work's list of facilities offering this level of care within the geographic area requested by the patient (or if unable, by the patient's family).  03/28/2014  Patient/family informed of their freedom to choose among providers that offer the needed level of care, that participate in Medicare, Medicaid or managed care program needed by the patient, have an available bed and are willing to accept the patient.    Patient/family informed of [facility]' ownership interest in [facility], as well as of the fact that they are under no obligation to receive care at this facility.  PASARR submitted to EDS on 03/28/2014 PASARR number received on 03/28/2014  FL2 transmitted to all facilities in geographic area requested by pt/family on  03/28/2014 FL2 transmitted to all facilities within larger geographic area on   Patient informed that his/her managed care company has contracts with or will negotiate with  certain facilities, including the following:     Patient/family informed of bed offers received:  03/28/2014 Patient chooses bed at [place] Physician recommends and patient chooses bed at    Patient to be transferred to [place] on  03/29/2014 Patient to be transferred to facility by [facility] Patient and family notified of transfer on 03/29/2014 Name of family member notified:  Pt declined CSW assistance.  The following physician request were entered in Epic:   Additional  Comments: Pt is in agreement with d/c to SNF today via [facility] transport. [employer] medic[employer] has provided authorization for SNF and transport. [facility] has reviewed d/c summary, scripts, avs. Scripts have been included in d/c packet.  Werner Lean LCSW [phone]

## 2014-03-30 ENCOUNTER — Other Ambulatory Visit: Payer: Self-pay | Admitting: *Deleted

## 2014-03-30 ENCOUNTER — Non-Acute Institutional Stay (SKILLED_NURSING_FACILITY): Payer: Medicare Other | Admitting: Internal Medicine

## 2014-03-30 DIAGNOSIS — I10 Essential (primary) hypertension: Secondary | ICD-10-CM

## 2014-03-30 DIAGNOSIS — M25559 Pain in unspecified hip: Secondary | ICD-10-CM

## 2014-03-30 DIAGNOSIS — M25552 Pain in left hip: Secondary | ICD-10-CM

## 2014-03-30 DIAGNOSIS — D62 Acute posthemorrhagic anemia: Secondary | ICD-10-CM

## 2014-03-30 DIAGNOSIS — K219 Gastro-esophageal reflux disease without esophagitis: Secondary | ICD-10-CM

## 2014-03-30 MED ORDER — TRAMADOL HCL 50 MG PO TABS: ORAL_TABLET | ORAL | Status: DC

## 2014-03-30 MED ORDER — ALPRAZOLAM 0.25 MG PO TABS: ORAL_TABLET | ORAL | Status: DC

## 2014-03-30 NOTE — Telephone Encounter (Signed)
[facility] 

## 2014-03-31 NOTE — Progress Notes (Signed)
HISTORY & PHYSICAL  DATE: 03/30/2014   FACILITY: [facility]  LEVEL OF CARE: SNF (31)  ALLERGIES:  Allergies  Allergen Reactions  . Iohexol      Code: HIVES, Desc: (+) SKIN TEST 64YRS AGO, OK W/ 1VP 15 YRS AGO, TODAY W/ HIVES, 50MG  BENADRYL PO.Marland KitchenPT OK, SUGGESTED 13 PRE MEDS FOR FUTURE IV CONTRAST PER DR.MATTERN//A.C.PT CALLS [place] 2 DAYS POST INJ, SHE HAS BEEN NAUSEATED SINCE EXAM & HAS STARTED TO ITCH AGAIN,,, WE RE, Onset Date: [id number]   . Sulfa Antibiotics Nausea Only  . Losartan Rash    CHIEF COMPLAINT:  Manage left hip pain, acute blood loss anemia and hypertension  HISTORY OF PRESENT ILLNESS: Patient is a 80 year old Caucasian female.  LEFT HIP PAIN: Patient has a history of left hip hemiarthroplasty. She has been having gradually worsening pain. Therefore she underwent conversion of the left hip hemiarthroplasty to a total hip arthroplasty and tolerated the procedure well. She is admitted to this facility for short-term rehabilitation. She denies left hip pain. She is tolerating her pain medications without any side effects.  ANEMIA: The anemia has been stable. The patient denies fatigue, melena or hematochezia. No complications from the medications currently being used. Postoperatively the patient suffered acute blood loss. Last hemoglobins are 12.9 and 11.6.  HTN: Pt 's HTN remains stable.  Denies CP, sob, DOE, pedal edema, headaches, dizziness or visual disturbances.  No complications from the medications currently being used.  Last BP : 129/68.  PAST MEDICAL HISTORY :  Past Medical History  Diagnosis Date  . Hypertension   . GERD (gastroesophageal reflux disease)   . Asthma     hx of   . Anxiety   . Headache(784.0)   . Thrombocytopenia     PAST SURGICAL HISTORY: Past Surgical History  Procedure Laterality Date  . Hip arthroplasty Left 05/13/2013    Procedure: ARTHROPLASTY BIPOLAR HIP;  Surgeon: Augustin Schooling, MD;  Location: [facility];   Service: Orthopedics;  Laterality: Left;  . Ovarian cyst surgery    . Ganglion cyst excision      right knee   . Abdominal hysterectomy    . Knee cyst surgery     . Left ankle surgery       due to fracture   . Eye surgery      bilateral cataract surgery   . Conversion to total hip Left 03/27/2014    Procedure: CONVERSION OF FAILED LEFT HIP HEMI TO TOTAL HIP ARTHROPLASTY;  Surgeon: Mauri Pole, MD;  Location: [facility];  Service: Orthopedics;  Laterality: Left;    SOCIAL HISTORY:  reports that she has never smoked. She has never used smokeless tobacco. She reports that she does not drink alcohol or use illicit drugs.  FAMILY HISTORY: None  CURRENT MEDICATIONS: Reviewed per MAR/see medication list  REVIEW OF SYSTEMS:  See HPI otherwise 14 point ROS is negative.  PHYSICAL EXAMINATION  VS:  See VS section  GENERAL: no acute distress, normal body habitus EYES: conjunctivae normal, sclerae normal, normal eye lids MOUTH/THROAT: lips without lesions,no lesions in the mouth,tongue is without lesions,uvula elevates in midline NECK: supple, trachea midline, no neck masses, no thyroid tenderness, no thyromegaly LYMPHATICS: no LAN in the neck, no supraclavicular LAN RESPIRATORY: breathing is even & unlabored, BS CTAB CARDIAC: RRR, no murmur,no extra heart sounds, no edema GI:  ABDOMEN: abdomen soft, normal BS, no masses, no tenderness  LIVER/SPLEEN: no hepatomegaly, no splenomegaly  MUSCULOSKELETAL: HEAD: normal to inspection  EXTREMITIES: LEFT UPPER EXTREMITY: full range of motion, normal strength & tone RIGHT UPPER EXTREMITY:  full range of motion, normal strength & tone LEFT LOWER EXTREMITY:   range of motion not tested due to surgery, normal strength & tone RIGHT LOWER EXTREMITY:  Moderate range of motion, normal strength & tone PSYCHIATRIC: the patient is alert & oriented to person, affect & behavior appropriate  LABS/RADIOLOGY:  Labs reviewed: Basic Metabolic Panel:  Recent  Labs  03/20/14 1020 03/28/14 0500 03/29/14 0424  NA 145 142 142  K 3.4* 3.9 3.8  CL 103 104 104  CO2 29 28 32  GLUCOSE 89 128* 109*  BUN 17 11 11   CREATININE 0.68 0.57 0.57  CALCIUM 9.6 8.6 8.5   CBC:  Recent Labs  03/20/14 1020 03/28/14 0500 03/29/14 0424  WBC 5.5 9.2 7.1  HGB 13.9 12.9 11.6*  HCT 41.2 37.7 34.0*  MCV 89.4 91.1 90.4  PLT 154 129* 120*   Cardiac Enzymes:  Recent Labs  05/13/13 1130  TROPONINI <0.30    PORTABLE LEFT HIP - 1 VIEW   COMPARISON:  CT pelvis 01/25/2014.   FINDINGS: Portable cross-table lateral view demonstrates satisfactory appearance status post conversion of a painful LEFT hip hemiarthroplasty to a LEFT THR. Satisfactory position and alignment on this lateral radiograph.   IMPRESSION: As above. PORTABLE PELVIS 1-2 VIEWS   COMPARISON:  CT 01/25/2014.   FINDINGS: Total left hip replacement. Good anatomic alignment. No acute bony abnormality. Diffuse osteopenia. Subtle change in right femoral head suggesting avascular necrosis as noted on prior CT of 01/25/2014. Pelvic phleboliths.   IMPRESSION: 1.  Good anatomic alignment status post total left hip replacement.   2. Subtle changes right femoral head again noted suggesting the possibility of avascular necrosis. Reference is made to CT report 01/25/2014.    ASSESSMENT/PLAN:  Left hip pain-status post conversation of failed hemiarthroplasty to a total hip arthroplasty. Continue rehabilitation. Acute blood loss anemia-check hemoglobin. Continue iron. Hypertension-well-controlled GERD-continue Prilosec Constipation-well-controlled Check CBC  I have reviewed patient's medical records received at admission/from hospitalization.  CPT CODE: 21194  Gayani Y Dasanayaka, [place] [phone]

## 2014-04-07 ENCOUNTER — Encounter: Payer: Self-pay | Admitting: Adult Health

## 2014-04-07 ENCOUNTER — Non-Acute Institutional Stay (SKILLED_NURSING_FACILITY): Payer: Medicare Other | Admitting: Adult Health

## 2014-04-07 DIAGNOSIS — K219 Gastro-esophageal reflux disease without esophagitis: Secondary | ICD-10-CM

## 2014-04-07 DIAGNOSIS — D62 Acute posthemorrhagic anemia: Secondary | ICD-10-CM

## 2014-04-07 DIAGNOSIS — Z96649 Presence of unspecified artificial hip joint: Secondary | ICD-10-CM

## 2014-04-07 DIAGNOSIS — I10 Essential (primary) hypertension: Secondary | ICD-10-CM

## 2014-04-07 DIAGNOSIS — K5900 Constipation, unspecified: Secondary | ICD-10-CM

## 2014-04-07 NOTE — Progress Notes (Signed)
Patient ID: Heather Fitzgerald, female   DOB: 11-09-1933, 80 y.o.   MRN: [id number]              PROGRESS NOTE  DATE: 04/07/2014   FACILITY: [facility]  LEVEL OF CARE: SNF (31)  Acute Visit  CHIEF COMPLAINT:  Discharge Notes  HISTORY OF PRESENT ILLNESS: This is a 80 year old female who is for discharge home with Home health PT and OT. She has been admitted to [facility] on 03/29/14 from [facility] Conversion of failed left hip hemi to total hip arthroplasty. Patient was admitted to this facility for short-term rehabilitation after the patient's recent hospitalization.  Patient has completed SNF rehabilitation and therapy has cleared the patient for discharge.  Reassessment of ongoing problem(s):  HTN: Pt 's HTN remains stable.  Denies CP, sob, DOE, headaches, dizziness or visual disturbances.  No complications from the medications currently being used.  Last BP : 137/74  ANEMIA: The anemia has been stable. The patient denies fatigue, melena or hematochezia. No complications from the medications currently being used. 8/15 hgb 11.1  GERD: pt's GERD is stable.  Denies ongoing heartburn, abd. Pain, nausea or vomiting.  Currently on a PPI & tolerates it without any adverse reactions.  PAST MEDICAL HISTORY : Reviewed.  No changes/see problem list  CURRENT MEDICATIONS: Reviewed per MAR/see medication list  REVIEW OF SYSTEMS:  GENERAL: no change in appetite, no fatigue, no weight changes, no fever, chills or weakness RESPIRATORY: no cough, SOB, DOE, wheezing, hemoptysis CARDIAC: no chest pain, or palpitations,  +edema GI: no abdominal pain, diarrhea, constipation, heart burn, nausea or vomiting  PHYSICAL EXAMINATION  GENERAL: no acute distress, normal body habitus EYES: conjunctivae normal, sclerae normal, normal eye lids NECK: supple, trachea midline, no neck masses, no thyroid tenderness, no thyromegaly LYMPHATICS: no LAN in the neck, no supraclavicular  LAN RESPIRATORY: breathing is even & unlabored, BS CTAB CARDIAC: RRR, no murmur,no extra heart sounds, LLE edema 1+ GI: abdomen soft, normal BS, no masses, no tenderness, no hepatomegaly, no splenomegaly EXTREMITIES: able to move all 4 extremities PSYCHIATRIC: the patient is alert & oriented to person, affect & behavior appropriate  LABS/RADIOLOGY: 03/31/14  WBC 4.3 hemoglobin 11.1 hematocrit 34.7 MCV 93.8 Labs reviewed: Basic Metabolic Panel:  Recent Labs  03/20/14 1020 03/28/14 0500 03/29/14 0424  NA 145 142 142  K 3.4* 3.9 3.8  CL 103 104 104  CO2 29 28 32  GLUCOSE 89 128* 109*  BUN 17 11 11   CREATININE 0.68 0.57 0.57  CALCIUM 9.6 8.6 8.5   CBC:  Recent Labs  03/20/14 1020 03/28/14 0500 03/29/14 0424  WBC 5.5 9.2 7.1  HGB 13.9 12.9 11.6*  HCT 41.2 37.7 34.0*  MCV 89.4 91.1 90.4  PLT 154 129* 120*   Cardiac Enzymes:  Recent Labs  05/13/13 1130  TROPONINI <0.30   EXAM: PORTABLE LEFT HIP - 1 VIEW   COMPARISON:  CT pelvis 01/25/2014.   FINDINGS: Portable cross-table lateral view demonstrates satisfactory appearance status post conversion of a painful LEFT hip hemiarthroplasty to a LEFT THR. Satisfactory position and alignment on this lateral radiograph.   IMPRESSION: As above. EXAM: PORTABLE PELVIS 1-2 VIEWS   COMPARISON:  CT 01/25/2014.   FINDINGS: Total left hip replacement. Good anatomic alignment. No acute bony abnormality. Diffuse osteopenia. Subtle change in right femoral head suggesting avascular necrosis as noted on prior CT of 01/25/2014. Pelvic phleboliths.   IMPRESSION: 1.  Good anatomic alignment status post  total left hip replacement.   2. Subtle changes right femoral head again noted suggesting the possibility of avascular necrosis. Reference is made to CT report 01/25/2014.   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  Patient will receive home health PT and OT.  Total discharge time: Less than 30 minutes  Discharge time involved coordination of the discharge process with Education officer, museum, nursing staff and therapy department. Medical justification for home health services verified.  CPT CODE: 03491  Seth Bake - NP [facility] [phone]

## 2014-04-09 ENCOUNTER — Emergency Department (HOSPITAL_COMMUNITY)
Admission: EM | Admit: 2014-04-09 | Discharge: 2014-04-09 | Disposition: A | Discharge status: Home / Self Care | Payer: Medicare Other | Attending: Emergency Medicine | Admitting: Emergency Medicine

## 2014-04-09 ENCOUNTER — Encounter (HOSPITAL_COMMUNITY): Payer: Self-pay | Admitting: Emergency Medicine

## 2014-04-09 DIAGNOSIS — M25579 Pain in unspecified ankle and joints of unspecified foot: Secondary | ICD-10-CM | POA: Insufficient documentation

## 2014-04-09 DIAGNOSIS — J45909 Unspecified asthma, uncomplicated: Secondary | ICD-10-CM | POA: Diagnosis not present

## 2014-04-09 DIAGNOSIS — D696 Thrombocytopenia, unspecified: Secondary | ICD-10-CM | POA: Diagnosis not present

## 2014-04-09 DIAGNOSIS — Z79899 Other long term (current) drug therapy: Secondary | ICD-10-CM | POA: Diagnosis not present

## 2014-04-09 DIAGNOSIS — F411 Generalized anxiety disorder: Secondary | ICD-10-CM | POA: Diagnosis not present

## 2014-04-09 DIAGNOSIS — M7989 Other specified soft tissue disorders: Secondary | ICD-10-CM

## 2014-04-09 DIAGNOSIS — K219 Gastro-esophageal reflux disease without esophagitis: Secondary | ICD-10-CM | POA: Diagnosis not present

## 2014-04-09 DIAGNOSIS — I10 Essential (primary) hypertension: Secondary | ICD-10-CM | POA: Insufficient documentation

## 2014-04-09 DIAGNOSIS — Z7982 Long term (current) use of aspirin: Secondary | ICD-10-CM | POA: Insufficient documentation

## 2014-04-09 LAB — CBC WITH DIFFERENTIAL/PLATELET
BASOS PCT: 0 % (ref 0–1)
Basophils Absolute: 0 10*3/uL (ref 0.0–0.1)
EOS ABS: 0.1 10*3/uL (ref 0.0–0.7)
Eosinophils Relative: 1 % (ref 0–5)
HEMATOCRIT: 36.7 % (ref 36.0–46.0)
HEMOGLOBIN: 12.2 g/dL (ref 12.0–15.0)
Lymphocytes Relative: 11 % — ABNORMAL LOW (ref 12–46)
Lymphs Abs: 0.8 10*3/uL (ref 0.7–4.0)
MCH: 30.5 pg (ref 26.0–34.0)
MCHC: 33.2 g/dL (ref 30.0–36.0)
MCV: 91.8 fL (ref 78.0–100.0)
MONO ABS: 0.4 10*3/uL (ref 0.1–1.0)
MONOS PCT: 5 % (ref 3–12)
Neutro Abs: 6 10*3/uL (ref 1.7–7.7)
Neutrophils Relative %: 83 % — ABNORMAL HIGH (ref 43–77)
Platelets: 157 10*3/uL (ref 150–400)
RBC: 4 MIL/uL (ref 3.87–5.11)
RDW: 13.9 % (ref 11.5–15.5)
WBC: 7.3 10*3/uL (ref 4.0–10.5)

## 2014-04-09 LAB — BASIC METABOLIC PANEL
Anion gap: 12 (ref 5–15)
BUN: 13 mg/dL (ref 6–23)
CHLORIDE: 101 meq/L (ref 96–112)
CO2: 27 mEq/L (ref 19–32)
Calcium: 8.8 mg/dL (ref 8.4–10.5)
Creatinine, Ser: 0.66 mg/dL (ref 0.50–1.10)
GFR calc Af Amer: >90 mL/min (ref >90)
GFR, EST NON AFRICAN AMERICAN: 83 mL/min — AB (ref >90)
GLUCOSE: 107 mg/dL — AB (ref 70–99)
POTASSIUM: 3.3 meq/L — AB (ref 3.7–5.3)
Sodium: 140 mEq/L (ref 137–147)

## 2014-04-09 NOTE — ED Provider Notes (Signed)
CSN: [id number]     Arrival date & time 04/09/14  1349 History   First MD Initiated Contact with Patient 04/09/14 1452     Chief Complaint  Patient presents with  . Ankle Pain    HPI Patient had recent left hip surgery earlier this month. This was performed on August 17. Patient's residing in a nursing facility. She started complaining of some ankle swelling and swelling of her lower leg up to her knee last evening. The staff at the facility did an x-ray of her ankle. They noted that she had a displaced screw in the distal fibula. Patient denies any pain in her ankle this time. She denies any fevers or chills. The patient states they were concerned about the possibility of a blood clot in her leg so she was sent to the emergency department. Patient denies any trouble with any chest pain or shortness of breath. Past Medical History  Diagnosis Date  . Hypertension   . GERD (gastroesophageal reflux disease)   . Asthma     hx of   . Anxiety   . Headache(784.0)   . Thrombocytopenia    Past Surgical History  Procedure Laterality Date  . Hip arthroplasty Left 05/13/2013    Procedure: ARTHROPLASTY BIPOLAR HIP;  Surgeon: Augustin Schooling, MD;  Location: [place];  Service: Orthopedics;  Laterality: Left;  . Ovarian cyst surgery    . Ganglion cyst excision      right knee   . Abdominal hysterectomy    . Knee cyst surgery     . Left ankle surgery       due to fracture   . Eye surgery      bilateral cataract surgery   . Conversion to total hip Left 03/27/2014    Procedure: CONVERSION OF FAILED LEFT HIP HEMI TO TOTAL HIP ARTHROPLASTY;  Surgeon: Mauri Pole, MD;  Location: [facility];  Service: Orthopedics;  Laterality: Left;   No family history on file. History  Substance Use Topics  . Smoking status: Never Smoker   . Smokeless tobacco: Never Used  . Alcohol Use: No   OB History   Grav Para Term Preterm Abortions TAB SAB Ect Mult Living                 Review of Systems  All other systems  reviewed and are negative.     Allergies  Iohexol; Sulfa antibiotics; and Losartan  Home Medications   Prior to Admission medications   Medication Sig Start Date End Date Taking? Authorizing Provider  acetaminophen (TYLENOL) 650 MG CR tablet Take 650 mg by mouth every 6 (six) hours as needed for pain or fever.   Yes Historical Provider, MD  ALPRAZolam (XANAX) 0.25 MG tablet Take 0.25 mg by mouth 3 (three) times daily as needed for anxiety.   Yes Historical Provider, MD  amLODipine (NORVASC) 2.5 MG tablet Take 2.5 mg by mouth daily with breakfast.    Yes Historical Provider, MD  aspirin EC 325 MG EC tablet Take 1 tablet (325 mg total) by mouth 2 (two) times daily. 03/28/14 04/25/14 Yes Lucille Passy Babish, PA-C  cyclobenzaprine (FLEXERIL) 10 MG tablet Take 1 tablet (10 mg total) by mouth 3 (three) times daily as needed for muscle spasms. 03/28/14  Yes Lucille Passy Babish, PA-C  docusate sodium 100 MG CAPS Take 100 mg by mouth 2 (two) times daily. 03/28/14  Yes Lucille Passy Babish, PA-C  ferrous sulfate 325 (65 FE) MG tablet Take 325  mg by mouth 2 (two) times daily with a meal.   Yes Historical Provider, MD  Magnesium 250 MG TABS Take 250 mg by mouth daily.   Yes Historical Provider, MD  omeprazole (PRILOSEC) 20 MG capsule Take 20 mg by mouth daily as needed (acid reflux).   Yes Historical Provider, MD  polyethylene glycol (MIRALAX / GLYCOLAX) packet Take 17 g by mouth 2 (two) times daily. 03/28/14  Yes Lucille Passy Babish, PA-C  traMADol (ULTRAM) 50 MG tablet Take 100 mg by mouth every 6 (six) hours as needed for moderate pain or severe pain.   Yes Historical Provider, MD   BP 170/62  Pulse 90  Temp(Src) 98.7 F (37.1 C) (Oral)  Resp 20  SpO2 100% Physical Exam  Nursing note and vitals reviewed. Constitutional: She appears well-developed and well-nourished. No distress.  HENT:  Head: Normocephalic and atraumatic.  Right Ear: External ear normal.  Left Ear: External ear normal.   Eyes: Conjunctivae are normal. Right eye exhibits no discharge. Left eye exhibits no discharge. No scleral icterus.  Neck: Neck supple. No tracheal deviation present.  Cardiovascular: Normal rate, regular rhythm and intact distal pulses.   Pulmonary/Chest: Effort normal and breath sounds normal. No stridor. No respiratory distress. She has no wheezes. She has no rales.  Abdominal: Soft. Bowel sounds are normal. She exhibits no distension. There is no tenderness. There is no rebound and no guarding.  Musculoskeletal: She exhibits edema. She exhibits no tenderness.  Mild edema the left lower extremity below the knee, no erythema, showing dorsalis pedis pulse on the left side, no palpable screw appreciated medial or lateral malleolus  Neurological: She is alert. She has normal strength. No cranial nerve deficit (no facial droop, extraocular movements intact, no slurred speech) or sensory deficit. She exhibits normal muscle tone. She displays no seizure activity. Coordination normal.  Skin: Skin is warm and dry. No rash noted.  Psychiatric: She has a normal mood and affect.    ED Course  Procedures (including critical care time) Labs Review Labs Reviewed - No data to display  Imaging Review No results found.   EKG Interpretation None      MDM    The patient's films from the nursing facility were not sent with her. Regardless, even if she has a displaced screw this does not require emergent treatment. The patient can follow up with her orthopedist.  Patient has had recent surgery and is at risk for venous thrombosis. We will get a venous Doppler study to assess for DVT.  Case will be turned over to Dr. Raeanne Barry, MD 04/09/14 [phone]

## 2014-04-09 NOTE — Progress Notes (Signed)
VASCULAR LAB PRELIMINARY  PRELIMINARY  PRELIMINARY  PRELIMINARY  Left lower extremity venous Doppler completed.    Preliminary report:  There is no DVT or SVT noted in the left lower extremity.   Kyle Luppino, RVT 04/09/2014, 4:40 PM

## 2014-04-09 NOTE — ED Notes (Signed)
US at bedside

## 2014-04-09 NOTE — ED Notes (Signed)
Bed: QT62 Expected date: 04/09/14 Expected time: 1:41 PM Means of arrival:  Comments: Ankle screw dislodged

## 2014-04-09 NOTE — Discharge Instructions (Signed)

## 2014-04-09 NOTE — ED Notes (Signed)
Pt friend Marijo File ) taking her home

## 2014-04-09 NOTE — ED Notes (Signed)
Spoke to [place] at [facility] she is aware of patient transported by friend. Pt was wheeled to the car by this RN. She is in stable condition upon discharge.

## 2014-04-09 NOTE — ED Notes (Signed)
Pt from [facility] c/o left ankle pain. The facility performed a xray last pm revealing a displaced screw in distal fibula. The original fracture occurred in 2009 but she re-injured in June by dropping a heater on it. She currently denies pain.

## 2014-11-14 ENCOUNTER — Other Ambulatory Visit (HOSPITAL_COMMUNITY): Payer: Self-pay | Admitting: Orthopedic Surgery

## 2014-11-14 ENCOUNTER — Ambulatory Visit (HOSPITAL_COMMUNITY)
Admission: RE | Admit: 2014-11-14 | Discharge: 2014-11-14 | Disposition: A | Discharge status: Home / Self Care | Payer: PPO | Source: Ambulatory Visit | Attending: Cardiology | Admitting: Cardiology

## 2014-11-14 DIAGNOSIS — M79605 Pain in left leg: Secondary | ICD-10-CM

## 2014-11-14 DIAGNOSIS — M79662 Pain in left lower leg: Secondary | ICD-10-CM | POA: Diagnosis not present

## 2014-11-14 NOTE — Progress Notes (Signed)
Left Lower Ext. Venous Duplex Completed. Negative for DVT or SVT in the left lower ext. Oda Cogan, BS, RDMS, RVT

## 2014-11-16 ENCOUNTER — Telehealth (HOSPITAL_COMMUNITY): Payer: Self-pay | Admitting: *Deleted

## 2015-02-05 ENCOUNTER — Other Ambulatory Visit: Payer: Self-pay

## 2015-09-20 DIAGNOSIS — Z96642 Presence of left artificial hip joint: Secondary | ICD-10-CM | POA: Diagnosis not present

## 2015-09-20 DIAGNOSIS — Z471 Aftercare following joint replacement surgery: Secondary | ICD-10-CM | POA: Diagnosis not present

## 2015-09-20 DIAGNOSIS — M25552 Pain in left hip: Secondary | ICD-10-CM | POA: Diagnosis not present

## 2015-09-20 DIAGNOSIS — M7062 Trochanteric bursitis, left hip: Secondary | ICD-10-CM | POA: Diagnosis not present

## 2015-09-25 DIAGNOSIS — D692 Other nonthrombocytopenic purpura: Secondary | ICD-10-CM | POA: Diagnosis not present

## 2015-09-25 DIAGNOSIS — D696 Thrombocytopenia, unspecified: Secondary | ICD-10-CM | POA: Diagnosis not present

## 2015-09-25 DIAGNOSIS — I8312 Varicose veins of left lower extremity with inflammation: Secondary | ICD-10-CM | POA: Diagnosis not present

## 2015-10-25 DIAGNOSIS — Z23 Encounter for immunization: Secondary | ICD-10-CM | POA: Diagnosis not present

## 2015-11-01 DIAGNOSIS — Z23 Encounter for immunization: Secondary | ICD-10-CM | POA: Diagnosis not present

## 2015-11-21 DIAGNOSIS — M7062 Trochanteric bursitis, left hip: Secondary | ICD-10-CM | POA: Diagnosis not present

## 2015-12-11 DIAGNOSIS — Z23 Encounter for immunization: Secondary | ICD-10-CM | POA: Diagnosis not present

## 2015-12-28 DIAGNOSIS — M7062 Trochanteric bursitis, left hip: Secondary | ICD-10-CM | POA: Diagnosis not present

## 2016-01-03 DIAGNOSIS — R350 Frequency of micturition: Secondary | ICD-10-CM | POA: Diagnosis not present

## 2016-04-09 DIAGNOSIS — H02831 Dermatochalasis of right upper eyelid: Secondary | ICD-10-CM | POA: Diagnosis not present

## 2016-04-09 DIAGNOSIS — Z961 Presence of intraocular lens: Secondary | ICD-10-CM | POA: Diagnosis not present

## 2016-04-09 DIAGNOSIS — Z83511 Family history of glaucoma: Secondary | ICD-10-CM | POA: Diagnosis not present

## 2016-04-09 DIAGNOSIS — H01001 Unspecified blepharitis right upper eyelid: Secondary | ICD-10-CM | POA: Diagnosis not present

## 2016-04-09 DIAGNOSIS — H52203 Unspecified astigmatism, bilateral: Secondary | ICD-10-CM | POA: Diagnosis not present

## 2016-04-09 DIAGNOSIS — H35363 Drusen (degenerative) of macula, bilateral: Secondary | ICD-10-CM | POA: Diagnosis not present

## 2016-04-09 DIAGNOSIS — H47233 Glaucomatous optic atrophy, bilateral: Secondary | ICD-10-CM | POA: Diagnosis not present

## 2016-04-09 DIAGNOSIS — H04123 Dry eye syndrome of bilateral lacrimal glands: Secondary | ICD-10-CM | POA: Diagnosis not present

## 2016-05-09 DIAGNOSIS — D485 Neoplasm of uncertain behavior of skin: Secondary | ICD-10-CM | POA: Diagnosis not present

## 2016-05-09 DIAGNOSIS — L821 Other seborrheic keratosis: Secondary | ICD-10-CM | POA: Diagnosis not present

## 2016-05-09 DIAGNOSIS — L570 Actinic keratosis: Secondary | ICD-10-CM | POA: Diagnosis not present

## 2016-05-15 DIAGNOSIS — I10 Essential (primary) hypertension: Secondary | ICD-10-CM | POA: Diagnosis not present

## 2016-05-15 DIAGNOSIS — Z0000 Encounter for general adult medical examination without abnormal findings: Secondary | ICD-10-CM | POA: Diagnosis not present

## 2016-05-15 DIAGNOSIS — E782 Mixed hyperlipidemia: Secondary | ICD-10-CM | POA: Diagnosis not present

## 2016-05-15 DIAGNOSIS — D692 Other nonthrombocytopenic purpura: Secondary | ICD-10-CM | POA: Diagnosis not present

## 2016-05-15 DIAGNOSIS — D696 Thrombocytopenia, unspecified: Secondary | ICD-10-CM | POA: Diagnosis not present

## 2016-05-15 DIAGNOSIS — I8312 Varicose veins of left lower extremity with inflammation: Secondary | ICD-10-CM | POA: Diagnosis not present

## 2016-05-15 DIAGNOSIS — Z23 Encounter for immunization: Secondary | ICD-10-CM | POA: Diagnosis not present

## 2016-05-22 DIAGNOSIS — I8312 Varicose veins of left lower extremity with inflammation: Secondary | ICD-10-CM | POA: Diagnosis not present

## 2016-05-22 DIAGNOSIS — E782 Mixed hyperlipidemia: Secondary | ICD-10-CM | POA: Diagnosis not present

## 2016-05-22 DIAGNOSIS — D692 Other nonthrombocytopenic purpura: Secondary | ICD-10-CM | POA: Diagnosis not present

## 2016-05-22 DIAGNOSIS — Z780 Asymptomatic menopausal state: Secondary | ICD-10-CM | POA: Diagnosis not present

## 2016-05-22 DIAGNOSIS — M8580 Other specified disorders of bone density and structure, unspecified site: Secondary | ICD-10-CM | POA: Diagnosis not present

## 2016-05-22 DIAGNOSIS — D696 Thrombocytopenia, unspecified: Secondary | ICD-10-CM | POA: Diagnosis not present

## 2016-08-08 ENCOUNTER — Ambulatory Visit (HOSPITAL_COMMUNITY)
Admission: EM | Admit: 2016-08-08 | Discharge: 2016-08-08 | Disposition: A | Discharge status: Other Acute Inpatient Hospital | Payer: PPO

## 2016-08-08 NOTE — ED Notes (Signed)
Pt was briefly brought back to a room where it was discovered the sound she was hearing was from her phone.  Pt did not see an MD and I did not do any VS.  Pt will be taken out of the system without being seen.

## 2016-08-14 DIAGNOSIS — D1801 Hemangioma of skin and subcutaneous tissue: Secondary | ICD-10-CM | POA: Diagnosis not present

## 2016-09-08 DIAGNOSIS — Z01419 Encounter for gynecological examination (general) (routine) without abnormal findings: Secondary | ICD-10-CM | POA: Diagnosis not present

## 2016-09-08 DIAGNOSIS — Z1231 Encounter for screening mammogram for malignant neoplasm of breast: Secondary | ICD-10-CM | POA: Diagnosis not present

## 2016-09-08 DIAGNOSIS — Z6822 Body mass index (BMI) 22.0-22.9, adult: Secondary | ICD-10-CM | POA: Diagnosis not present

## 2016-09-08 DIAGNOSIS — R3915 Urgency of urination: Secondary | ICD-10-CM | POA: Diagnosis not present

## 2016-10-06 DIAGNOSIS — H47233 Glaucomatous optic atrophy, bilateral: Secondary | ICD-10-CM | POA: Diagnosis not present

## 2016-10-06 DIAGNOSIS — Z961 Presence of intraocular lens: Secondary | ICD-10-CM | POA: Diagnosis not present

## 2016-10-06 DIAGNOSIS — H04123 Dry eye syndrome of bilateral lacrimal glands: Secondary | ICD-10-CM | POA: Diagnosis not present

## 2016-10-06 DIAGNOSIS — Z83511 Family history of glaucoma: Secondary | ICD-10-CM | POA: Diagnosis not present

## 2016-12-15 DIAGNOSIS — S70361A Insect bite (nonvenomous), right thigh, initial encounter: Secondary | ICD-10-CM | POA: Diagnosis not present

## 2016-12-15 DIAGNOSIS — R5383 Other fatigue: Secondary | ICD-10-CM | POA: Diagnosis not present

## 2016-12-15 DIAGNOSIS — N390 Urinary tract infection, site not specified: Secondary | ICD-10-CM | POA: Diagnosis not present

## 2016-12-22 DIAGNOSIS — W57XXXD Bitten or stung by nonvenomous insect and other nonvenomous arthropods, subsequent encounter: Secondary | ICD-10-CM | POA: Diagnosis not present

## 2016-12-22 DIAGNOSIS — S70361D Insect bite (nonvenomous), right thigh, subsequent encounter: Secondary | ICD-10-CM | POA: Diagnosis not present

## 2016-12-22 DIAGNOSIS — F329 Major depressive disorder, single episode, unspecified: Secondary | ICD-10-CM | POA: Diagnosis not present

## 2016-12-24 DIAGNOSIS — Z96642 Presence of left artificial hip joint: Secondary | ICD-10-CM | POA: Diagnosis not present

## 2016-12-24 DIAGNOSIS — Z471 Aftercare following joint replacement surgery: Secondary | ICD-10-CM | POA: Diagnosis not present

## 2016-12-24 DIAGNOSIS — S7002XA Contusion of left hip, initial encounter: Secondary | ICD-10-CM | POA: Diagnosis not present

## 2017-03-28 ENCOUNTER — Encounter (HOSPITAL_COMMUNITY): Payer: Self-pay | Admitting: Emergency Medicine

## 2017-03-28 ENCOUNTER — Ambulatory Visit (HOSPITAL_COMMUNITY)
Admission: EM | Admit: 2017-03-28 | Discharge: 2017-03-28 | Disposition: A | Discharge status: Home / Self Care | Payer: PPO | Attending: Family | Admitting: Family

## 2017-03-28 ENCOUNTER — Ambulatory Visit (INDEPENDENT_AMBULATORY_CARE_PROVIDER_SITE_OTHER): Payer: PPO

## 2017-03-28 DIAGNOSIS — S92901A Unspecified fracture of right foot, initial encounter for closed fracture: Secondary | ICD-10-CM

## 2017-03-28 DIAGNOSIS — M7989 Other specified soft tissue disorders: Secondary | ICD-10-CM | POA: Diagnosis not present

## 2017-03-28 DIAGNOSIS — I159 Secondary hypertension, unspecified: Secondary | ICD-10-CM

## 2017-03-28 NOTE — ED Triage Notes (Signed)
Pt reports she inj her right foot yest around 1700  Sts a 47ftx1ft piece of marble fell out of van and onto her foot  Sx today today include bruising, pain and swelling.... Pt unable to bear wt  Brought back on wheel chair... A&O x4... NAD.

## 2017-03-28 NOTE — Discharge Instructions (Signed)
As discussed, I spoke with [facility].  You may wear a hard sole shoe and weight bearing as tolerated Rest, ice, elevation are essential  Please call [facility] and make an appointment with either Dr Lyla Glassing or your own orthopedic for 2 weeks out unless you develop new or worsening symptoms, then I would advise you to call them sooner.   Please also take your amlodipine and monitor your blood pressure AS SOON AS YOU GET  HOME. If continues to be high, you must return to urgent care or emergency room.

## 2017-03-28 NOTE — ED Provider Notes (Signed)
[place]    CSN: [id number] Arrival date & time: 03/28/17  1252     History   Chief Complaint Chief Complaint  Patient presents with  . Foot Injury    HPI Heather Fitzgerald is a 83 y.o. female.   CC:  Right foot pain x one day, worsening  Unable to bear weight. No numbness, intense pain, fever, N, vomiting.  When she is off of her foot, she states pain is bearable.   States marble slab fell on foot yesterday when loading a truck.   She also reports red bumps on bilateral chins which she noticed yesterday, unchanged. Not itching.has tried any medication.    H/o HTN- on amlodipine which she did not take today. Denies exertional chest pain or pressure, numbness or tingling radiating to left arm or jaw, palpitations, dizziness, frequent headaches, changes in vision, or shortness of breath.   Reports left hip fracture and also left ankle fracture in past.   No h/o osteopenia      Past Medical History:  Diagnosis Date  . Anxiety   . Asthma    hx of   . GERD (gastroesophageal reflux disease)   . Headache(784.0)   . Hypertension   . Thrombocytopenia [facility])     Patient Active Problem List   Diagnosis Date Noted  . Postoperative anemia due to acute blood loss 03/29/2014  . S/P left hip conversion 03/27/2014  . Constipation 05/17/2013  . Acute blood loss anemia 05/17/2013  . Pain in left hip 05/17/2013  . Essential hypertension, benign 05/13/2013  . Thrombocytopenia, unspecified ([place]) 05/13/2013  . GERD (gastroesophageal reflux disease) 05/13/2013  . Intertrochanteric fracture of left hip ([place]) 05/12/2013    Past Surgical History:  Procedure Laterality Date  . ABDOMINAL HYSTERECTOMY    . CONVERSION TO TOTAL HIP Left 03/27/2014   Procedure: CONVERSION OF FAILED LEFT HIP HEMI TO TOTAL HIP ARTHROPLASTY;  Surgeon: Mauri Pole, MD;  [facility];  Service: Orthopedics;  Laterality: Left;  . EYE SURGERY     bilateral cataract surgery   . GANGLION  CYST EXCISION     right knee   . HIP ARTHROPLASTY Left 05/13/2013   Procedure: ARTHROPLASTY BIPOLAR HIP;  Surgeon: Augustin Schooling, MD;  Location: [place];  Service: Orthopedics;  Laterality: Left;  . knee cyst surgery     . left ankle surgery      due to fracture   . OVARIAN CYST SURGERY      OB History    No data available       Home Medications    Prior to Admission medications   Medication Sig Start Date End Date Taking? Authorizing Provider  amLODipine (NORVASC) 2.5 MG tablet Take 2.5 mg by mouth daily with breakfast.    Yes [provider]  omeprazole (PRILOSEC) 20 MG capsule Take 20 mg by mouth daily as needed (acid reflux).   Yes [provider]  acetaminophen (TYLENOL) 650 MG CR tablet Take 650 mg by mouth every 6 (six) hours as needed for pain or fever.    [provider]  ALPRAZolam Duanne Moron) 0.25 MG tablet Take 0.25 mg by mouth 3 (three) times daily as needed for anxiety.    [provider]  cyclobenzaprine (FLEXERIL) 10 MG tablet Take 1 tablet (10 mg total) by mouth 3 (three) times daily as needed for muscle spasms. 03/28/14   Danae Orleans, PA-C  docusate sodium 100 MG CAPS Take 100 mg by mouth 2 (  two) times daily. 03/28/14   Danae Orleans, PA-C  ferrous sulfate 325 (65 FE) MG tablet Take 325 mg by mouth 2 (two) times daily with a meal.    [provider]  Magnesium 250 MG TABS Take 250 mg by mouth daily.    [provider]  polyethylene glycol (MIRALAX / GLYCOLAX) packet Take 17 g by mouth 2 (two) times daily. 03/28/14   Danae Orleans, PA-C  traMADol (ULTRAM) 50 MG tablet Take 100 mg by mouth every 6 (six) hours as needed for moderate pain or severe pain.    [provider]    Family History History reviewed. No pertinent family history.  Social History Social History  Substance Use Topics  . Smoking status: Never Smoker  . Smokeless tobacco: Never Used  . Alcohol use No     Allergies   Iohexol;  Sulfa antibiotics; and Losartan   Review of Systems Review of Systems  Constitutional: Negative for chills and fever.  Respiratory: Negative for cough and shortness of breath.   Cardiovascular: Negative for chest pain and palpitations.  Gastrointestinal: Negative for nausea and vomiting.  Musculoskeletal: Positive for joint swelling.  Skin: Positive for color change and rash. Negative for wound.  Neurological: Negative for numbness.     Physical Exam Triage Vital Signs ED Triage Vitals  Enc Vitals Group     BP 03/28/17 1343 (!) 170/73     Pulse Rate 03/28/17 1343 (!) 51     Resp 03/28/17 1343 20     Temp 03/28/17 1343 98.2 F (36.8 C)     Temp Source 03/28/17 1343 Oral     SpO2 03/28/17 1343 100 %     Weight --      Height --      Head Circumference --      Peak Flow --      Pain Score 03/28/17 1345 3     Pain Loc --      Pain Edu? --      Excl. in [place]? --    No data found.   Updated Vital Signs BP (!) 185/77   Pulse (!) 51   Temp 98.2 F (36.8 C) (Oral)   Resp 20   SpO2 100%   Visual Acuity Right Eye Distance:   Left Eye Distance:   Bilateral Distance:    Right Eye Near:   Left Eye Near:    Bilateral Near:     Physical Exam  Constitutional: She appears well-developed and well-nourished.  HENT:  Mouth/Throat: Uvula is midline, oropharynx is clear and moist and mucous membranes are normal.  Eyes: Pupils are equal, round, and reactive to light. Conjunctivae and EOM are normal.  Fundus normal bilaterally.   Cardiovascular: Normal rate, regular rhythm, normal heart sounds and normal pulses.   Pulmonary/Chest: Effort normal and breath sounds normal. She has no wheezes. She has no rhonchi. She has no rales.  Musculoskeletal:       Right ankle: Normal. She exhibits normal range of motion, no swelling and no ecchymosis.       Right foot: There is decreased range of motion, tenderness, bony tenderness and swelling. There is normal capillary refill, no deformity  and no laceration.  Diffuse ecchymosis, edema over right foot. No laceration, or firm feeling appreciated. Sensation intact. No pallor.   Neurological: She is alert. She has normal strength. No cranial nerve deficit or sensory deficit. She displays a negative Romberg sign.  Reflex Scores:  Bicep reflexes are 2+ on the right side and 2+ on the left side.      Patellar reflexes are 2+ on the right side and 2+ on the left side. Grip equal and strong bilateral upper extremities. Gait strong and steady. Able to perform rapid alternating movement without difficulty.   Skin: Skin is warm and dry. Rash noted. No petechiae noted. Rash is macular.  Scattered macular lesions bilateral anterior aspects BLE.  No purulent discharge, streaking, increased warmth.   Psychiatric: She has a normal mood and affect. Her speech is normal and behavior is normal. Thought content normal.  Vitals reviewed.     UC Treatments / Results  Labs (all labs ordered are listed, but only abnormal results are displayed) Labs Reviewed - No data to display  EKG  EKG Interpretation None       Radiology Dg Foot Complete Right  Result Date: 03/28/2017 CLINICAL DATA:  Dropped heavy object on foot. EXAM: RIGHT FOOT COMPLETE - 3+ VIEW COMPARISON:  None. FINDINGS: Frontal, oblique, and lateral views were obtained. There is soft tissue swelling over the forefoot and midfoot regions. There is a transversely oriented fracture at the junction of mid and distal thirds of the second metatarsal, with alignment anatomic. There is chronic erosion of the proximal aspect of the fifth proximal phalanx with chronic dislocation in this area. Specifically, the fifth proximal phalanx is dislocated medial to the distal fifth metatarsal. No other dislocation. There is hallux valgus deformity at the first MTP joint with bunion formation in the distal first metatarsal region. There is bony overgrowth in this area with cystic type change. There is  moderate narrowing of the first MTP joint. Other joint spaces appear unremarkable. Valgus IMPRESSION: 1. Transversely oriented fracture at the junction of mid and distal thirds of the second metatarsal with anatomic alignment. 2. Chronic erosion along the proximal aspect of the fifth proximal phalanx with dislocation at the fifth MTP joint, chronic in appearance. 3. 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 Bunion formation medially in this area with cystic type change along the medial distal first metatarsal. No erosion in this area. 4. Soft tissue swelling throughout the dorsal mid and forefoot regions. These results will be called to the ordering clinician or representative by the Radiologist Assistant, and communication documented in the PACS or zVision Dashboard. Electronically Signed   By: Lowella Grip III M.D.   On: 03/28/2017 15:20    Procedures Procedures (including critical care time)  Medications Ordered in UC Medications - No data to display   Initial Impression / Assessment and Plan / UC Course  I have reviewed the triage vital signs and the nursing notes.  Pertinent labs & imaging results that were available during my care of the patient were reviewed by me and considered in my medical decision making (see chart for details).       Final Clinical Impressions(s) / UC Diagnoses   Final diagnoses:  Closed fracture of right foot, initial encounter  Secondary hypertension  Consulted with Dr Delfino Lovett, [facility] and gave advice follow-up with orthopedics in 2 weeks. He advised me to have patient weightbearing, hard sole shoe, rest, ice, elevation. Education provided to patient about signs and symptoms of compartment syndrome including numbness, increased pain, swelling. Patient verbalized understanding. Patient's blood pressure today is quite high. She has no signs or symptoms of hypertensive urgency or emergency  at this time. She has not taking her  blood pressure medication today also suspect pain has increased this. Reassured by normal neurologic exam. Advised patient take amlodipine and monitor blood pressure as soon as she gets home. Advised her to return to emergency room, urgent care if blood pressure does not become well controlled TODAY. Patient verbalized understanding. Return precautions given.  New Prescriptions Discharge Medication List as of 03/28/2017  3:57 PM       Controlled Substance Prescriptions [place] Controlled Substance Registry consulted? Not Applicable   Burnard Hawthorne, [place] 03/28/17 [phone]

## 2017-03-30 ENCOUNTER — Encounter (INDEPENDENT_AMBULATORY_CARE_PROVIDER_SITE_OTHER): Payer: Self-pay | Admitting: Orthopedic Surgery

## 2017-03-30 ENCOUNTER — Ambulatory Visit (INDEPENDENT_AMBULATORY_CARE_PROVIDER_SITE_OTHER): Payer: PPO | Admitting: Orthopedic Surgery

## 2017-03-30 VITALS — Ht 62.0 in | Wt 126.0 lb

## 2017-03-30 DIAGNOSIS — S92321A Displaced fracture of second metatarsal bone, right foot, initial encounter for closed fracture: Secondary | ICD-10-CM

## 2017-03-30 NOTE — Progress Notes (Signed)
Office Visit Note   Patient: Heather Fitzgerald           Date of Birth: 03-Feb-1934           MRN: [id number] Visit Date: 03/30/2017              Requested by: Merrilee Seashore, [employer] [address] [place], [place] [address] PCP: Merrilee Seashore, MD  Chief Complaint  Patient presents with  . Right Foot - Follow-up    ER 03/28/17 after marble slab fell on foot while helping her son move.       HPI: The patient is an 83 year old woman who states she was assisting her son to move over the weekend. she opened the tail gate and a marble cutting board fell out onto her foot. She was seen and evaluated in the care and told she had a second metatarsal fracture. Today she is full weightbearing in her regular shoe wear. States she has to limp due to pain has been icing and elevating. Tylenol for pain  Assessment & Plan: Visit Diagnoses:  1. Closed displaced fracture of second metatarsal bone of right foot, initial encounter     Plan: Provided a postop shoe for ambulation will follow-up in office 2 weeks with repeat radiographs. May use ice continue to elevate.  Follow-Up Instructions: Return in about 2 weeks (around 04/13/2017).   Ortho Exam  Patient is alert, oriented, no adenopathy, well-dressed, normal affect, normal respiratory effort. On examination of her right foot: Has bunion deformity. There is ecchymosis generalized and swelling over her foot she's tender along the second metatarsal. strong dorsalis pedis pulse.  Imaging: No results found. No images are attached to the encounter.  Labs: No results found for: HGBA1C, ESRSEDRATE, CRP, LABURIC, REPTSTATUS, GRAMSTAIN, CULT, LABORGA  Orders:  No orders of the defined types were placed in this encounter.  No orders of the defined types were placed in this encounter.    Procedures: No procedures performed  Clinical Data: No additional findings.  ROS:  All other systems negative, except as noted in  the HPI. Review of Systems  Constitutional: Negative for chills and fever.  Musculoskeletal: Positive for arthralgias and joint swelling.  Skin: Positive for color change.    Objective: Vital Signs: Ht 5\' 2"  (1.575 m)   Wt 126 lb (57.2 kg)   BMI 23.05 kg/m   Specialty Comments:  No specialty comments available.  PMFS History: Patient Active Problem List   Diagnosis Date Noted  . Postoperative anemia due to acute blood loss 03/29/2014  . S/P left hip conversion 03/27/2014  . Constipation 05/17/2013  . Acute blood loss anemia 05/17/2013  . Pain in left hip 05/17/2013  . Essential hypertension, benign 05/13/2013  . Thrombocytopenia, unspecified ([place]) 05/13/2013  . GERD (gastroesophageal reflux disease) 05/13/2013  . Intertrochanteric fracture of left hip ([place]) 05/12/2013   Past Medical History:  Diagnosis Date  . Anxiety   . Asthma    hx of   . GERD (gastroesophageal reflux disease)   . Headache(784.0)   . Hypertension   . Thrombocytopenia ([place])     No family history on file.  Past Surgical History:  Procedure Laterality Date  . ABDOMINAL HYSTERECTOMY    . CONVERSION TO TOTAL HIP Left 03/27/2014   Procedure: CONVERSION OF FAILED LEFT HIP HEMI TO TOTAL HIP ARTHROPLASTY;  Surgeon: Mauri Pole, MD;  [facility];  Service: Orthopedics;  Laterality: Left;  . EYE SURGERY     bilateral  cataract surgery   . GANGLION CYST EXCISION     right knee   . HIP ARTHROPLASTY Left 05/13/2013   Procedure: ARTHROPLASTY BIPOLAR HIP;  Surgeon: Augustin Schooling, MD;  Location: [place];  Service: Orthopedics;  Laterality: Left;  . knee cyst surgery     . left ankle surgery      due to fracture   . OVARIAN CYST SURGERY     Social History   Occupational History  . Not on file.   Social History Main Topics  . Smoking status: Never Smoker  . Smokeless tobacco: Never Used  . Alcohol use No  . Drug use: No  . Sexual activity: Not on file

## 2017-04-06 ENCOUNTER — Ambulatory Visit (INDEPENDENT_AMBULATORY_CARE_PROVIDER_SITE_OTHER): Payer: BC Managed Care – PPO | Admitting: Orthopedic Surgery

## 2017-04-14 ENCOUNTER — Encounter (INDEPENDENT_AMBULATORY_CARE_PROVIDER_SITE_OTHER): Payer: Self-pay | Admitting: Orthopedic Surgery

## 2017-04-14 ENCOUNTER — Ambulatory Visit (INDEPENDENT_AMBULATORY_CARE_PROVIDER_SITE_OTHER): Payer: PPO

## 2017-04-14 ENCOUNTER — Ambulatory Visit (INDEPENDENT_AMBULATORY_CARE_PROVIDER_SITE_OTHER): Payer: PPO | Admitting: Orthopedic Surgery

## 2017-04-14 DIAGNOSIS — S92324D Nondisplaced fracture of second metatarsal bone, right foot, subsequent encounter for fracture with routine healing: Secondary | ICD-10-CM

## 2017-04-14 NOTE — Progress Notes (Signed)
Office Visit Note   Patient: Heather Fitzgerald           Date of Birth: January 25, 1934           MRN: [id number] Visit Date: 04/14/2017              Requested by: Merrilee Seashore, [place] [address] [place], [place] [address] PCP: Merrilee Seashore, MD  Chief Complaint  Patient presents with  . Right Foot - Follow-up      HPI: Patient is a 83 year old woman who had a more formal SLAP fall her foot sustaining a closed fracture of the second metatarsal she's been in a postoperative shoe she is complains of pain along the medial border of her tibia.  Assessment & Plan: Visit Diagnoses:  1. Closed nondisplaced fracture of second metatarsal bone of right foot with routine healing, subsequent encounter     Plan: Recommended compression stockings recommended that she could try a stiff soled walking shoe or a trail running shoe instead of the postoperative shoe. Discussed the importance of the sole not being able to bend. Recommended vitamin D3 5000 international units a day. Repeat 3 view radiographs of the right foot at follow-up.  Follow-Up Instructions: Return in about 4 weeks (around 05/12/2017).   Ortho Exam  Patient is alert, oriented, no adenopathy, well-dressed, normal affect, normal respiratory effort. Examination patient has an antalgic gait she has good pulses she is tender to palpation over the forefoot over the second metatarsal. There is swelling she does have some numbness in her toes from the swelling. She is tender to palpation along the medial border of the tibia. The tibial crest is nontender to palpation. Symptoms are consistent with shin splints secondary to her limping.  Imaging: Xr Foot Complete Right  Result Date: 04/14/2017 2 view radiographs of the right foot shows a minimally displaced oblique fracture of the second metatarsal shaft. There is no varus or valgus malalignment.   No images are attached to the encounter.  Labs: No results  found for: HGBA1C, ESRSEDRATE, CRP, LABURIC, REPTSTATUS, GRAMSTAIN, CULT, LABORGA  Orders:  Orders Placed This Encounter  Procedures  . XR Foot Complete Right   No orders of the defined types were placed in this encounter.    Procedures: No procedures performed  Clinical Data: No additional findings.  ROS:  All other systems negative, except as noted in the HPI. Review of Systems  Objective: Vital Signs: There were no vitals taken for this visit.  Specialty Comments:  No specialty comments available.  PMFS History: Patient Active Problem List   Diagnosis Date Noted  . Postoperative anemia due to acute blood loss 03/29/2014  . S/P left hip conversion 03/27/2014  . Constipation 05/17/2013  . Acute blood loss anemia 05/17/2013  . Pain in left hip 05/17/2013  . Essential hypertension, benign 05/13/2013  . Thrombocytopenia, unspecified ([place]) 05/13/2013  . GERD (gastroesophageal reflux disease) 05/13/2013  . Intertrochanteric fracture of left hip ([place]) 05/12/2013   Past Medical History:  Diagnosis Date  . Anxiety   . Asthma    hx of   . GERD (gastroesophageal reflux disease)   . Headache(784.0)   . Hypertension   . Thrombocytopenia ([place])     History reviewed. No pertinent family history.  Past Surgical History:  Procedure Laterality Date  . ABDOMINAL HYSTERECTOMY    . CONVERSION TO TOTAL HIP Left 03/27/2014   Procedure: CONVERSION OF FAILED LEFT HIP HEMI TO TOTAL HIP ARTHROPLASTY;  Surgeon: Pietro Cassis  Alvan Dame, MD;  [facility];  Service: Orthopedics;  Laterality: Left;  . EYE SURGERY     bilateral cataract surgery   . GANGLION CYST EXCISION     right knee   . HIP ARTHROPLASTY Left 05/13/2013   Procedure: ARTHROPLASTY BIPOLAR HIP;  Surgeon: Augustin Schooling, MD;  Location: [place];  Service: Orthopedics;  Laterality: Left;  . knee cyst surgery     . left ankle surgery      due to fracture   . OVARIAN CYST SURGERY     Social History   Occupational History  .  Not on file.   Social History Main Topics  . Smoking status: Never Smoker  . Smokeless tobacco: Never Used  . Alcohol use No  . Drug use: No  . Sexual activity: Not on file

## 2017-04-15 DIAGNOSIS — H35363 Drusen (degenerative) of macula, bilateral: Secondary | ICD-10-CM | POA: Diagnosis not present

## 2017-04-15 DIAGNOSIS — H524 Presbyopia: Secondary | ICD-10-CM | POA: Diagnosis not present

## 2017-04-15 DIAGNOSIS — H47233 Glaucomatous optic atrophy, bilateral: Secondary | ICD-10-CM | POA: Diagnosis not present

## 2017-04-15 DIAGNOSIS — H04123 Dry eye syndrome of bilateral lacrimal glands: Secondary | ICD-10-CM | POA: Diagnosis not present

## 2017-04-15 DIAGNOSIS — H40013 Open angle with borderline findings, low risk, bilateral: Secondary | ICD-10-CM | POA: Diagnosis not present

## 2017-05-12 ENCOUNTER — Ambulatory Visit (INDEPENDENT_AMBULATORY_CARE_PROVIDER_SITE_OTHER): Payer: PPO | Admitting: Orthopedic Surgery

## 2017-05-12 ENCOUNTER — Ambulatory Visit (INDEPENDENT_AMBULATORY_CARE_PROVIDER_SITE_OTHER): Payer: PPO

## 2017-05-12 ENCOUNTER — Encounter (INDEPENDENT_AMBULATORY_CARE_PROVIDER_SITE_OTHER): Payer: Self-pay | Admitting: Orthopedic Surgery

## 2017-05-12 DIAGNOSIS — S92324D Nondisplaced fracture of second metatarsal bone, right foot, subsequent encounter for fracture with routine healing: Secondary | ICD-10-CM

## 2017-05-12 NOTE — Progress Notes (Signed)
Office Visit Note   Patient: Heather Fitzgerald           Date of Birth: Nov 05, 1933           MRN: [id number] Visit Date: 05/12/2017              Requested by: Merrilee Seashore, [place] [address] [place], [place] [address] PCP: Merrilee Seashore, MD  Chief Complaint  Patient presents with  . Right Foot - Fracture    Nondisplaced 2nd MT fracture      HPI: Patient is an 83 year old woman who presents 6 weeks status post nondisplaced fracture second metatarsal right foot she has been a postoperative shoe she is taken vitamin D3. Patient states that her pain is worse at the end of the day when she is off her foot. She is ambulating in a postoperative shoe.  Assessment & Plan: Visit Diagnoses:  1. Closed nondisplaced fracture of second metatarsal bone of right foot with routine healing, subsequent encounter     Plan: discussed that she could use a stiff soled shoe but patient states she does not have one. She will continue with the postoperative shoe for 3 weeks plan for follow-up at that time with 3 view radiographs of the right foot which time I believe we can advance her to regular shoewear.  Follow-Up Instructions: Return in about 3 weeks (around 06/02/2017).   Ortho Exam  Patient is alert, oriented, no adenopathy, well-dressed, normal affect, normal respiratory effort. Examination patient has an antalgic gait she has a good pulse she does have hallux valgus deformity with callus beneath the fifth metatarsal head right foot. She has minimal tenderness to palpation at the fracture site.  Imaging: Xr Foot Complete Right  Result Date: 05/12/2017 I reviewed radiographs of the right foot shows good callus formation around the second metatarsal stress fracture. Cascade of the forefoot is congruent. Patient has hallux valgus with subluxation of the great toe MTP joint with previous resection of the base of the fifth metatarsal proximal phalanx.  No images are  attached to the encounter.  Labs: No results found for: HGBA1C, ESRSEDRATE, CRP, LABURIC, REPTSTATUS, GRAMSTAIN, CULT, LABORGA  Orders:  Orders Placed This Encounter  Procedures  . XR Foot Complete Right   No orders of the defined types were placed in this encounter.    Procedures: No procedures performed  Clinical Data: No additional findings.  ROS:  All other systems negative, except as noted in the HPI. Review of Systems  Objective: Vital Signs: There were no vitals taken for this visit.  Specialty Comments:  No specialty comments available.  PMFS History: Patient Active Problem List   Diagnosis Date Noted  . Postoperative anemia due to acute blood loss 03/29/2014  . S/P left hip conversion 03/27/2014  . Constipation 05/17/2013  . Acute blood loss anemia 05/17/2013  . Pain in left hip 05/17/2013  . Essential hypertension, benign 05/13/2013  . Thrombocytopenia, unspecified ([place]) 05/13/2013  . GERD (gastroesophageal reflux disease) 05/13/2013  . Intertrochanteric fracture of left hip ([place]) 05/12/2013   Past Medical History:  Diagnosis Date  . Anxiety   . Asthma    hx of   . GERD (gastroesophageal reflux disease)   . Headache(784.0)   . Hypertension   . Thrombocytopenia ([place])     History reviewed. No pertinent family history.  Past Surgical History:  Procedure Laterality Date  . ABDOMINAL HYSTERECTOMY    . CONVERSION TO TOTAL HIP Left 03/27/2014   Procedure: CONVERSION  OF FAILED LEFT HIP HEMI TO TOTAL HIP ARTHROPLASTY;  Surgeon: Mauri Pole, MD;  [facility];  Service: Orthopedics;  Laterality: Left;  . EYE SURGERY     bilateral cataract surgery   . GANGLION CYST EXCISION     right knee   . HIP ARTHROPLASTY Left 05/13/2013   Procedure: ARTHROPLASTY BIPOLAR HIP;  Surgeon: Augustin Schooling, MD;  Location: [place];  Service: Orthopedics;  Laterality: Left;  . knee cyst surgery     . left ankle surgery      due to fracture   . OVARIAN CYST SURGERY       Social History   Occupational History  . Not on file.   Social History Main Topics  . Smoking status: Never Smoker  . Smokeless tobacco: Never Used  . Alcohol use No  . Drug use: No  . Sexual activity: Not on file

## 2017-05-22 DIAGNOSIS — N939 Abnormal uterine and vaginal bleeding, unspecified: Secondary | ICD-10-CM | POA: Diagnosis not present

## 2017-05-22 DIAGNOSIS — N390 Urinary tract infection, site not specified: Secondary | ICD-10-CM | POA: Diagnosis not present

## 2017-05-22 DIAGNOSIS — N952 Postmenopausal atrophic vaginitis: Secondary | ICD-10-CM | POA: Diagnosis not present

## 2017-06-02 ENCOUNTER — Ambulatory Visit (INDEPENDENT_AMBULATORY_CARE_PROVIDER_SITE_OTHER): Payer: PPO

## 2017-06-02 ENCOUNTER — Ambulatory Visit (INDEPENDENT_AMBULATORY_CARE_PROVIDER_SITE_OTHER): Payer: PPO | Admitting: Orthopedic Surgery

## 2017-06-02 ENCOUNTER — Encounter (INDEPENDENT_AMBULATORY_CARE_PROVIDER_SITE_OTHER): Payer: Self-pay | Admitting: Orthopedic Surgery

## 2017-06-02 VITALS — Ht 62.0 in | Wt 126.0 lb

## 2017-06-02 DIAGNOSIS — S92324G Nondisplaced fracture of second metatarsal bone, right foot, subsequent encounter for fracture with delayed healing: Secondary | ICD-10-CM

## 2017-06-02 NOTE — Progress Notes (Signed)
Office Visit Note   Patient: Heather Fitzgerald           Date of Birth: March 02, 1934           MRN: [id number] Visit Date: 06/02/2017              Requested by: Merrilee Seashore, [address], [place] [address] PCP: Merrilee Seashore, MD  Chief Complaint  Patient presents with  . Right Foot - Follow-up      HPI: Is an 83 year old woman who presents about 2 months status post stress fracture second metatarsal shaft right foot.  Patient states that the evening time she has lateral ankle pain.  She has been using vitamin D3 daily states she still has some swelling in the toes and makes it difficult to wiggle her toes.  Assessment & Plan: Visit Diagnoses:  1. Closed nondisplaced fracture of second metatarsal bone of right foot with delayed healing, subsequent encounter     Plan: Recommend that she advance to regular shoewear and resume her normal gait.  She has no restrictions.  Follow-Up Instructions: Return if symptoms worsen or fail to improve.   Ortho Exam  Patient is alert, oriented, no adenopathy, well-dressed, normal affect, normal respiratory effort. Examination patient does roll her foot to the side to unload the second metatarsal in her postoperative shoe this is placing a varus stress on the lateral ankle ligaments.  Her foot is plantigrade there are no calluses no ulcers there is no tenderness to palpation.  Imaging: Xr Foot Complete Right  Result Date: 06/02/2017 3 view radiographs of the right foot shows excellent callus formation around the second metatarsal stress fracture.  Patient has subluxation of the MTP joint of the right great toe secondary to bunion deformity status post proximal phalanx resection base of the proximal phalanx little toe.  No images are attached to the encounter.  Labs: No results found for: HGBA1C, ESRSEDRATE, CRP, LABURIC, REPTSTATUS, GRAMSTAIN, CULT, LABORGA  Orders:  Orders Placed This Encounter    Procedures  . XR Foot Complete Right   No orders of the defined types were placed in this encounter.    Procedures: No procedures performed  Clinical Data: No additional findings.  ROS:  All other systems negative, except as noted in the HPI. Review of Systems  Objective: Vital Signs: Ht 5\' 2"  (1.575 m)   Wt 126 lb (57.2 kg)   BMI 23.05 kg/m   Specialty Comments:  No specialty comments available.  PMFS History: Patient Active Problem List   Diagnosis Date Noted  . Postoperative anemia due to acute blood loss 03/29/2014  . S/P left hip conversion 03/27/2014  . Constipation 05/17/2013  . Acute blood loss anemia 05/17/2013  . Pain in left hip 05/17/2013  . Essential hypertension, benign 05/13/2013  . Thrombocytopenia, unspecified ([place]) 05/13/2013  . GERD (gastroesophageal reflux disease) 05/13/2013  . Intertrochanteric fracture of left hip ([place]) 05/12/2013   Past Medical History:  Diagnosis Date  . Anxiety   . Asthma    hx of   . GERD (gastroesophageal reflux disease)   . Headache(784.0)   . Hypertension   . Thrombocytopenia ([place])     History reviewed. No pertinent family history.  Past Surgical History:  Procedure Laterality Date  . ABDOMINAL HYSTERECTOMY    . CONVERSION TO TOTAL HIP Left 03/27/2014   Procedure: CONVERSION OF FAILED LEFT HIP HEMI TO TOTAL HIP ARTHROPLASTY;  Surgeon: Mauri Pole, MD;  Location: [facility];  Service: Orthopedics;  Laterality: Left;  . EYE SURGERY     bilateral cataract surgery   . GANGLION CYST EXCISION     right knee   . HIP ARTHROPLASTY Left 05/13/2013   Procedure: ARTHROPLASTY BIPOLAR HIP;  Surgeon: Augustin Schooling, MD;  Location: [facility];  Service: Orthopedics;  Laterality: Left;  . knee cyst surgery     . left ankle surgery      due to fracture   . OVARIAN CYST SURGERY     Social History   Occupational History  . Not on file.   Social History Main Topics  . Smoking status: Never Smoker  . Smokeless tobacco: Never  Used  . Alcohol use No  . Drug use: No  . Sexual activity: Not on file

## 2017-06-04 DIAGNOSIS — Z0000 Encounter for general adult medical examination without abnormal findings: Secondary | ICD-10-CM | POA: Diagnosis not present

## 2017-06-04 DIAGNOSIS — N390 Urinary tract infection, site not specified: Secondary | ICD-10-CM | POA: Diagnosis not present

## 2017-06-04 DIAGNOSIS — E782 Mixed hyperlipidemia: Secondary | ICD-10-CM | POA: Diagnosis not present

## 2017-06-04 DIAGNOSIS — D696 Thrombocytopenia, unspecified: Secondary | ICD-10-CM | POA: Diagnosis not present

## 2017-06-04 DIAGNOSIS — Z23 Encounter for immunization: Secondary | ICD-10-CM | POA: Diagnosis not present

## 2017-06-11 DIAGNOSIS — K589 Irritable bowel syndrome without diarrhea: Secondary | ICD-10-CM | POA: Diagnosis not present

## 2017-06-11 DIAGNOSIS — I8312 Varicose veins of left lower extremity with inflammation: Secondary | ICD-10-CM | POA: Diagnosis not present

## 2017-06-11 DIAGNOSIS — M150 Primary generalized (osteo)arthritis: Secondary | ICD-10-CM | POA: Diagnosis not present

## 2017-06-11 DIAGNOSIS — F419 Anxiety disorder, unspecified: Secondary | ICD-10-CM | POA: Diagnosis not present

## 2017-06-11 DIAGNOSIS — I10 Essential (primary) hypertension: Secondary | ICD-10-CM | POA: Diagnosis not present

## 2017-06-11 DIAGNOSIS — D692 Other nonthrombocytopenic purpura: Secondary | ICD-10-CM | POA: Diagnosis not present

## 2017-06-11 DIAGNOSIS — E782 Mixed hyperlipidemia: Secondary | ICD-10-CM | POA: Diagnosis not present

## 2017-06-11 DIAGNOSIS — D696 Thrombocytopenia, unspecified: Secondary | ICD-10-CM | POA: Diagnosis not present

## 2017-06-29 DIAGNOSIS — N939 Abnormal uterine and vaginal bleeding, unspecified: Secondary | ICD-10-CM | POA: Diagnosis not present

## 2017-06-29 DIAGNOSIS — N952 Postmenopausal atrophic vaginitis: Secondary | ICD-10-CM | POA: Diagnosis not present

## 2017-07-14 ENCOUNTER — Ambulatory Visit (INDEPENDENT_AMBULATORY_CARE_PROVIDER_SITE_OTHER): Payer: PPO | Admitting: Orthopedic Surgery

## 2017-07-22 ENCOUNTER — Ambulatory Visit (INDEPENDENT_AMBULATORY_CARE_PROVIDER_SITE_OTHER): Payer: PPO | Admitting: Orthopedic Surgery

## 2017-07-28 ENCOUNTER — Ambulatory Visit (INDEPENDENT_AMBULATORY_CARE_PROVIDER_SITE_OTHER): Payer: PPO

## 2017-07-28 ENCOUNTER — Encounter (INDEPENDENT_AMBULATORY_CARE_PROVIDER_SITE_OTHER): Payer: Self-pay | Admitting: Orthopedic Surgery

## 2017-07-28 ENCOUNTER — Ambulatory Visit (INDEPENDENT_AMBULATORY_CARE_PROVIDER_SITE_OTHER): Payer: PPO | Admitting: Orthopedic Surgery

## 2017-07-28 VITALS — Ht 62.0 in | Wt 126.0 lb

## 2017-07-28 DIAGNOSIS — M79671 Pain in right foot: Secondary | ICD-10-CM

## 2017-07-28 DIAGNOSIS — S92324G Nondisplaced fracture of second metatarsal bone, right foot, subsequent encounter for fracture with delayed healing: Secondary | ICD-10-CM

## 2017-07-28 NOTE — Progress Notes (Signed)
Office Visit Note   Patient: Heather Fitzgerald           Date of Birth: January 25, 1934           MRN: [id number] Visit Date: 07/28/2017              Requested by: Merrilee Seashore, [address], [place] [address] PCP: Merrilee Seashore, MD  Chief Complaint  Patient presents with  . Right Foot - Follow-up    4 months s/p a stress fx 2nd MT shaft      HPI: Patient presents in follow-up for delayed union stress fracture second metatarsal right foot.  Patient states she still has some pain and swelling she is currently in soft flexible sandals.  She states she has shooting pain in the top of her foot after prolonged activities.  Assessment & Plan: Visit Diagnoses:  1. Pain in right foot   2. Closed nondisplaced fracture of second metatarsal bone of right foot with delayed healing, subsequent encounter     Plan: Recommended a stiffer soled shoe to unload pressure across the metatarsal heads.  Discussed that her pain is most likely due to the swelling and the callus formation at the fracture site putting pressure on the nerves.  This should resolve with time.  Follow-Up Instructions: Return if symptoms worsen or fail to improve.   Ortho Exam  Patient is alert, oriented, no adenopathy, well-dressed, normal affect, normal respiratory effort. Examination patient's foot is plantigrade she has no redness no cellulitis no ecchymosis no bruising.  She has a good dorsalis pedis pulse.  She has overlapping of her toes but no ulcers.  There is a little bit of pitting edema.  Imaging: Xr Foot Complete Right  Result Date: 07/28/2017 3 view radiographs of the right foot shows excellent callus formation through the stress fracture of the second metatarsal.  There is overlapping of the toes.  No images are attached to the encounter.  Labs: No results found for: HGBA1C, ESRSEDRATE, CRP, LABURIC, REPTSTATUS, GRAMSTAIN, CULT,  LABORGA  @LABSALLVALUES (HGBA1)@  Body mass index is 23.05 kg/m.  Orders:  Orders Placed This Encounter  Procedures  . XR Foot Complete Right   No orders of the defined types were placed in this encounter.    Procedures: No procedures performed  Clinical Data: No additional findings.  ROS:  All other systems negative, except as noted in the HPI. Review of Systems  Objective: Vital Signs: Ht 5\' 2"  (1.575 m)   Wt 126 lb (57.2 kg)   BMI 23.05 kg/m   Specialty Comments:  No specialty comments available.  PMFS History: Patient Active Problem List   Diagnosis Date Noted  . Postoperative anemia due to acute blood loss 03/29/2014  . S/P left hip conversion 03/27/2014  . Constipation 05/17/2013  . Acute blood loss anemia 05/17/2013  . Pain in left hip 05/17/2013  . Essential hypertension, benign 05/13/2013  . Thrombocytopenia, unspecified ([facility] [facility]) 05/13/2013  . GERD (gastroesophageal reflux disease) 05/13/2013  . Intertrochanteric fracture of left hip ([place]) 05/12/2013   Past Medical History:  Diagnosis Date  . Anxiety   . Asthma    hx of   . GERD (gastroesophageal reflux disease)   . Headache(784.0)   . Hypertension   . Thrombocytopenia ([place])     History reviewed. No pertinent family history.  Past Surgical History:  Procedure Laterality Date  . ABDOMINAL HYSTERECTOMY    . CONVERSION TO TOTAL HIP Left 03/27/2014   Procedure: CONVERSION  OF FAILED LEFT HIP HEMI TO TOTAL HIP ARTHROPLASTY;  Surgeon: Mauri Pole, MD;  [facility];  Service: Orthopedics;  Laterality: Left;  . EYE SURGERY     bilateral cataract surgery   . GANGLION CYST EXCISION     right knee   . HIP ARTHROPLASTY Left 05/13/2013   Procedure: ARTHROPLASTY BIPOLAR HIP;  Surgeon: Augustin Schooling, MD;  Location: [facility];  Service: Orthopedics;  Laterality: Left;  . knee cyst surgery     . left ankle surgery      due to fracture   . OVARIAN CYST SURGERY     Social History   Occupational  History  . Not on file  Tobacco Use  . Smoking status: Never Smoker  . Smokeless tobacco: Never Used  Substance and Sexual Activity  . Alcohol use: No  . Drug use: No  . Sexual activity: Not on file

## 2017-10-14 DIAGNOSIS — H40013 Open angle with borderline findings, low risk, bilateral: Secondary | ICD-10-CM | POA: Diagnosis not present

## 2017-10-20 DIAGNOSIS — G44209 Tension-type headache, unspecified, not intractable: Secondary | ICD-10-CM | POA: Diagnosis not present

## 2017-10-20 DIAGNOSIS — F329 Major depressive disorder, single episode, unspecified: Secondary | ICD-10-CM | POA: Diagnosis not present

## 2017-10-20 DIAGNOSIS — R072 Precordial pain: Secondary | ICD-10-CM | POA: Diagnosis not present

## 2017-11-19 DIAGNOSIS — Z1231 Encounter for screening mammogram for malignant neoplasm of breast: Secondary | ICD-10-CM | POA: Diagnosis not present

## 2017-11-19 DIAGNOSIS — Z01419 Encounter for gynecological examination (general) (routine) without abnormal findings: Secondary | ICD-10-CM | POA: Diagnosis not present

## 2017-11-19 DIAGNOSIS — N952 Postmenopausal atrophic vaginitis: Secondary | ICD-10-CM | POA: Diagnosis not present

## 2017-11-19 DIAGNOSIS — D696 Thrombocytopenia, unspecified: Secondary | ICD-10-CM | POA: Diagnosis not present

## 2017-11-19 DIAGNOSIS — E782 Mixed hyperlipidemia: Secondary | ICD-10-CM | POA: Diagnosis not present

## 2017-11-19 DIAGNOSIS — Z6821 Body mass index (BMI) 21.0-21.9, adult: Secondary | ICD-10-CM | POA: Diagnosis not present

## 2017-11-19 DIAGNOSIS — I10 Essential (primary) hypertension: Secondary | ICD-10-CM | POA: Diagnosis not present

## 2017-11-26 DIAGNOSIS — M150 Primary generalized (osteo)arthritis: Secondary | ICD-10-CM | POA: Diagnosis not present

## 2017-11-26 DIAGNOSIS — I10 Essential (primary) hypertension: Secondary | ICD-10-CM | POA: Diagnosis not present

## 2017-11-26 DIAGNOSIS — D696 Thrombocytopenia, unspecified: Secondary | ICD-10-CM | POA: Diagnosis not present

## 2017-11-26 DIAGNOSIS — E782 Mixed hyperlipidemia: Secondary | ICD-10-CM | POA: Diagnosis not present

## 2017-11-26 DIAGNOSIS — K589 Irritable bowel syndrome without diarrhea: Secondary | ICD-10-CM | POA: Diagnosis not present

## 2018-01-21 DIAGNOSIS — M71349 Other bursal cyst, unspecified hand: Secondary | ICD-10-CM | POA: Diagnosis not present

## 2018-01-21 DIAGNOSIS — M67449 Ganglion, unspecified hand: Secondary | ICD-10-CM | POA: Insufficient documentation

## 2018-01-21 DIAGNOSIS — M79645 Pain in left finger(s): Secondary | ICD-10-CM | POA: Diagnosis not present

## 2018-02-15 DIAGNOSIS — M71349 Other bursal cyst, unspecified hand: Secondary | ICD-10-CM | POA: Diagnosis not present

## 2018-02-15 DIAGNOSIS — M71342 Other bursal cyst, left hand: Secondary | ICD-10-CM | POA: Diagnosis not present

## 2018-04-28 DIAGNOSIS — H40013 Open angle with borderline findings, low risk, bilateral: Secondary | ICD-10-CM | POA: Diagnosis not present

## 2018-04-28 DIAGNOSIS — H52203 Unspecified astigmatism, bilateral: Secondary | ICD-10-CM | POA: Diagnosis not present

## 2018-04-28 DIAGNOSIS — H35363 Drusen (degenerative) of macula, bilateral: Secondary | ICD-10-CM | POA: Diagnosis not present

## 2018-04-28 DIAGNOSIS — H524 Presbyopia: Secondary | ICD-10-CM | POA: Diagnosis not present

## 2018-04-28 DIAGNOSIS — H04123 Dry eye syndrome of bilateral lacrimal glands: Secondary | ICD-10-CM | POA: Diagnosis not present

## 2018-04-28 DIAGNOSIS — Z961 Presence of intraocular lens: Secondary | ICD-10-CM | POA: Diagnosis not present

## 2018-04-28 DIAGNOSIS — Z83511 Family history of glaucoma: Secondary | ICD-10-CM | POA: Diagnosis not present

## 2018-04-28 DIAGNOSIS — H5202 Hypermetropia, left eye: Secondary | ICD-10-CM | POA: Diagnosis not present

## 2018-06-16 DIAGNOSIS — Z780 Asymptomatic menopausal state: Secondary | ICD-10-CM | POA: Diagnosis not present

## 2018-06-16 DIAGNOSIS — I10 Essential (primary) hypertension: Secondary | ICD-10-CM | POA: Diagnosis not present

## 2018-06-16 DIAGNOSIS — Z0000 Encounter for general adult medical examination without abnormal findings: Secondary | ICD-10-CM | POA: Diagnosis not present

## 2018-06-16 DIAGNOSIS — E782 Mixed hyperlipidemia: Secondary | ICD-10-CM | POA: Diagnosis not present

## 2018-06-16 DIAGNOSIS — M8580 Other specified disorders of bone density and structure, unspecified site: Secondary | ICD-10-CM | POA: Diagnosis not present

## 2018-06-16 DIAGNOSIS — D696 Thrombocytopenia, unspecified: Secondary | ICD-10-CM | POA: Diagnosis not present

## 2018-06-23 DIAGNOSIS — M150 Primary generalized (osteo)arthritis: Secondary | ICD-10-CM | POA: Diagnosis not present

## 2018-06-23 DIAGNOSIS — D696 Thrombocytopenia, unspecified: Secondary | ICD-10-CM | POA: Diagnosis not present

## 2018-06-23 DIAGNOSIS — R21 Rash and other nonspecific skin eruption: Secondary | ICD-10-CM | POA: Diagnosis not present

## 2018-06-23 DIAGNOSIS — D692 Other nonthrombocytopenic purpura: Secondary | ICD-10-CM | POA: Diagnosis not present

## 2018-06-23 DIAGNOSIS — I8312 Varicose veins of left lower extremity with inflammation: Secondary | ICD-10-CM | POA: Diagnosis not present

## 2018-06-23 DIAGNOSIS — I10 Essential (primary) hypertension: Secondary | ICD-10-CM | POA: Diagnosis not present

## 2018-06-23 DIAGNOSIS — K589 Irritable bowel syndrome without diarrhea: Secondary | ICD-10-CM | POA: Diagnosis not present

## 2018-06-23 DIAGNOSIS — N3281 Overactive bladder: Secondary | ICD-10-CM | POA: Diagnosis not present

## 2018-06-23 DIAGNOSIS — E782 Mixed hyperlipidemia: Secondary | ICD-10-CM | POA: Diagnosis not present

## 2018-06-23 DIAGNOSIS — M21961 Unspecified acquired deformity of right lower leg: Secondary | ICD-10-CM | POA: Diagnosis not present

## 2018-06-23 DIAGNOSIS — Z0000 Encounter for general adult medical examination without abnormal findings: Secondary | ICD-10-CM | POA: Diagnosis not present

## 2018-06-28 ENCOUNTER — Encounter: Payer: Self-pay | Admitting: Podiatry

## 2018-06-28 ENCOUNTER — Ambulatory Visit: Payer: PPO | Admitting: Podiatry

## 2018-06-28 ENCOUNTER — Ambulatory Visit (INDEPENDENT_AMBULATORY_CARE_PROVIDER_SITE_OTHER): Payer: PPO

## 2018-06-28 DIAGNOSIS — M2012 Hallux valgus (acquired), left foot: Secondary | ICD-10-CM | POA: Diagnosis not present

## 2018-06-28 DIAGNOSIS — M2011 Hallux valgus (acquired), right foot: Secondary | ICD-10-CM

## 2018-06-28 DIAGNOSIS — M205X9 Other deformities of toe(s) (acquired), unspecified foot: Secondary | ICD-10-CM

## 2018-06-28 DIAGNOSIS — M2061 Acquired deformities of toe(s), unspecified, right foot: Secondary | ICD-10-CM

## 2018-06-28 NOTE — Progress Notes (Signed)
Subjective:    Patient ID: Heather Fitzgerald, female    DOB: 15-Dec-1933, 84 y.o.   MRN: [id number]  HPI 84 year old female presents the office today for concerns of bunions as well as multiple digital deformities of the right side worse than left.  She does relate that she broke her right foot last year after a marble cutting board dropped on her foot.  She states that since then her toes have all been overlapping and cause discomfort.  She states that she has difficulty wearing shoes because of the bunion as well as the overlapping toes.  She is tried some offloading pads but it causes pain when I put stress on the toes.  She has no other concerns today.   Review of Systems  All other systems reviewed and are negative.  Past Medical History:  Diagnosis Date  . Anxiety   . Asthma    hx of   . GERD (gastroesophageal reflux disease)   . Headache(784.0)   . Hypertension   . Thrombocytopenia ([place])     Past Surgical History:  Procedure Laterality Date  . ABDOMINAL HYSTERECTOMY    . CONVERSION TO TOTAL HIP Left 03/27/2014   Procedure: CONVERSION OF FAILED LEFT HIP HEMI TO TOTAL HIP ARTHROPLASTY;  Surgeon: Mauri Pole, MD;  [facility];  Service: Orthopedics;  Laterality: Left;  . EYE SURGERY     bilateral cataract surgery   . GANGLION CYST EXCISION     right knee   . HIP ARTHROPLASTY Left 05/13/2013   Procedure: ARTHROPLASTY BIPOLAR HIP;  Surgeon: Augustin Schooling, MD;  Location: [facility];  Service: Orthopedics;  Laterality: Left;  . knee cyst surgery     . left ankle surgery      due to fracture   . OVARIAN CYST SURGERY       Current Outpatient Medications:  .  acetaminophen (TYLENOL) 650 MG CR tablet, Take 650 mg by mouth every 6 (six) hours as needed for pain or fever., Disp: , Rfl:  .  ALPRAZolam (XANAX) 0.25 MG tablet, Take 0.25 mg by mouth 3 (three) times daily as needed for anxiety., Disp: , Rfl:  .  amLODipine (NORVASC) 2.5 MG tablet, Take 2.5 mg by mouth daily  with breakfast. , Disp: , Rfl:  .  clonazePAM (KLONOPIN) 0.5 MG tablet, Take 0.5 mg by mouth., Disp: , Rfl:  .  cyclobenzaprine (FLEXERIL) 10 MG tablet, Take 1 tablet (10 mg total) by mouth 3 (three) times daily as needed for muscle spasms., Disp: 30 tablet, Rfl: 0 .  docusate sodium 100 MG CAPS, Take 100 mg by mouth 2 (two) times daily., Disp: 10 capsule, Rfl: 0 .  ferrous sulfate 325 (65 FE) MG tablet, Take 325 mg by mouth 2 (two) times daily with a meal., Disp: , Rfl:  .  Magnesium 250 MG TABS, Take 250 mg by mouth daily., Disp: , Rfl:  .  omeprazole (PRILOSEC) 20 MG capsule, Take 20 mg by mouth daily as needed (acid reflux)., Disp: , Rfl:  .  polyethylene glycol (MIRALAX / GLYCOLAX) packet, Take 17 g by mouth 2 (two) times daily., Disp: 14 each, Rfl: 0  Allergies  Allergen Reactions  . Iohexol      Code: HIVES, Desc: (+) SKIN TEST 34YRS AGO, OK W/ 1VP 15 YRS AGO, TODAY W/ HIVES, 50MG  BENADRYL PO.Marland KitchenPT OK, SUGGESTED 13 PRE MEDS FOR FUTURE IV CONTRAST PER DR.MATTERN//A.C.PT CALLS [place] 2 DAYS POST INJ, SHE HAS BEEN NAUSEATED SINCE EXAM &  HAS STARTED TO ITCH AGAIN,,, WE RE, Onset Date: [id number]   . Sulfa Antibiotics Nausea Only  . Losartan Rash        Objective:   Physical Exam  General: AAO x3, NAD  Dermatological: Skin is warm, dry and supple bilateral. Nails x 10 are well manicured; remaining integument appears unremarkable at this time. There are no open sores, no preulcerative lesions, no rash or signs of infection present.  Vascular: Dorsalis Pedis artery and Posterior Tibial artery pedal pulses are 2/4 bilateral with immedate capillary fill time. Pedal hair growth present. No varicosities and no lower extremity edema present bilateral. There is no pain with calf compression, swelling, warmth, erythema.   Neruologic: Grossly intact via light touch bilateral.Protective threshold with Semmes Wienstein monofilament intact to all pedal sites bilateral.   Musculoskeletal: Severe bunion  deformities present with subluxation of the first MPJ.  Multiple deformities are present with overlapping second and third toes.  Tenderness on the bunion site as well as the toes with overlapping on the second and third toes of the right side worse than left.  Muscular strength 5/5 in all groups tested bilateral.  Gait: Unassisted, Nonantalgic.        Assessment & Plan:  84 year old female severe bunion deformity with significant overlapping toes -Treatment options discussed including all alternatives, risks, and complications -Etiology of symptoms were discussed -X-rays were obtained and reviewed with the patient.  Severe bunion deformities present with subluxation of the first MPJ and overlapping toes.  Also evidence of prior surgery with dislocation of the fifth toe present.  No definitive evidence of acute fracture but subacute to chronic fracture present of the left second metatarsal on the right.  -We long discussion regards to treatment options.  We discussed both conservative as well as surgical options.  In regards to conservative treatment we discussed wearing extra-depth shoe to help accommodate the area as well as various offloading pads.  We discussed surgical intervention.  We discussed various surgeries.  At this point I think I would do a Keller bunionectomy vs 1st MTPJ arthrodesis with repair of the second and third toes with K wire fixation to help stabilize the toe.  We discussed the procedure as well as postoperative course.  Given the length the postoperative course is and consider options.  At this time there is no area of skin breakdown we need to monitor this closely dispensed various pads for her.  Trula Slade DPM

## 2018-06-30 ENCOUNTER — Other Ambulatory Visit: Payer: Self-pay | Admitting: Podiatry

## 2018-06-30 DIAGNOSIS — M2012 Hallux valgus (acquired), left foot: Principal | ICD-10-CM

## 2018-06-30 DIAGNOSIS — M2011 Hallux valgus (acquired), right foot: Secondary | ICD-10-CM

## 2018-07-26 ENCOUNTER — Encounter (INDEPENDENT_AMBULATORY_CARE_PROVIDER_SITE_OTHER): Payer: Self-pay | Admitting: Orthopedic Surgery

## 2018-07-26 ENCOUNTER — Ambulatory Visit (INDEPENDENT_AMBULATORY_CARE_PROVIDER_SITE_OTHER): Payer: PPO | Admitting: Orthopedic Surgery

## 2018-07-26 VITALS — Ht 62.0 in | Wt 126.0 lb

## 2018-07-26 DIAGNOSIS — M21611 Bunion of right foot: Secondary | ICD-10-CM | POA: Diagnosis not present

## 2018-07-26 DIAGNOSIS — M205X1 Other deformities of toe(s) (acquired), right foot: Secondary | ICD-10-CM

## 2018-07-26 DIAGNOSIS — M79671 Pain in right foot: Secondary | ICD-10-CM

## 2018-07-26 NOTE — Progress Notes (Signed)
Office Visit Note   Patient: Heather Fitzgerald           Date of Birth: Mar 26, 1934           MRN: [id number] Visit Date: 07/26/2018              Requested by: Merrilee Seashore, [address], [place] [address] PCP: Merrilee Seashore, MD  Chief Complaint  Patient presents with  . Right Foot - Pain      HPI: Patient is a 84 year old woman who presents complaining of overlapping of the toes on the right foot.  Patient states that she was referred by her primary care physician to podiatry for evaluation for straightening of her toes.  Patient states that Dr. Earleen Newport has recommended corrective surgery.  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  She states that she does have pain but has not had any ulcers.    Assessment & Plan: Visit Diagnoses:  1. Pain in right foot   2. Bunion of great toe of right foot   3. Claw toe, acquired, right     Plan: Discussed that reconstruction for the right foot would be quite extensive.  She would need a opening wedge osteotomy of the first metatarsal fusion of the MTP joint with distal osteotomy.  She would need Weil osteotomies for the second third and fourth metatarsals as well as PIP resections of these lesser toes.  Patient states she does not have time to proceed with surgery.  We will follow-up as needed.  Follow-Up Instructions: Return if symptoms worsen or fail to improve.   Ortho Exam  Patient is alert, oriented, no adenopathy, well-dressed, normal affect, normal respiratory effort. Examination patient has a good dorsalis pedis and posterior tibial pulse.  She has overlapping of the great toe over the third toe and second toe overlapping the third toe as well.  There are no ulcers.  Radiographs shows a old healed fracture of a stress fracture of the second metatarsal with a long second third and fourth metatarsal with complete  dislocation of the MTP joint of the great toe.  There is no redness no calluses no signs of any impending skin breakdown.  Imaging: No results found. No images are attached to the encounter.  Labs: No results found for: HGBA1C, ESRSEDRATE, CRP, LABURIC, REPTSTATUS, GRAMSTAIN, CULT, LABORGA   No results found for: ALBUMIN, PREALBUMIN, LABURIC  Body mass index is 23.05 kg/m.  Orders:  No orders of the defined types were placed in this encounter.  No orders of the defined types were placed in this encounter.    Procedures: No procedures performed  Clinical Data: No additional findings.  ROS:  All other systems negative, except as noted in the HPI. Review of Systems  Objective: Vital Signs: Ht 5\' 2"  (1.575 m)   Wt 126 lb (57.2 kg)   BMI 23.05 kg/m   Specialty Comments:  No specialty comments available.  PMFS History: Patient Active Problem List   Diagnosis Date Noted  . Digital mucous cyst 01/21/2018  . Postoperative anemia due to acute blood loss 03/29/2014  . S/P left hip conversion 03/27/2014  . Constipation 05/17/2013  . Acute blood loss anemia 05/17/2013  . Pain in left hip 05/17/2013  . Essential hypertension, benign 05/13/2013  . Thrombocytopenia, unspecified ([facility]) 05/13/2013  . GERD (gastroesophageal reflux disease) 05/13/2013  . Intertrochanteric fracture of left hip (  [facility]) 05/12/2013   Past Medical History:  Diagnosis Date  . Anxiety   . Asthma    hx of   . GERD (gastroesophageal reflux disease)   . Headache(784.0)   . Hypertension   . Thrombocytopenia ([facility])     History reviewed. No pertinent family history.  Past Surgical History:  Procedure Laterality Date  . ABDOMINAL HYSTERECTOMY    . CONVERSION TO TOTAL HIP Left 03/27/2014   Procedure: CONVERSION OF FAILED LEFT HIP HEMI TO TOTAL HIP ARTHROPLASTY;  Surgeon: Mauri Pole, MD;  [facility];  Service: Orthopedics;  Laterality: Left;  . EYE SURGERY     bilateral cataract surgery   .  GANGLION CYST EXCISION     right knee   . HIP ARTHROPLASTY Left 05/13/2013   Procedure: ARTHROPLASTY BIPOLAR HIP;  Surgeon: Augustin Schooling, MD;  Location: [facility];  Service: Orthopedics;  Laterality: Left;  . knee cyst surgery     . left ankle surgery      due to fracture   . OVARIAN CYST SURGERY     Social History   Occupational History  . Not on file  Tobacco Use  . Smoking status: Never Smoker  . Smokeless tobacco: Never Used  Substance and Sexual Activity  . Alcohol use: No  . Drug use: No  . Sexual activity: Not on file

## 2018-09-27 DIAGNOSIS — R071 Chest pain on breathing: Secondary | ICD-10-CM | POA: Diagnosis not present

## 2018-09-27 DIAGNOSIS — R05 Cough: Secondary | ICD-10-CM | POA: Diagnosis not present

## 2018-09-27 DIAGNOSIS — J22 Unspecified acute lower respiratory infection: Secondary | ICD-10-CM | POA: Diagnosis not present

## 2018-09-27 DIAGNOSIS — J029 Acute pharyngitis, unspecified: Secondary | ICD-10-CM | POA: Diagnosis not present

## 2018-12-13 DIAGNOSIS — Z01419 Encounter for gynecological examination (general) (routine) without abnormal findings: Secondary | ICD-10-CM | POA: Diagnosis not present

## 2018-12-13 DIAGNOSIS — Z6821 Body mass index (BMI) 21.0-21.9, adult: Secondary | ICD-10-CM | POA: Diagnosis not present

## 2018-12-13 DIAGNOSIS — N951 Menopausal and female climacteric states: Secondary | ICD-10-CM | POA: Diagnosis not present

## 2018-12-13 DIAGNOSIS — N952 Postmenopausal atrophic vaginitis: Secondary | ICD-10-CM | POA: Diagnosis not present

## 2018-12-16 DIAGNOSIS — I10 Essential (primary) hypertension: Secondary | ICD-10-CM | POA: Diagnosis not present

## 2018-12-23 DIAGNOSIS — D696 Thrombocytopenia, unspecified: Secondary | ICD-10-CM | POA: Diagnosis not present

## 2018-12-23 DIAGNOSIS — I10 Essential (primary) hypertension: Secondary | ICD-10-CM | POA: Diagnosis not present

## 2018-12-23 DIAGNOSIS — Z23 Encounter for immunization: Secondary | ICD-10-CM | POA: Diagnosis not present

## 2018-12-23 DIAGNOSIS — I8312 Varicose veins of left lower extremity with inflammation: Secondary | ICD-10-CM | POA: Diagnosis not present

## 2018-12-23 DIAGNOSIS — E782 Mixed hyperlipidemia: Secondary | ICD-10-CM | POA: Diagnosis not present

## 2019-01-12 DIAGNOSIS — H40013 Open angle with borderline findings, low risk, bilateral: Secondary | ICD-10-CM | POA: Diagnosis not present

## 2019-01-12 DIAGNOSIS — Z83511 Family history of glaucoma: Secondary | ICD-10-CM | POA: Diagnosis not present

## 2019-01-12 DIAGNOSIS — H04123 Dry eye syndrome of bilateral lacrimal glands: Secondary | ICD-10-CM | POA: Diagnosis not present

## 2019-01-12 DIAGNOSIS — H02831 Dermatochalasis of right upper eyelid: Secondary | ICD-10-CM | POA: Diagnosis not present

## 2019-01-12 DIAGNOSIS — H02834 Dermatochalasis of left upper eyelid: Secondary | ICD-10-CM | POA: Diagnosis not present

## 2019-02-25 DIAGNOSIS — M25562 Pain in left knee: Secondary | ICD-10-CM | POA: Diagnosis not present

## 2019-02-25 DIAGNOSIS — M25552 Pain in left hip: Secondary | ICD-10-CM | POA: Diagnosis not present

## 2019-03-10 ENCOUNTER — Other Ambulatory Visit: Payer: Self-pay

## 2019-04-04 DIAGNOSIS — Z23 Encounter for immunization: Secondary | ICD-10-CM | POA: Diagnosis not present

## 2019-04-08 DIAGNOSIS — Z96642 Presence of left artificial hip joint: Secondary | ICD-10-CM | POA: Diagnosis not present

## 2019-04-08 DIAGNOSIS — M7062 Trochanteric bursitis, left hip: Secondary | ICD-10-CM | POA: Diagnosis not present

## 2019-04-28 DIAGNOSIS — I739 Peripheral vascular disease, unspecified: Secondary | ICD-10-CM | POA: Diagnosis not present

## 2019-04-28 DIAGNOSIS — R0989 Other specified symptoms and signs involving the circulatory and respiratory systems: Secondary | ICD-10-CM | POA: Diagnosis not present

## 2019-05-02 DIAGNOSIS — D696 Thrombocytopenia, unspecified: Secondary | ICD-10-CM | POA: Diagnosis not present

## 2019-05-02 DIAGNOSIS — M150 Primary generalized (osteo)arthritis: Secondary | ICD-10-CM | POA: Diagnosis not present

## 2019-05-02 DIAGNOSIS — I739 Peripheral vascular disease, unspecified: Secondary | ICD-10-CM | POA: Diagnosis not present

## 2019-05-02 DIAGNOSIS — I8312 Varicose veins of left lower extremity with inflammation: Secondary | ICD-10-CM | POA: Diagnosis not present

## 2019-05-02 DIAGNOSIS — Z7189 Other specified counseling: Secondary | ICD-10-CM | POA: Diagnosis not present

## 2019-05-02 DIAGNOSIS — I10 Essential (primary) hypertension: Secondary | ICD-10-CM | POA: Diagnosis not present

## 2019-07-04 DIAGNOSIS — N390 Urinary tract infection, site not specified: Secondary | ICD-10-CM | POA: Diagnosis not present

## 2019-07-04 DIAGNOSIS — I10 Essential (primary) hypertension: Secondary | ICD-10-CM | POA: Diagnosis not present

## 2019-07-04 DIAGNOSIS — Z0000 Encounter for general adult medical examination without abnormal findings: Secondary | ICD-10-CM | POA: Diagnosis not present

## 2019-07-04 DIAGNOSIS — M150 Primary generalized (osteo)arthritis: Secondary | ICD-10-CM | POA: Diagnosis not present

## 2019-07-04 DIAGNOSIS — D696 Thrombocytopenia, unspecified: Secondary | ICD-10-CM | POA: Diagnosis not present

## 2019-07-04 DIAGNOSIS — I8312 Varicose veins of left lower extremity with inflammation: Secondary | ICD-10-CM | POA: Diagnosis not present

## 2019-07-11 DIAGNOSIS — K589 Irritable bowel syndrome without diarrhea: Secondary | ICD-10-CM | POA: Diagnosis not present

## 2019-07-11 DIAGNOSIS — D692 Other nonthrombocytopenic purpura: Secondary | ICD-10-CM | POA: Diagnosis not present

## 2019-07-11 DIAGNOSIS — N3281 Overactive bladder: Secondary | ICD-10-CM | POA: Diagnosis not present

## 2019-07-11 DIAGNOSIS — Z0000 Encounter for general adult medical examination without abnormal findings: Secondary | ICD-10-CM | POA: Diagnosis not present

## 2019-07-11 DIAGNOSIS — D696 Thrombocytopenia, unspecified: Secondary | ICD-10-CM | POA: Diagnosis not present

## 2019-07-11 DIAGNOSIS — I8312 Varicose veins of left lower extremity with inflammation: Secondary | ICD-10-CM | POA: Diagnosis not present

## 2019-07-11 DIAGNOSIS — I10 Essential (primary) hypertension: Secondary | ICD-10-CM | POA: Diagnosis not present

## 2019-07-11 DIAGNOSIS — I739 Peripheral vascular disease, unspecified: Secondary | ICD-10-CM | POA: Diagnosis not present

## 2019-07-11 DIAGNOSIS — E782 Mixed hyperlipidemia: Secondary | ICD-10-CM | POA: Diagnosis not present

## 2019-07-11 DIAGNOSIS — M150 Primary generalized (osteo)arthritis: Secondary | ICD-10-CM | POA: Diagnosis not present

## 2019-07-13 DIAGNOSIS — Z961 Presence of intraocular lens: Secondary | ICD-10-CM | POA: Diagnosis not present

## 2019-07-13 DIAGNOSIS — H52203 Unspecified astigmatism, bilateral: Secondary | ICD-10-CM | POA: Diagnosis not present

## 2019-07-13 DIAGNOSIS — H40013 Open angle with borderline findings, low risk, bilateral: Secondary | ICD-10-CM | POA: Diagnosis not present

## 2019-07-13 DIAGNOSIS — H02834 Dermatochalasis of left upper eyelid: Secondary | ICD-10-CM | POA: Diagnosis not present

## 2019-07-13 DIAGNOSIS — H04123 Dry eye syndrome of bilateral lacrimal glands: Secondary | ICD-10-CM | POA: Diagnosis not present

## 2019-07-13 DIAGNOSIS — H524 Presbyopia: Secondary | ICD-10-CM | POA: Diagnosis not present

## 2019-07-13 DIAGNOSIS — H5202 Hypermetropia, left eye: Secondary | ICD-10-CM | POA: Diagnosis not present

## 2019-07-13 DIAGNOSIS — Z83511 Family history of glaucoma: Secondary | ICD-10-CM | POA: Diagnosis not present

## 2019-07-13 DIAGNOSIS — H02831 Dermatochalasis of right upper eyelid: Secondary | ICD-10-CM | POA: Diagnosis not present

## 2019-07-13 DIAGNOSIS — H35363 Drusen (degenerative) of macula, bilateral: Secondary | ICD-10-CM | POA: Diagnosis not present

## 2019-11-21 DIAGNOSIS — E782 Mixed hyperlipidemia: Secondary | ICD-10-CM | POA: Diagnosis not present

## 2019-11-21 DIAGNOSIS — I739 Peripheral vascular disease, unspecified: Secondary | ICD-10-CM | POA: Diagnosis not present

## 2019-11-21 DIAGNOSIS — M25561 Pain in right knee: Secondary | ICD-10-CM | POA: Diagnosis not present

## 2019-11-21 DIAGNOSIS — I10 Essential (primary) hypertension: Secondary | ICD-10-CM | POA: Diagnosis not present

## 2019-11-21 DIAGNOSIS — M1711 Unilateral primary osteoarthritis, right knee: Secondary | ICD-10-CM | POA: Diagnosis not present

## 2019-11-28 DIAGNOSIS — E782 Mixed hyperlipidemia: Secondary | ICD-10-CM | POA: Diagnosis not present

## 2019-11-28 DIAGNOSIS — I10 Essential (primary) hypertension: Secondary | ICD-10-CM | POA: Diagnosis not present

## 2019-11-28 DIAGNOSIS — I8312 Varicose veins of left lower extremity with inflammation: Secondary | ICD-10-CM | POA: Diagnosis not present

## 2019-11-28 DIAGNOSIS — R5383 Other fatigue: Secondary | ICD-10-CM | POA: Diagnosis not present

## 2019-11-28 DIAGNOSIS — R42 Dizziness and giddiness: Secondary | ICD-10-CM | POA: Diagnosis not present

## 2019-11-28 DIAGNOSIS — I739 Peripheral vascular disease, unspecified: Secondary | ICD-10-CM | POA: Diagnosis not present

## 2019-12-15 DIAGNOSIS — I739 Peripheral vascular disease, unspecified: Secondary | ICD-10-CM | POA: Diagnosis not present

## 2019-12-15 DIAGNOSIS — R0602 Shortness of breath: Secondary | ICD-10-CM | POA: Diagnosis not present

## 2019-12-15 DIAGNOSIS — R42 Dizziness and giddiness: Secondary | ICD-10-CM | POA: Diagnosis not present

## 2019-12-15 DIAGNOSIS — R5383 Other fatigue: Secondary | ICD-10-CM | POA: Diagnosis not present

## 2019-12-26 DIAGNOSIS — R5383 Other fatigue: Secondary | ICD-10-CM | POA: Diagnosis not present

## 2019-12-26 DIAGNOSIS — D692 Other nonthrombocytopenic purpura: Secondary | ICD-10-CM | POA: Diagnosis not present

## 2019-12-26 DIAGNOSIS — I10 Essential (primary) hypertension: Secondary | ICD-10-CM | POA: Diagnosis not present

## 2019-12-26 DIAGNOSIS — R42 Dizziness and giddiness: Secondary | ICD-10-CM | POA: Diagnosis not present

## 2019-12-26 DIAGNOSIS — E782 Mixed hyperlipidemia: Secondary | ICD-10-CM | POA: Diagnosis not present

## 2020-01-02 DIAGNOSIS — M25561 Pain in right knee: Secondary | ICD-10-CM | POA: Diagnosis not present

## 2020-01-02 DIAGNOSIS — M1711 Unilateral primary osteoarthritis, right knee: Secondary | ICD-10-CM | POA: Diagnosis not present

## 2020-01-05 DIAGNOSIS — Z01818 Encounter for other preprocedural examination: Secondary | ICD-10-CM | POA: Diagnosis not present

## 2020-01-11 DIAGNOSIS — I739 Peripheral vascular disease, unspecified: Secondary | ICD-10-CM | POA: Diagnosis not present

## 2020-01-11 DIAGNOSIS — E782 Mixed hyperlipidemia: Secondary | ICD-10-CM | POA: Diagnosis not present

## 2020-01-11 DIAGNOSIS — D696 Thrombocytopenia, unspecified: Secondary | ICD-10-CM | POA: Diagnosis not present

## 2020-01-11 DIAGNOSIS — I10 Essential (primary) hypertension: Secondary | ICD-10-CM | POA: Diagnosis not present

## 2020-01-11 DIAGNOSIS — M150 Primary generalized (osteo)arthritis: Secondary | ICD-10-CM | POA: Diagnosis not present

## 2020-01-11 DIAGNOSIS — Z01818 Encounter for other preprocedural examination: Secondary | ICD-10-CM | POA: Diagnosis not present

## 2020-01-13 ENCOUNTER — Other Ambulatory Visit: Payer: Self-pay

## 2020-01-13 ENCOUNTER — Encounter (HOSPITAL_COMMUNITY): Payer: Self-pay

## 2020-01-13 ENCOUNTER — Encounter (HOSPITAL_COMMUNITY)
Admission: RE | Admit: 2020-01-13 | Discharge: 2020-01-13 | Disposition: A | Discharge status: Home / Self Care | Payer: PPO | Source: Ambulatory Visit | Attending: Orthopedic Surgery | Admitting: Orthopedic Surgery

## 2020-01-13 DIAGNOSIS — Z01818 Encounter for other preprocedural examination: Secondary | ICD-10-CM | POA: Diagnosis not present

## 2020-01-13 HISTORY — DX: Malignant (primary) neoplasm, unspecified: C80.1

## 2020-01-13 HISTORY — DX: Unspecified osteoarthritis, unspecified site: M19.90

## 2020-01-13 NOTE — Patient Instructions (Signed)
DUE TO COVID-19 ONLY ONE VISITOR IS ALLOWED TO COME WITH YOU AND STAY IN THE WAITING ROOM ONLY DURING PRE OP AND PROCEDURE DAY OF SURGERY. THE 1 VISITOR MAY VISIT WITH YOU AFTER SURGERY IN YOUR PRIVATE ROOM DURING VISITING HOURS ONLY!  YOU NEED TO HAVE A COVID 19 TEST ON: 01/23/20 @            , THIS TEST MUST BE DONE BEFORE SURGERY, COME  [address], [address] [place] , [address].  ([place]) ONCE YOUR COVID TEST IS COMPLETED, PLEASE BEGIN THE QUARANTINE INSTRUCTIONS AS OUTLINED IN YOUR HANDOUT.                Kashlyn S Ray-Spangler    Your procedure is scheduled on: 01/26/20   Report to [facility] Main  Entrance   Report to admitting at: 7:30 AM     Call this number if you have problems the morning of surgery [phone]    Remember:   NO SOLID FOOD AFTER MIDNIGHT THE NIGHT PRIOR TO SURGERY. NOTHING BY MOUTH EXCEPT CLEAR LIQUIDS UNTIL: 7:00 am . PLEASE FINISH ENSURE DRINK PER SURGEON ORDER  WHICH NEEDS TO BE COMPLETED AT: 7:00 am .   CLEAR LIQUID DIET   Foods Allowed                                                                     Foods Excluded  Coffee and tea, regular and decaf                             liquids that you cannot  Plain Jell-O any favor except red or purple                                           see through such as: Fruit ices (not with fruit pulp)                                     milk, soups, orange juice  Iced Popsicles                                    All solid food Carbonated beverages, regular and diet                                    Cranberry, grape and apple juices Sports drinks like Gatorade Lightly seasoned clear broth or consume(fat free) Sugar, honey syrup  Sample Menu Breakfast                                Lunch  Supper Cranberry juice                    Beef broth                            Chicken broth Jell-O                                     Grape juice                            Apple juice Coffee or tea                        Jell-O                                      Popsicle                                                Coffee or tea                        Coffee or tea  _____________________________________________________________________  BRUSH YOUR TEETH MORNING OF SURGERY AND RINSE YOUR MOUTH OUT, NO CHEWING GUM CANDY OR MINTS.     Take these medicines the morning of surgery with A SIP OF WATER:  Amlodipine,premarin,omeprazole                                 You may not have any metal on your body including hair pins and              piercings  Do not wear jewelry, make-up, lotions, powders or perfumes, deodorant             Do not wear nail polish on your fingernails.  Do not shave  48 hours prior to surgery.           Do not bring valuables to the hospital. [place].  Contacts, dentures or bridgework may not be worn into surgery.  Leave suitcase in the car. After surgery it may be brought to your room.     Patients discharged the day of surgery will not be allowed to drive home. IF YOU ARE HAVING SURGERY AND GOING HOME THE SAME DAY, YOU MUST HAVE AN ADULT TO DRIVE YOU HOME AND BE WITH YOU FOR 24 HOURS. YOU MAY GO HOME BY TAXI OR UBER OR ORTHERWISE, BUT AN ADULT MUST ACCOMPANY YOU HOME AND STAY WITH YOU FOR 24 HOURS.  Name and phone number of your driver:  Special Instructions: N/A              Please read over the following fact sheets you were given: _____________________________________________________________________  [facility] - Preparing for Surgery Before surgery, you can play an important role.  Because skin is not sterile, your skin needs to be as free of germs as possible.  You can reduce the number of germs on your skin by washing with CHG (chlorahexidine gluconate) soap before surgery.  CHG is an antiseptic cleaner which kills germs and bonds with the skin to continue  killing germs even after washing. Please DO NOT use if you have an allergy to CHG or antibacterial soaps.  If your skin becomes reddened/irritated stop using the CHG and inform your nurse when you arrive at Short Stay. Do not shave (including legs and underarms) for at least 48 hours prior to the first CHG shower.  You may shave your face/neck. Please follow these instructions carefully:  1.  Shower with CHG Soap the night before surgery and the  morning of Surgery.  2.  If you choose to wash your hair, wash your hair first as usual with your  normal  shampoo.  3.  After you shampoo, rinse your hair and body thoroughly to remove the  shampoo.                           4.  Use CHG as you would any other liquid soap.  You can apply chg directly  to the skin and wash                       Gently with a scrungie or clean washcloth.  5.  Apply the CHG Soap to your body ONLY FROM THE NECK DOWN.   Do not use on face/ open                           Wound or open sores. Avoid contact with eyes, ears mouth and genitals (private parts).                       Wash face,  Genitals (private parts) with your normal soap.             6.  Wash thoroughly, paying special attention to the area where your surgery  will be performed.  7.  Thoroughly rinse your body with warm water from the neck down.  8.  DO NOT shower/wash with your normal soap after using and rinsing off  the CHG Soap.                9.  Pat yourself dry with a clean towel.            10.  Wear clean pajamas.            11.  Place clean sheets on your bed the night of your first shower and do not  sleep with pets. Day of Surgery : Do not apply any lotions/deodorants the morning of surgery.  Please wear clean clothes to the hospital/surgery center.  FAILURE TO FOLLOW THESE INSTRUCTIONS MAY RESULT IN THE CANCELLATION OF YOUR SURGERY PATIENT SIGNATURE_________________________________  NURSE  SIGNATURE__________________________________  ________________________________________________________________________   Adam Phenix  An incentive spirometer is a tool that can help keep your lungs clear and active. This tool measures how well you are filling your lungs with each breath. Taking long deep breaths may help reverse or decrease the chance of developing breathing (pulmonary) problems (especially infection) following:  A long period of time when you are unable to move or be active. BEFORE THE PROCEDURE   If the spirometer includes an indicator to show your best effort, your nurse or respiratory therapist will  set it to a desired goal.  If possible, sit up straight or lean slightly forward. Try not to slouch.  Hold the incentive spirometer in an upright position. INSTRUCTIONS FOR USE  1. Sit on the edge of your bed if possible, or sit up as far as you can in bed or on a chair. 2. Hold the incentive spirometer in an upright position. 3. Breathe out normally. 4. Place the mouthpiece in your mouth and seal your lips tightly around it. 5. Breathe in slowly and as deeply as possible, raising the piston or the ball toward the top of the column. 6. Hold your breath for 3-5 seconds or for as long as possible. Allow the piston or ball to fall to the bottom of the column. 7. Remove the mouthpiece from your mouth and breathe out normally. 8. Rest for a few seconds and repeat Steps 1 through 7 at least 10 times every 1-2 hours when you are awake. Take your time and take a few normal breaths between deep breaths. 9. The spirometer may include an indicator to show your best effort. Use the indicator as a goal to work toward during each repetition. 10. After each set of 10 deep breaths, practice coughing to be sure your lungs are clear. If you have an incision (the cut made at the time of surgery), support your incision when coughing by placing a pillow or rolled up towels firmly  against it. Once you are able to get out of bed, walk around indoors and cough well. You may stop using the incentive spirometer when instructed by your caregiver.  RISKS AND COMPLICATIONS  Take your time so you do not get dizzy or light-headed.  If you are in pain, you may need to take or ask for pain medication before doing incentive spirometry. It is harder to take a deep breath if you are having pain. AFTER USE  Rest and breathe slowly and easily.  It can be helpful to keep track of a log of your progress. Your caregiver can provide you with a simple table to help with this. If you are using the spirometer at home, follow these instructions: [place] IF:   You are having difficultly using the spirometer.  You have trouble using the spirometer as often as instructed.  Your pain medication is not giving enough relief while using the spirometer.  You develop fever of 100.5 F (38.1 C) or higher. SEEK IMMEDIATE MEDICAL CARE IF:   You cough up bloody sputum that had not been present before.  You develop fever of 102 F (38.9 C) or greater.  You develop worsening pain at or near the incision site. MAKE SURE YOU:   Understand these instructions.  Will watch your condition.  Will get help right away if you are not doing well or get worse. Document Released: 12/08/2006 Document Revised: 10/20/2011 Document Reviewed: 02/08/2007 [facility] Patient Information 2014 [place], [place].   ________________________________________________________________________

## 2020-01-13 NOTE — Progress Notes (Signed)
COVID Vaccine Completed: yes Date COVID Vaccine completed:08/2019 COVID vaccine manufacturer: *Pfizer    Moderna   [employer]   PCP - Dr. Ashby Dawes A. LOV: 07/11/19 Cardiologist -   Chest x-ray -  EKG -  Stress Test -  ECHO -  Cardiac Cath -   Sleep Study -  CPAP -   Fasting Blood Sugar -  Checks Blood Sugar _____ times a day  Blood Thinner Instructions: Aspirin Instructions: Last Dose:  Anesthesia review:   Patient denies shortness of breath, fever, cough and chest pain at PAT appointment   Patient verbalized understanding of instructions that were given to them at the PAT appointment. Patient was also instructed that they will need to review over the PAT instructions again at home before surgery.

## 2020-01-16 DIAGNOSIS — H04123 Dry eye syndrome of bilateral lacrimal glands: Secondary | ICD-10-CM | POA: Diagnosis not present

## 2020-01-16 DIAGNOSIS — Z83511 Family history of glaucoma: Secondary | ICD-10-CM | POA: Diagnosis not present

## 2020-01-16 DIAGNOSIS — H11153 Pinguecula, bilateral: Secondary | ICD-10-CM | POA: Diagnosis not present

## 2020-01-16 DIAGNOSIS — H40013 Open angle with borderline findings, low risk, bilateral: Secondary | ICD-10-CM | POA: Diagnosis not present

## 2020-01-17 ENCOUNTER — Other Ambulatory Visit: Payer: Self-pay

## 2020-01-17 ENCOUNTER — Encounter (HOSPITAL_COMMUNITY)
Admission: RE | Admit: 2020-01-17 | Discharge: 2020-01-17 | Disposition: A | Discharge status: Home / Self Care | Payer: PPO | Source: Ambulatory Visit | Attending: Orthopedic Surgery | Admitting: Orthopedic Surgery

## 2020-01-17 DIAGNOSIS — Z01818 Encounter for other preprocedural examination: Secondary | ICD-10-CM | POA: Diagnosis not present

## 2020-01-17 LAB — CBC
HCT: 39.6 % (ref 36.0–46.0)
Hemoglobin: 13 g/dL (ref 12.0–15.0)
MCH: 30.4 pg (ref 26.0–34.0)
MCHC: 32.8 g/dL (ref 30.0–36.0)
MCV: 92.7 fL (ref 80.0–100.0)
Platelets: 143 10*3/uL — ABNORMAL LOW (ref 150–400)
RBC: 4.27 MIL/uL (ref 3.87–5.11)
RDW: 13.4 % (ref 11.5–15.5)
WBC: 4.7 10*3/uL (ref 4.0–10.5)
nRBC: 0 % (ref 0.0–0.2)

## 2020-01-17 LAB — SURGICAL PCR SCREEN
MRSA, PCR: NEGATIVE
Staphylococcus aureus: POSITIVE — AB

## 2020-01-17 LAB — BASIC METABOLIC PANEL
Anion gap: 10 (ref 5–15)
BUN: 17 mg/dL (ref 8–23)
CO2: 28 mmol/L (ref 22–32)
Calcium: 8.6 mg/dL — ABNORMAL LOW (ref 8.9–10.3)
Chloride: 104 mmol/L (ref 98–111)
Creatinine, Ser: 0.58 mg/dL (ref 0.44–1.00)
GFR calc Af Amer: >60 mL/min (ref >60)
GFR calc non Af Amer: >60 mL/min (ref >60)
Glucose, Bld: 93 mg/dL (ref 70–99)
Potassium: 3.5 mmol/L (ref 3.5–5.1)
Sodium: 142 mmol/L (ref 135–145)

## 2020-01-23 ENCOUNTER — Other Ambulatory Visit (HOSPITAL_COMMUNITY)
Admission: RE | Admit: 2020-01-23 | Discharge: 2020-01-23 | Disposition: A | Discharge status: Home / Self Care | Payer: PPO | Source: Ambulatory Visit | Attending: Orthopedic Surgery | Admitting: Orthopedic Surgery

## 2020-01-23 DIAGNOSIS — Z20822 Contact with and (suspected) exposure to covid-19: Secondary | ICD-10-CM | POA: Diagnosis not present

## 2020-01-23 DIAGNOSIS — Z01812 Encounter for preprocedural laboratory examination: Secondary | ICD-10-CM | POA: Insufficient documentation

## 2020-01-23 LAB — SARS CORONAVIRUS 2 (TAT 6-24 HRS): SARS Coronavirus 2: NEGATIVE

## 2020-01-25 NOTE — Anesthesia Preprocedure Evaluation (Addendum)
Anesthesia Evaluation  Patient identified by MRN, date of birth, ID band Patient awake    Reviewed: Allergy & Precautions, NPO status , Patient's Chart, lab work & pertinent test results  Airway Mallampati: II  TM Distance: >3 FB Neck ROM: Full    Dental no notable dental hx. (+) Teeth Intact, Dental Advisory Given   Pulmonary asthma ,    Pulmonary exam normal breath sounds clear to auscultation       Cardiovascular hypertension, Pt. on medications negative cardio ROS Normal cardiovascular exam Rhythm:Regular Rate:Normal     Neuro/Psych  Headaches, PSYCHIATRIC DISORDERS Anxiety    GI/Hepatic Neg liver ROS, GERD  Medicated,  Endo/Other  negative endocrine ROS  Renal/GU negative Renal ROS  negative genitourinary   Musculoskeletal  (+) Arthritis ,   Abdominal   Peds  Hematology negative hematology ROS (+)   Anesthesia Other Findings   Reproductive/Obstetrics                           Anesthesia Physical Anesthesia Plan  ASA: II  Anesthesia Plan: Spinal and Regional   Post-op Pain Management:  Regional for Post-op pain   Induction:   PONV Risk Score and Plan: 2 and Treatment may vary due to age or medical condition, Propofol infusion, Ondansetron and Dexamethasone  Airway Management Planned: Natural Airway  Additional Equipment:   Intra-op Plan:   Post-operative Plan:   Informed Consent: I have reviewed the patients History and Physical, chart, labs and discussed the procedure including the risks, benefits and alternatives for the proposed anesthesia with the patient or authorized representative who has indicated his/her understanding and acceptance.     Dental advisory given  Plan Discussed with: CRNA  Anesthesia Plan Comments:         Anesthesia Quick Evaluation

## 2020-01-26 ENCOUNTER — Encounter (HOSPITAL_COMMUNITY): Payer: Self-pay | Admitting: Orthopedic Surgery

## 2020-01-26 ENCOUNTER — Ambulatory Visit (HOSPITAL_COMMUNITY): Payer: PPO | Admitting: Anesthesiology

## 2020-01-26 ENCOUNTER — Other Ambulatory Visit: Payer: Self-pay

## 2020-01-26 ENCOUNTER — Observation Stay (HOSPITAL_COMMUNITY)
Admission: RE | Admit: 2020-01-26 | Discharge: 2020-01-27 | Disposition: A | Discharge status: Home / Self Care | Payer: PPO | Source: Ambulatory Visit | Attending: Orthopedic Surgery | Admitting: Orthopedic Surgery

## 2020-01-26 ENCOUNTER — Encounter (HOSPITAL_COMMUNITY)
Admission: RE | Disposition: A | Discharge status: Home / Self Care | Payer: Self-pay | Source: Ambulatory Visit | Attending: Orthopedic Surgery

## 2020-01-26 DIAGNOSIS — I10 Essential (primary) hypertension: Secondary | ICD-10-CM | POA: Diagnosis not present

## 2020-01-26 DIAGNOSIS — Z79899 Other long term (current) drug therapy: Secondary | ICD-10-CM | POA: Diagnosis not present

## 2020-01-26 DIAGNOSIS — Z79890 Hormone replacement therapy (postmenopausal): Secondary | ICD-10-CM | POA: Insufficient documentation

## 2020-01-26 DIAGNOSIS — Z888 Allergy status to other drugs, medicaments and biological substances status: Secondary | ICD-10-CM | POA: Insufficient documentation

## 2020-01-26 DIAGNOSIS — Z882 Allergy status to sulfonamides status: Secondary | ICD-10-CM | POA: Diagnosis not present

## 2020-01-26 DIAGNOSIS — D696 Thrombocytopenia, unspecified: Secondary | ICD-10-CM | POA: Diagnosis not present

## 2020-01-26 DIAGNOSIS — K219 Gastro-esophageal reflux disease without esophagitis: Secondary | ICD-10-CM | POA: Insufficient documentation

## 2020-01-26 DIAGNOSIS — D649 Anemia, unspecified: Secondary | ICD-10-CM | POA: Diagnosis not present

## 2020-01-26 DIAGNOSIS — M1711 Unilateral primary osteoarthritis, right knee: Secondary | ICD-10-CM | POA: Diagnosis not present

## 2020-01-26 DIAGNOSIS — G8918 Other acute postprocedural pain: Secondary | ICD-10-CM | POA: Diagnosis not present

## 2020-01-26 DIAGNOSIS — Z96651 Presence of right artificial knee joint: Secondary | ICD-10-CM

## 2020-01-26 HISTORY — PX: TOTAL KNEE ARTHROPLASTY: SHX125

## 2020-01-26 LAB — TYPE AND SCREEN
ABO/RH(D): A POS
Antibody Screen: NEGATIVE

## 2020-01-26 SURGERY — ARTHROPLASTY, KNEE, TOTAL
Anesthesia: Regional | Site: Knee | Laterality: Right

## 2020-01-26 MED ORDER — BUPIVACAINE HCL (PF) 0.25 % IJ SOLN
INTRAMUSCULAR | Status: DC | PRN
  Administered 2020-01-26: 30 mL

## 2020-01-26 MED ORDER — TRANEXAMIC ACID-NACL 1000-0.7 MG/100ML-% IV SOLN
1000.0000 mg | Freq: Once | INTRAVENOUS | Status: AC
  Administered 2020-01-26: 1000 mg via INTRAVENOUS
  Filled 2020-01-26: qty 100

## 2020-01-26 MED ORDER — FENTANYL CITRATE (PF) 100 MCG/2ML IJ SOLN
50.0000 ug | INTRAMUSCULAR | Status: DC
  Administered 2020-01-26: 50 ug via INTRAVENOUS

## 2020-01-26 MED ORDER — DEXAMETHASONE SODIUM PHOSPHATE 10 MG/ML IJ SOLN
INTRAMUSCULAR | Status: DC | PRN
  Administered 2020-01-26: 5 mg

## 2020-01-26 MED ORDER — PROPOFOL 500 MG/50ML IV EMUL
INTRAVENOUS | Status: DC | PRN
  Administered 2020-01-26: 20 mg via INTRAVENOUS
  Administered 2020-01-26: 75 ug/kg/min via INTRAVENOUS
  Administered 2020-01-26: 20 mg via INTRAVENOUS

## 2020-01-26 MED ORDER — HYDROCODONE-ACETAMINOPHEN 7.5-325 MG PO TABS: 1.0000 | ORAL_TABLET | ORAL | Status: DC | PRN

## 2020-01-26 MED ORDER — METHOCARBAMOL 500 MG IVPB - SIMPLE MED
INTRAVENOUS | Status: AC
  Filled 2020-01-26: qty 50

## 2020-01-26 MED ORDER — LACTATED RINGERS IV SOLN
INTRAVENOUS | Status: DC
  Administered 2020-01-26: 1000 mL via INTRAVENOUS

## 2020-01-26 MED ORDER — EPHEDRINE SULFATE-NACL 50-0.9 MG/10ML-% IV SOSY
PREFILLED_SYRINGE | INTRAVENOUS | Status: DC | PRN
  Administered 2020-01-26: 5 mg via INTRAVENOUS

## 2020-01-26 MED ORDER — PROPOFOL 1000 MG/100ML IV EMUL
INTRAVENOUS | Status: AC
  Filled 2020-01-26: qty 100

## 2020-01-26 MED ORDER — FENTANYL CITRATE (PF) 100 MCG/2ML IJ SOLN
INTRAMUSCULAR | Status: AC
  Filled 2020-01-26: qty 2

## 2020-01-26 MED ORDER — KETOROLAC TROMETHAMINE 30 MG/ML IJ SOLN
INTRAMUSCULAR | Status: AC
  Filled 2020-01-26: qty 1

## 2020-01-26 MED ORDER — DEXAMETHASONE SODIUM PHOSPHATE 10 MG/ML IJ SOLN
10.0000 mg | Freq: Once | INTRAMUSCULAR | Status: AC
  Administered 2020-01-27: 10 mg via INTRAVENOUS
  Filled 2020-01-26: qty 1

## 2020-01-26 MED ORDER — FERROUS SULFATE 325 (65 FE) MG PO TABS
325.0000 mg | ORAL_TABLET | Freq: Two times a day (BID) | ORAL | Status: DC
  Administered 2020-01-27: 325 mg via ORAL
  Filled 2020-01-26: qty 1

## 2020-01-26 MED ORDER — BUPIVACAINE IN DEXTROSE 0.75-8.25 % IT SOLN
INTRATHECAL | Status: DC | PRN
  Administered 2020-01-26: 1.4 mL via INTRATHECAL

## 2020-01-26 MED ORDER — ONDANSETRON HCL 4 MG/2ML IJ SOLN
INTRAMUSCULAR | Status: AC
  Filled 2020-01-26: qty 2

## 2020-01-26 MED ORDER — ACETAMINOPHEN 500 MG PO TABS
1000.0000 mg | ORAL_TABLET | Freq: Once | ORAL | Status: AC
  Administered 2020-01-26: 1000 mg via ORAL
  Filled 2020-01-26: qty 2

## 2020-01-26 MED ORDER — METOCLOPRAMIDE HCL 5 MG PO TABS: 5.0000 mg | ORAL_TABLET | Freq: Three times a day (TID) | ORAL | Status: DC | PRN

## 2020-01-26 MED ORDER — POLYETHYLENE GLYCOL 3350 17 G PO PACK
17.0000 g | PACK | Freq: Two times a day (BID) | ORAL | Status: DC
  Administered 2020-01-26 – 2020-01-27 (×2): 17 g via ORAL
  Filled 2020-01-26 (×2): qty 1

## 2020-01-26 MED ORDER — MENTHOL 3 MG MT LOZG: 1.0000 | LOZENGE | OROMUCOSAL | Status: DC | PRN

## 2020-01-26 MED ORDER — ACETAMINOPHEN 325 MG PO TABS: 325.0000 mg | ORAL_TABLET | Freq: Four times a day (QID) | ORAL | Status: DC | PRN

## 2020-01-26 MED ORDER — DIPHENHYDRAMINE HCL 12.5 MG/5ML PO ELIX: 12.5000 mg | ORAL_SOLUTION | ORAL | Status: DC | PRN

## 2020-01-26 MED ORDER — PANTOPRAZOLE SODIUM 40 MG PO TBEC: 40.0000 mg | DELAYED_RELEASE_TABLET | Freq: Every day | ORAL | Status: DC | PRN

## 2020-01-26 MED ORDER — DOCUSATE SODIUM 100 MG PO CAPS
100.0000 mg | ORAL_CAPSULE | Freq: Two times a day (BID) | ORAL | Status: DC
  Administered 2020-01-26 – 2020-01-27 (×2): 100 mg via ORAL
  Filled 2020-01-26 (×2): qty 1

## 2020-01-26 MED ORDER — ALUM & MAG HYDROXIDE-SIMETH 200-200-20 MG/5ML PO SUSP: 15.0000 mL | ORAL | Status: DC | PRN

## 2020-01-26 MED ORDER — CEFAZOLIN SODIUM-DEXTROSE 2-4 GM/100ML-% IV SOLN
2.0000 g | Freq: Four times a day (QID) | INTRAVENOUS | Status: AC
  Administered 2020-01-26 (×2): 2 g via INTRAVENOUS
  Filled 2020-01-26 (×2): qty 100

## 2020-01-26 MED ORDER — SODIUM CHLORIDE (PF) 0.9 % IJ SOLN
INTRAMUSCULAR | Status: DC | PRN
  Administered 2020-01-26: 30 mL

## 2020-01-26 MED ORDER — SODIUM CHLORIDE (PF) 0.9 % IJ SOLN
INTRAMUSCULAR | Status: AC
  Filled 2020-01-26: qty 50

## 2020-01-26 MED ORDER — ONDANSETRON HCL 4 MG PO TABS: 4.0000 mg | ORAL_TABLET | Freq: Four times a day (QID) | ORAL | Status: DC | PRN

## 2020-01-26 MED ORDER — DEXAMETHASONE SODIUM PHOSPHATE 10 MG/ML IJ SOLN
INTRAMUSCULAR | Status: AC
  Filled 2020-01-26: qty 1

## 2020-01-26 MED ORDER — TRANEXAMIC ACID-NACL 1000-0.7 MG/100ML-% IV SOLN
1000.0000 mg | INTRAVENOUS | Status: AC
  Administered 2020-01-26: 1000 mg via INTRAVENOUS
  Filled 2020-01-26: qty 100

## 2020-01-26 MED ORDER — ONDANSETRON HCL 4 MG/2ML IJ SOLN
4.0000 mg | Freq: Four times a day (QID) | INTRAMUSCULAR | Status: DC | PRN
  Administered 2020-01-26 – 2020-01-27 (×2): 4 mg via INTRAVENOUS
  Filled 2020-01-26 (×2): qty 2

## 2020-01-26 MED ORDER — METHOCARBAMOL 500 MG PO TABS
500.0000 mg | ORAL_TABLET | Freq: Four times a day (QID) | ORAL | Status: DC | PRN
  Administered 2020-01-27 (×2): 500 mg via ORAL
  Filled 2020-01-26 (×2): qty 1

## 2020-01-26 MED ORDER — PHENYLEPHRINE HCL (PRESSORS) 10 MG/ML IV SOLN
INTRAVENOUS | Status: AC
  Filled 2020-01-26: qty 2

## 2020-01-26 MED ORDER — METHOCARBAMOL 500 MG IVPB - SIMPLE MED
500.0000 mg | Freq: Four times a day (QID) | INTRAVENOUS | Status: DC | PRN
  Administered 2020-01-26: 500 mg via INTRAVENOUS
  Filled 2020-01-26: qty 50

## 2020-01-26 MED ORDER — 0.9 % SODIUM CHLORIDE (POUR BTL) OPTIME
TOPICAL | Status: DC | PRN
  Administered 2020-01-26: 1000 mL

## 2020-01-26 MED ORDER — CEFAZOLIN SODIUM-DEXTROSE 2-4 GM/100ML-% IV SOLN
2.0000 g | INTRAVENOUS | Status: AC
  Administered 2020-01-26: 2 g via INTRAVENOUS
  Filled 2020-01-26: qty 100

## 2020-01-26 MED ORDER — HYDROCODONE-ACETAMINOPHEN 5-325 MG PO TABS
1.0000 | ORAL_TABLET | ORAL | Status: DC | PRN
  Administered 2020-01-26: 1 via ORAL
  Administered 2020-01-27 (×2): 2 via ORAL
  Administered 2020-01-27: 1 via ORAL
  Filled 2020-01-26 (×2): qty 1
  Filled 2020-01-26 (×2): qty 2

## 2020-01-26 MED ORDER — PROPOFOL 10 MG/ML IV BOLUS
INTRAVENOUS | Status: AC
  Filled 2020-01-26: qty 20

## 2020-01-26 MED ORDER — AMLODIPINE BESYLATE 5 MG PO TABS
2.5000 mg | ORAL_TABLET | Freq: Every day | ORAL | Status: DC
  Filled 2020-01-26: qty 1

## 2020-01-26 MED ORDER — HYDROMORPHONE HCL 1 MG/ML IJ SOLN
0.5000 mg | INTRAMUSCULAR | Status: DC | PRN
  Administered 2020-01-26 (×2): 1 mg via INTRAVENOUS
  Filled 2020-01-26 (×2): qty 1

## 2020-01-26 MED ORDER — ORAL CARE MOUTH RINSE: 15.0000 mL | Freq: Once | OROMUCOSAL | Status: AC

## 2020-01-26 MED ORDER — ROPIVACAINE HCL 5 MG/ML IJ SOLN
INTRAMUSCULAR | Status: DC | PRN
  Administered 2020-01-26: 20 mL via PERINEURAL

## 2020-01-26 MED ORDER — POVIDONE-IODINE 10 % EX SWAB
2.0000 "application " | Freq: Once | CUTANEOUS | Status: AC
  Administered 2020-01-26: 2 via TOPICAL

## 2020-01-26 MED ORDER — CHLORHEXIDINE GLUCONATE 0.12 % MT SOLN
15.0000 mL | Freq: Once | OROMUCOSAL | Status: AC
  Administered 2020-01-26: 15 mL via OROMUCOSAL

## 2020-01-26 MED ORDER — BISACODYL 10 MG RE SUPP: 10.0000 mg | Freq: Every day | RECTAL | Status: DC | PRN

## 2020-01-26 MED ORDER — ASPIRIN 81 MG PO CHEW
81.0000 mg | CHEWABLE_TABLET | Freq: Two times a day (BID) | ORAL | Status: DC
  Administered 2020-01-26 – 2020-01-27 (×2): 81 mg via ORAL
  Filled 2020-01-26 (×2): qty 1

## 2020-01-26 MED ORDER — DEXAMETHASONE SODIUM PHOSPHATE 10 MG/ML IJ SOLN
10.0000 mg | Freq: Once | INTRAMUSCULAR | Status: AC
  Administered 2020-01-26: 8 mg via INTRAVENOUS

## 2020-01-26 MED ORDER — KETOROLAC TROMETHAMINE 30 MG/ML IJ SOLN
INTRAMUSCULAR | Status: DC | PRN
  Administered 2020-01-26: 30 mg

## 2020-01-26 MED ORDER — STERILE WATER FOR IRRIGATION IR SOLN
Status: DC | PRN
  Administered 2020-01-26: 1000 mL

## 2020-01-26 MED ORDER — EPHEDRINE 5 MG/ML INJ
INTRAVENOUS | Status: AC
  Filled 2020-01-26: qty 10

## 2020-01-26 MED ORDER — ONDANSETRON HCL 4 MG/2ML IJ SOLN
INTRAMUSCULAR | Status: DC | PRN
  Administered 2020-01-26: 4 mg via INTRAVENOUS

## 2020-01-26 MED ORDER — METOCLOPRAMIDE HCL 5 MG/ML IJ SOLN: 5.0000 mg | Freq: Three times a day (TID) | INTRAMUSCULAR | Status: DC | PRN

## 2020-01-26 MED ORDER — PHENOL 1.4 % MT LIQD: 1.0000 | OROMUCOSAL | Status: DC | PRN

## 2020-01-26 MED ORDER — SODIUM CHLORIDE 0.9 % IR SOLN
Status: DC | PRN
  Administered 2020-01-26: 1000 mL

## 2020-01-26 MED ORDER — FENTANYL CITRATE (PF) 100 MCG/2ML IJ SOLN
25.0000 ug | INTRAMUSCULAR | Status: DC | PRN
  Administered 2020-01-26 (×2): 50 ug via INTRAVENOUS

## 2020-01-26 MED ORDER — MAGNESIUM CITRATE PO SOLN: 1.0000 | Freq: Once | ORAL | Status: DC | PRN

## 2020-01-26 MED ORDER — BUPIVACAINE HCL (PF) 0.25 % IJ SOLN
INTRAMUSCULAR | Status: AC
  Filled 2020-01-26: qty 30

## 2020-01-26 MED ORDER — SODIUM CHLORIDE 0.9 % IV SOLN: INTRAVENOUS | Status: DC

## 2020-01-26 SURGICAL SUPPLY — 68 items
ADH SKN CLS APL DERMABOND .7 (GAUZE/BANDAGES/DRESSINGS) ×1
ATTUNE MED ANAT PAT 35 KNEE (Knees) ×1 IMPLANT
ATTUNE MED ANAT PAT 35MM KNEE (Knees) ×1 IMPLANT
ATTUNE PSFEM RTSZ4 NARCEM KNEE (Femur) ×2 IMPLANT
ATTUNE PSRP INSR SZ4 7 KNEE (Insert) ×1 IMPLANT
ATTUNE PSRP INSR SZ4 7MM KNEE (Insert) ×1 IMPLANT
BAG SPEC THK2 15X12 ZIP CLS (MISCELLANEOUS)
BAG ZIPLOCK 12X15 (MISCELLANEOUS) IMPLANT
BASE TIBIAL ROT PLAT SZ 3 KNEE (Knees) IMPLANT
BLADE SAW SGTL 11.0X1.19X90.0M (BLADE) IMPLANT
BLADE SAW SGTL 13.0X1.19X90.0M (BLADE) ×3 IMPLANT
BLADE SURG SZ10 CARB STEEL (BLADE) ×6 IMPLANT
BNDG CMPR MED 15X6 ELC VLCR LF (GAUZE/BANDAGES/DRESSINGS) ×1
BNDG ELASTIC 6X15 VLCR STRL LF (GAUZE/BANDAGES/DRESSINGS) ×2 IMPLANT
BNDG ELASTIC 6X5.8 VLCR STR LF (GAUZE/BANDAGES/DRESSINGS) ×3 IMPLANT
BOWL SMART MIX CTS (DISPOSABLE) ×3 IMPLANT
BSPLAT TIB 3 CMNT ROT PLAT STR (Knees) ×1 IMPLANT
CEMENT HV SMART SET (Cement) ×4 IMPLANT
COVER SURGICAL LIGHT HANDLE (MISCELLANEOUS) ×3 IMPLANT
COVER WAND RF STERILE (DRAPES) IMPLANT
CUFF TOURN SGL QUICK 34 (TOURNIQUET CUFF) ×3
CUFF TRNQT CYL 34X4.125X (TOURNIQUET CUFF) ×1 IMPLANT
DECANTER SPIKE VIAL GLASS SM (MISCELLANEOUS) ×6 IMPLANT
DERMABOND ADVANCED (GAUZE/BANDAGES/DRESSINGS) ×2
DERMABOND ADVANCED .7 DNX12 (GAUZE/BANDAGES/DRESSINGS) ×1 IMPLANT
DRAPE U-SHAPE 47X51 STRL (DRAPES) ×3 IMPLANT
DRESSING AQUACEL AG SP 3.5X10 (GAUZE/BANDAGES/DRESSINGS) ×1 IMPLANT
DRSG AQUACEL AG ADV 3.5X10 (GAUZE/BANDAGES/DRESSINGS) ×2 IMPLANT
DRSG AQUACEL AG SP 3.5X10 (GAUZE/BANDAGES/DRESSINGS) ×3
DURAPREP 26ML APPLICATOR (WOUND CARE) ×6 IMPLANT
ELECT REM PT RETURN 15FT ADLT (MISCELLANEOUS) ×3 IMPLANT
GLOVE BIO SURGEON STRL SZ 6 (GLOVE) ×3 IMPLANT
GLOVE BIOGEL PI IND STRL 6.5 (GLOVE) ×1 IMPLANT
GLOVE BIOGEL PI IND STRL 7.5 (GLOVE) ×1 IMPLANT
GLOVE BIOGEL PI IND STRL 8.5 (GLOVE) ×1 IMPLANT
GLOVE BIOGEL PI INDICATOR 6.5 (GLOVE) ×2
GLOVE BIOGEL PI INDICATOR 7.5 (GLOVE) ×2
GLOVE BIOGEL PI INDICATOR 8.5 (GLOVE)
GLOVE ECLIPSE 8.0 STRL XLNG CF (GLOVE) ×1 IMPLANT
GLOVE ORTHO TXT STRL SZ7.5 (GLOVE) ×3 IMPLANT
GOWN STRL REUS W/ TWL LRG LVL3 (GOWN DISPOSABLE) ×1 IMPLANT
GOWN STRL REUS W/TWL 2XL LVL3 (GOWN DISPOSABLE) ×3 IMPLANT
GOWN STRL REUS W/TWL LRG LVL3 (GOWN DISPOSABLE) ×6 IMPLANT
HANDPIECE INTERPULSE COAX TIP (DISPOSABLE) ×3
HOLDER FOLEY CATH W/STRAP (MISCELLANEOUS) ×2 IMPLANT
KIT TURNOVER KIT A (KITS) IMPLANT
MANIFOLD NEPTUNE II (INSTRUMENTS) ×3 IMPLANT
NDL SAFETY ECLIPSE 18X1.5 (NEEDLE) IMPLANT
NEEDLE HYPO 18GX1.5 SHARP (NEEDLE) ×3
NS IRRIG 1000ML POUR BTL (IV SOLUTION) ×3 IMPLANT
PACK TOTAL KNEE CUSTOM (KITS) ×3 IMPLANT
PENCIL SMOKE EVACUATOR (MISCELLANEOUS) ×2 IMPLANT
PIN DRILL FIX HALF THREAD (BIT) ×2 IMPLANT
PIN STEINMAN FIXATION KNEE (PIN) ×2 IMPLANT
PROTECTOR NERVE ULNAR (MISCELLANEOUS) ×3 IMPLANT
SET HNDPC FAN SPRY TIP SCT (DISPOSABLE) ×1 IMPLANT
SET PAD KNEE POSITIONER (MISCELLANEOUS) ×3 IMPLANT
SUT MNCRL AB 4-0 PS2 18 (SUTURE) ×3 IMPLANT
SUT STRATAFIX PDS+ 0 24IN (SUTURE) ×3 IMPLANT
SUT VIC AB 1 CT1 36 (SUTURE) ×3 IMPLANT
SUT VIC AB 2-0 CT1 27 (SUTURE) ×9
SUT VIC AB 2-0 CT1 TAPERPNT 27 (SUTURE) ×3 IMPLANT
SYR 3ML LL SCALE MARK (SYRINGE) ×3 IMPLANT
TIBIAL BASE ROT PLAT SZ 3 KNEE (Knees) ×3 IMPLANT
TRAY FOLEY MTR SLVR 16FR STAT (SET/KITS/TRAYS/PACK) ×3 IMPLANT
WATER STERILE IRR 1000ML POUR (IV SOLUTION) ×6 IMPLANT
WRAP KNEE MAXI GEL POST OP (GAUZE/BANDAGES/DRESSINGS) ×3 IMPLANT
YANKAUER SUCT BULB TIP 10FT TU (MISCELLANEOUS) ×3 IMPLANT

## 2020-01-26 NOTE — Discharge Instructions (Addendum)

## 2020-01-26 NOTE — Interval H&P Note (Signed)
History and Physical Interval Note:  01/26/2020 8:53 AM  Heather Fitzgerald  has presented today for surgery, with the diagnosis of Right knee osteoarthritis.  The various methods of treatment have been discussed with the patient and family. After consideration of risks, benefits and other options for treatment, the patient has consented to  Procedure(s) with comments: TOTAL KNEE ARTHROPLASTY (Right) - 70 mins as a surgical intervention.  The patient's history has been reviewed, patient examined, no change in status, stable for surgery.  I have reviewed the patient's chart and labs.  Questions were answered to the patient's satisfaction.     Mauri Pole

## 2020-01-26 NOTE — Op Note (Signed)
NAME:  [place] RECORD NO.:  [id number]                             FACILITY:  [facility]      PHYSICIAN:  Pietro Cassis. Alvan Dame, M.D.  DATE OF BIRTH:  07/16/1934      DATE OF PROCEDURE:  01/26/2020                                     OPERATIVE REPORT         PREOPERATIVE DIAGNOSIS:  Right knee osteoarthritis.      POSTOPERATIVE DIAGNOSIS:  Right knee osteoarthritis.      FINDINGS:  The patient was noted to have complete loss of cartilage and   bone-on-bone arthritis with associated osteophytes in the lateral and patellofemoral compartments of   the knee with a significant synovitis and associated effusion.  The patient had failed months of conservative treatment including medications, injection therapy, activity modification.     PROCEDURE:  Right total knee replacement.      COMPONENTS USED:  DePuy Attune rotating platform posterior stabilized knee   system, a size 4N femur, 3 tibia, size size 7 mm PS AOX insert, and 35 anatomic patellar   button.      SURGEON:  Pietro Cassis. Alvan Dame, M.D.      ASSISTANT:  Griffith Citron, PA-C.      ANESTHESIA:  Regional and Spinal.      SPECIMENS:  None.      COMPLICATION:  None.      DRAINS:  None.  EBL: <300cc      TOURNIQUET TIME:  8 min at 250 mmHg      The patient was stable to the recovery room.      INDICATION FOR PROCEDURE:  Heather Fitzgerald is a 86 y.o. female patient of   mine.  The patient had been seen, evaluated, and treated for months conservatively in the   office with medication, activity modification, and injections.  The patient had   radiographic changes of bone-on-bone arthritis with endplate sclerosis and osteophytes noted.  Based on the radiographic changes and failed conservative measures, the patient   decided to proceed with definitive treatment, total knee replacement.  Risks of infection, DVT, component failure, need for revision surgery, neurovascular injury were reviewed in  the office setting.  The postop course was reviewed stressing the efforts to maximize post-operative satisfaction and function.  Consent was obtained for benefit of pain   relief.      PROCEDURE IN DETAIL:  The patient was brought to the operative theater.   Once adequate anesthesia, preoperative antibiotics, 2 gm of Ancef,1 gm of Tranexamic Acid, and 10 mg of Decadron administered, the patient was positioned supine with a right thigh tourniquet placed.  The  right lower extremity was prepped and draped in sterile fashion.  A time-   out was performed identifying the patient, planned procedure, and the appropriate extremity.      The right lower extremity was placed in the [facility] leg holder.  The leg was   exsanguinated, tourniquet elevated to 250 mmHg.  A midline incision was   made followed by median parapatellar arthrotomy.  Following initial   exposure, attention  was first directed to the patella.  Precut   measurement was noted to be 22 mm.  I resected down to 13 mm and used a   35 anatomic patellar button to restore patellar height as well as cover the cut surface.      The lug holes were drilled and a metal shim was placed to protect the   patella from retractors and saw blade during the procedure.      At this point, attention was now directed to the femur.  The femoral   canal was opened with a drill, irrigated to try to prevent fat emboli.  An   intramedullary rod was passed at 3 degrees valgus, 9 mm of bone was   resected off the distal femur.  Following this resection, the tibia was   subluxated anteriorly.  Using the extramedullary guide, 2 mm of bone was resected off   the proximal lateral tibia.  We confirmed the gap would be   stable medially and laterally with a size 5 spacer block as well as confirmed that the tibial cut was perpendicular in the coronal plane, checking with an alignment rod.      Once this was done, I sized the femur to be a size 4 in the anterior-    posterior dimension, chose a narrow component based on medial and   lateral dimension.  The size 4 rotation block was then pinned in   position anterior referenced using the C-clamp to set rotation.  The   anterior, posterior, and  chamfer cuts were made without difficulty nor   notching making certain that I was along the anterior cortex to help   with flexion gap stability.      The final box cut was made off the lateral aspect of distal femur.      At this point, the tibia was sized to be a size 3.  The size 3 tray was   then pinned in position through the medial third of the tubercle,   drilled, and keel punched.  Trial reduction was now carried with a 4 femur,  5 tibia, a size size 7 mm PS insert, and the 35 anatomic patella botton.  The knee was brought to full extension with good flexion stability with the patella   tracking through the trochlea without application of pressure.  Given   all these findings the trial components removed.  Final components were   opened and cement was mixed.  The knee was irrigated with normal saline solution and pulse lavage.  The synovial lining was   then injected with 30 cc of 0.25% Marcaine with epinephrine, 1 cc of Toradol and 30 cc of NS for a total of 61 cc.     Final implants were then cemented onto cleaned and dried cut surfaces of bone with the knee brought to extension with a size 7 mm PS trial insert.      Once the cement had fully cured, excess cement was removed   throughout the knee.  I confirmed that I was satisfied with the range of   motion and stability, and the final size 7 mm PS AOX insert was chosen.  It was   placed into the knee.      The tourniquet had been let down at 8 minutes.  No significant   hemostasis was required.  The extensor mechanism was then reapproximated using #1 Vicryl and #1 Stratafix sutures with the knee   in flexion.  The   remaining wound was closed with 2-0 Vicryl and running 4-0 Monocryl.   The knee was  cleaned, dried, dressed sterilely using Dermabond and   Aquacel dressing.  The patient was then   brought to recovery room in stable condition, tolerating the procedure   well.   Please note that Physician Assistant, Griffith Citron, PA-C was present for the entirety of the case, and was utilized for pre-operative positioning, peri-operative retractor management, general facilitation of the procedure and for primary wound closure at the end of the case.              Pietro Cassis Alvan Dame, M.D.    01/26/2020 10:20 AM

## 2020-01-26 NOTE — Progress Notes (Signed)
AssistedDr. Chelsey Woodrum with right, ultrasound guided, adductor canal block. Side rails up, monitors on throughout procedure. See vital signs in flow sheet. Tolerated Procedure well.  

## 2020-01-26 NOTE — H&P (Signed)
TOTAL KNEE ADMISSION H&P  Patient is being admitted for right total knee arthroplasty.  Subjective:  Chief Complaint:right knee pain.  HPI: Heather Fitzgerald, 86 y.o. female, has a history of pain and functional disability in the right knee due to arthritis and has failed non-surgical conservative treatments for greater than 12 weeks to includecorticosteriod injections and activity modification.  Onset of symptoms was gradual, starting 3 years ago with gradually worsening course since that time. The patient noted prior procedures on the knee to include   two open knee surgeries to remove ganglion cysts on the right knee on the right knee(s).  Patient currently rates pain in the right knee(s) at 8 out of 10 with activity. Patient has worsening of pain with activity and weight bearing and pain that interferes with activities of daily living.  Patient has evidence of joint space narrowing by imaging studies. There is no active infection.  Patient Active Problem List   Diagnosis Date Noted  . Digital mucous cyst 01/21/2018  . Postoperative anemia due to acute blood loss 03/29/2014  . S/P left hip conversion 03/27/2014  . Constipation 05/17/2013  . Acute blood loss anemia 05/17/2013  . Pain in left hip 05/17/2013  . Essential hypertension, benign 05/13/2013  . Thrombocytopenia, unspecified ([place]) 05/13/2013  . GERD (gastroesophageal reflux disease) 05/13/2013  . Intertrochanteric fracture of left hip ([place]) 05/12/2013   Past Medical History:  Diagnosis Date  . Anxiety   . Arthritis   . Asthma    hx of   . Cancer [facility])    face pre cancerous lesion removed  . GERD (gastroesophageal reflux disease)   . Headache(784.0)   . Hypertension   . Thrombocytopenia ([place])     Past Surgical History:  Procedure Laterality Date  . ABDOMINAL HYSTERECTOMY    . ANKLE FUSION Left   . CONVERSION TO TOTAL HIP Left 03/27/2014   Procedure: CONVERSION OF FAILED LEFT HIP HEMI TO TOTAL HIP ARTHROPLASTY;   Surgeon: Mauri Pole, MD;  Location: [facility];  Service: Orthopedics;  Laterality: Left;  . EYE SURGERY     bilateral cataract surgery   . GANGLION CYST EXCISION     right knee   . HIP ARTHROPLASTY Left 05/13/2013   Procedure: ARTHROPLASTY BIPOLAR HIP;  Surgeon: Augustin Schooling, MD;  Location: [place];  Service: Orthopedics;  Laterality: Left;  . knee cyst surgery     . left ankle surgery      due to fracture   . OVARIAN CYST SURGERY      No current facility-administered medications for this encounter.   Current Outpatient Medications  Medication Sig Dispense Refill Last Dose  . amLODipine (NORVASC) 2.5 MG tablet Take 2.5 mg by mouth daily with breakfast.      . estrogens, conjugated, (PREMARIN) 0.625 MG tablet Take 0.625 mg by mouth daily. Take daily for 21 days then do not take for 7 days.     Marland Kitchen omeprazole (PRILOSEC) 20 MG capsule Take 20 mg by mouth daily as needed (acid reflux).     . cyclobenzaprine (FLEXERIL) 10 MG tablet Take 1 tablet (10 mg total) by mouth 3 (three) times daily as needed for muscle spasms. (Patient not taking: Reported on 01/06/2020) 30 tablet 0 Not Taking at Unknown time  . docusate sodium 100 MG CAPS Take 100 mg by mouth 2 (two) times daily. (Patient not taking: Reported on 01/06/2020) 10 capsule 0 Not Taking at Unknown time  . polyethylene glycol (MIRALAX / GLYCOLAX) packet  Take 17 g by mouth 2 (two) times daily. (Patient not taking: Reported on 01/06/2020) 14 each 0 Not Taking at Unknown time   Allergies  Allergen Reactions  . Iohexol Hives     Code: HIVES, Desc: (+) SKIN TEST 104YRS AGO, OK W/ 1VP 15 YRS AGO, TODAY W/ HIVES, 50MG  BENADRYL PO.Marland KitchenPT OK, SUGGESTED 13 PRE MEDS FOR FUTURE IV CONTRAST PER DR.MATTERN//A.C.PT CALLS [place] 2 DAYS POST INJ, SHE HAS BEEN NAUSEATED SINCE EXAM & HAS STARTED TO ITCH AGAIN,,, WE RE, Onset Date: [id number]   . Sulfa Antibiotics Nausea Only  . Losartan Rash    Social History   Tobacco Use  . Smoking status: Never Smoker  . Smokeless  tobacco: Never Used  Substance Use Topics  . Alcohol use: No    No family history on file.   Review of Systems  Constitutional: Negative for chills and fever.  Respiratory: Negative for cough and shortness of breath.   Cardiovascular: Negative for chest pain.  Gastrointestinal: Negative for nausea and vomiting.  Musculoskeletal: Positive for arthralgias.    Objective:  Physical Exam Patient is an 86 year old female.  Well nourished and well developed. General: Alert and oriented x3, cooperative and pleasant, no acute distress. Head: normocephalic, atraumatic, neck supple. Eyes: EOMI. Respiratory: breath sounds clear in all fields, no wheezing, rales, or rhonchi. Cardiovascular: Regular rate and rhythm, no murmurs, gallops or rubs. Abdomen: non-tender to palpation and soft, normoactive bowel sounds.  Musculoskeletal: Right knee exam: Neutral to valgus Slight flexion contracture No palpable effusion Flexion 120 with tightness Stable ligaments Currently neurovascular intact  Calves soft and nontender. Motor function intact in LE. Strength 5/5 LE bilaterally. Neuro: Distal pulses 2+. Sensation to light touch intact in LE. Vital signs in last 24 hours:    Labs:   Estimated body mass index is 21.85 kg/m as calculated from the following:   Height as of 01/17/20: 5' 1.5" (1.562 m).   Weight as of 01/17/20: 53.3 kg.   Imaging Review Plain radiographs demonstrate severe degenerative joint disease of the right knee(s). The overall alignment isneutral. The bone quality appears to be adequate for age and reported activity level.   Assessment/Plan:  End stage arthritis, right knee   The patient history, physical examination, clinical judgment of the provider and imaging studies are consistent with end stage degenerative joint disease of the right knee(s) and total knee arthroplasty is deemed medically necessary. The treatment options including medical management, injection  therapy arthroscopy and arthroplasty were discussed at length. The risks and benefits of total knee arthroplasty were presented and reviewed. The risks due to aseptic loosening, infection, stiffness, patella tracking problems, thromboembolic complications and other imponderables were discussed. The patient acknowledged the explanation, agreed to proceed with the plan and consent was signed. Patient is being admitted for inpatient treatment for surgery, pain control, PT, OT, prophylactic antibiotics, VTE prophylaxis, progressive ambulation and ADL's and discharge planning. The patient is planning to be discharged home.  Therapy Plans: HHPT then outpatient therapy at [facility] Disposition: Home with husband's daughter Planned DVT Prophylaxis: aspirin 81mg  BID DME needed: none PCP: Dr. Ashby Dawes, clearance received TXA: IV Allergies: IV Contrast - kidney issues, Sulfa drugs - stomach irritation Anesthesia Concerns: none BMI: 21.7 Not diabetic.  Other: Norco okay. She has numbness in bilateral toes at baseline.  Patient's anticipated LOS is less than 2 midnights, meeting these requirements: - Lives within 1 hour of care - Has a competent adult at home to recover with post-op recover -  NO history of  - Chronic pain requiring opiods  - Diabetes  - Coronary Artery Disease  - Heart failure  - Heart attack  - Stroke  - DVT/VTE  - Cardiac arrhythmia  - Respiratory Failure/COPD  - Renal failure  - Anemia  - Advanced Liver disease  - Patient was instructed on what medications to stop prior to surgery. - Follow-up visit in 2 weeks with Dr. Alvan Dame - Begin physical therapy following surgery - Pre-operative lab work as pre-surgical testing - Prescriptions will be provided in hospital at time of discharge  Griffith Citron, PA-C Orthopedic Surgery [facility] [phone]

## 2020-01-26 NOTE — Plan of Care (Signed)

## 2020-01-26 NOTE — Evaluation (Signed)
Physical Therapy Evaluation Patient Details Name: Heather Fitzgerald MRN: [id number] DOB: 1933/12/31 Today's Date: 01/26/2020   History of Present Illness  Patient is 86 y.o. female s/p Rt TKA on 01/26/20 with PMH significant for HTN, GERD, Asthma, OA, anxiety, Lt THA in 2014 and revision in 2015.    Clinical Impression  Heather Fitzgerald is a 86 y.o. female POD 0 s/p Rt TKA. Patient reports independence with mobility at baseline. Patient is now limited by functional impairments (see PT problem list below) and requires min assist for bed mobility. Patient was limited today by nausea and was unable to progress mobility to transfers or gait training. RN provided nausea medication at EOS. Patient will benefit from continued skilled PT interventions to address impairments and progress towards PLOF. Acute PT will follow to progress mobility and stair training in preparation for safe discharge home.     Follow Up Recommendations Follow surgeon's recommendation for DC plan and follow-up therapies;Outpatient PT    Equipment Recommendations  None recommended by PT    Recommendations for Other Services       Precautions / Restrictions Precautions Precautions: Fall Restrictions Weight Bearing Restrictions: No Other Position/Activity Restrictions: WBAT      Mobility  Bed Mobility Overal bed mobility: Needs Assistance Bed Mobility: Supine to Sit;Sit to Supine     Supine to sit: Min assist;HOB elevated Sit to supine: Min assist   General bed mobility comments: cues for bring LE's off EOB and to reach for bed rail to pivot. Assist to raise trunk. Min assist to lower back to supine and raise LE's into bed.  Transfers      General transfer comment: deferred due to nausea  Ambulation/Gait       Stairs     Wheelchair Mobility    Modified Rankin (Stroke Patients Only)       Balance Overall balance assessment: Needs assistance Sitting-balance support: Feet  supported Sitting balance-Leahy Scale: Fair            Pertinent Vitals/Pain Pain Assessment: 0-10 Pain Score: 5  Pain Location: Rt knee Pain Descriptors / Indicators: Aching;Discomfort Pain Intervention(s): Limited activity within patient's tolerance;Monitored during session;Repositioned;Ice applied    Home Living Family/patient expects to be discharged to:: Private residence Living Arrangements: Other relatives;Spouse/significant other Available Help at Discharge: Family Type of Home: House Home Access: Stairs to enter Entrance Stairs-Rails: None Entrance Stairs-Number of Steps: 2 Home Layout: Two level;Laundry or work area in basement;Able to live on main level with bedroom/bathroom;Full bath on main level Home Equipment: Walker - 2 wheels;Cane - single point      Prior Function Level of Independence: Independent               Hand Dominance   Dominant Hand: Left    Extremity/Trunk Assessment   Upper Extremity Assessment Upper Extremity Assessment: Overall WFL for tasks assessed    Lower Extremity Assessment Lower Extremity Assessment: RLE deficits/detail RLE Deficits / Details: good quad activation, no extensor lag with SLR RLE Sensation: WNL RLE Coordination: WNL    Cervical / Trunk Assessment Cervical / Trunk Assessment: Normal  Communication   Communication: No difficulties  Cognition Arousal/Alertness: Awake/alert Behavior During Therapy: WFL for tasks assessed/performed Overall Cognitive Status: Within Functional Limits for tasks assessed           General Comments General comments (skin integrity, edema, etc.): BP at start of session in supine 126/60. After sitting up and reporting nausea BP found to be 126/115, reassesed on opposit  arm and found to be 103/64. Pt returned to supine.    Exercises     Assessment/Plan    PT Assessment Patient needs continued PT services  PT Problem List Decreased strength;Decreased range of  motion;Decreased activity tolerance;Decreased balance;Decreased mobility;Decreased knowledge of use of DME       PT Treatment Interventions DME instruction;Gait training;Stair training;Functional mobility training;Therapeutic activities;Balance training;Therapeutic exercise;Patient/family education    PT Goals (Current goals can be found in the Care Plan section)  Acute Rehab PT Goals Patient Stated Goal: get better and get home PT Goal Formulation: With patient Time For Goal Achievement: 02/02/20 Potential to Achieve Goals: Good    Frequency 7X/week    AM-PAC PT "6 Clicks" Mobility  Outcome Measure Help needed turning from your back to your side while in a flat bed without using bedrails?: A Little Help needed moving from lying on your back to sitting on the side of a flat bed without using bedrails?: A Little Help needed moving to and from a bed to a chair (including a wheelchair)?: A Little Help needed standing up from a chair using your arms (e.g., wheelchair or bedside chair)?: A Little Help needed to walk in hospital room?: A Lot Help needed climbing 3-5 steps with a railing? : A Lot 6 Click Score: 16    End of Session Equipment Utilized During Treatment: Gait belt Activity Tolerance: Treatment limited secondary to medical complications (Comment) (nausea) Patient left: in bed;with call bell/phone within reach;with bed alarm set;with family/visitor present Nurse Communication: Mobility status (request for nausea meds) PT Visit Diagnosis: Muscle weakness (generalized) (M62.81);Difficulty in walking, not elsewhere classified (R26.2)    Time: 1470-9295 PT Time Calculation (min) (ACUTE ONLY): 25 min   Charges:   PT Evaluation $PT Eval Low Complexity: 1 Low     Verner Mould, DPT Acute Rehabilitation Services  Office [phone] Pager [phone]  01/26/2020 5:21 PM

## 2020-01-26 NOTE — Anesthesia Postprocedure Evaluation (Signed)
Anesthesia Post Note  Patient: Heather Fitzgerald  Procedure(s) Performed: TOTAL KNEE ARTHROPLASTY (Right Knee)     Patient location during evaluation: PACU Anesthesia Type: Regional and Spinal Level of consciousness: oriented and awake and alert Pain management: pain level controlled Vital Signs Assessment: post-procedure vital signs reviewed and stable Respiratory status: spontaneous breathing, respiratory function stable and patient connected to nasal cannula oxygen Cardiovascular status: blood pressure returned to baseline and stable Postop Assessment: no headache, no backache and no apparent nausea or vomiting Anesthetic complications: no   No complications documented.  Last Vitals:  Vitals:   01/26/20 1524 01/26/20 1625  BP: 126/60 (!) 122/59  Pulse: 61 62  Resp: 17 17  Temp: 36.4 C 36.4 C  SpO2: 100% 100%    Last Pain:  Vitals:   01/26/20 1625  TempSrc: Oral  PainSc:                  Orlin Kann L Celestial Barnfield

## 2020-01-26 NOTE — Transfer of Care (Signed)
Immediate Anesthesia Transfer of Care Note  Patient: Heather Fitzgerald  Procedure(s) Performed: TOTAL KNEE ARTHROPLASTY (Right Knee)  Patient Location: PACU  Anesthesia Type:MAC and Spinal  Level of Consciousness: awake, alert , oriented and patient cooperative  Airway & Oxygen Therapy: Patient Spontanous Breathing and Patient connected to face mask oxygen  Post-op Assessment: Report given to RN and Post -op Vital signs reviewed and stable  Post vital signs: Reviewed and stable  Last Vitals:  Vitals Value Taken Time  BP 150/77 01/26/20 1211  Temp    Pulse 80 01/26/20 1213  Resp 15 01/26/20 1213  SpO2 100 % 01/26/20 1213  Vitals shown include unvalidated device data.  Last Pain:  Vitals:   01/26/20 0955  TempSrc:   PainSc: 0-No pain      Patients Stated Pain Goal: 4 (35/82/51 8984)  Complications: No complications documented.

## 2020-01-26 NOTE — Anesthesia Procedure Notes (Signed)
Anesthesia Regional Block: Adductor canal block   Pre-Anesthetic Checklist: ,, timeout performed, Correct Patient, Correct Site, Correct Laterality, Correct Procedure, Correct Position, site marked, Risks and benefits discussed,  Surgical consent,  Pre-op evaluation,  At surgeon's request and post-op pain management  Laterality: Right  Prep: Maximum Sterile Barrier Precautions used, chloraprep       Needles:  Injection technique: Single-shot  Needle Type: Echogenic Stimulator Needle     Needle Length: 9cm  Needle Gauge: 22     Additional Needles:   Procedures:,,,, ultrasound used (permanent image in chart),,,,  Narrative:  Start time: 01/26/2020 9:35 AM End time: 01/26/2020 9:45 AM Injection made incrementally with aspirations every 5 mL.  Performed by: Personally  Anesthesiologist: Freddrick March, MD  Additional Notes: Monitors applied. No increased pain on injection. No increased resistance to injection. Injection made in 5cc increments. Good needle visualization. Patient tolerated procedure well.

## 2020-01-27 DIAGNOSIS — Z96651 Presence of right artificial knee joint: Secondary | ICD-10-CM | POA: Diagnosis not present

## 2020-01-27 DIAGNOSIS — M1711 Unilateral primary osteoarthritis, right knee: Secondary | ICD-10-CM | POA: Diagnosis not present

## 2020-01-27 LAB — BASIC METABOLIC PANEL
Anion gap: 9 (ref 5–15)
BUN: 8 mg/dL (ref 8–23)
CO2: 27 mmol/L (ref 22–32)
Calcium: 8.2 mg/dL — ABNORMAL LOW (ref 8.9–10.3)
Chloride: 101 mmol/L (ref 98–111)
Creatinine, Ser: 0.68 mg/dL (ref 0.44–1.00)
GFR calc Af Amer: >60 mL/min (ref >60)
GFR calc non Af Amer: >60 mL/min (ref >60)
Glucose, Bld: 183 mg/dL — ABNORMAL HIGH (ref 70–99)
Potassium: 4.3 mmol/L (ref 3.5–5.1)
Sodium: 137 mmol/L (ref 135–145)

## 2020-01-27 LAB — CBC
HCT: 33 % — ABNORMAL LOW (ref 36.0–46.0)
Hemoglobin: 10.8 g/dL — ABNORMAL LOW (ref 12.0–15.0)
MCH: 30.2 pg (ref 26.0–34.0)
MCHC: 32.7 g/dL (ref 30.0–36.0)
MCV: 92.2 fL (ref 80.0–100.0)
Platelets: 140 10*3/uL — ABNORMAL LOW (ref 150–400)
RBC: 3.58 MIL/uL — ABNORMAL LOW (ref 3.87–5.11)
RDW: 13.3 % (ref 11.5–15.5)
WBC: 9.8 10*3/uL (ref 4.0–10.5)
nRBC: 0 % (ref 0.0–0.2)

## 2020-01-27 MED ORDER — HYDROCODONE-ACETAMINOPHEN 5-325 MG PO TABS: 1.0000 | ORAL_TABLET | Freq: Four times a day (QID) | ORAL | 0 refills | Status: DC | PRN

## 2020-01-27 MED ORDER — DOCUSATE SODIUM 100 MG PO CAPS
100.0000 mg | ORAL_CAPSULE | Freq: Two times a day (BID) | ORAL | 0 refills | Status: DC
Start: 2020-01-27 — End: 2022-10-04

## 2020-01-27 MED ORDER — METHOCARBAMOL 500 MG PO TABS: 500.0000 mg | ORAL_TABLET | Freq: Four times a day (QID) | ORAL | 0 refills | Status: DC | PRN

## 2020-01-27 MED ORDER — POLYETHYLENE GLYCOL 3350 17 G PO PACK
17.0000 g | PACK | Freq: Two times a day (BID) | ORAL | 0 refills | Status: DC
Start: 2020-01-27 — End: 2022-10-04

## 2020-01-27 MED ORDER — ONDANSETRON HCL 4 MG PO TABS: 4.0000 mg | ORAL_TABLET | Freq: Three times a day (TID) | ORAL | 0 refills | Status: DC | PRN

## 2020-01-27 MED ORDER — ASPIRIN 81 MG PO CHEW
81.0000 mg | CHEWABLE_TABLET | Freq: Two times a day (BID) | ORAL | 0 refills | Status: AC
Start: 2020-01-27 — End: 2020-02-26

## 2020-01-27 MED ORDER — FERROUS SULFATE 325 (65 FE) MG PO TABS: 325.0000 mg | ORAL_TABLET | Freq: Three times a day (TID) | ORAL | 0 refills | Status: DC

## 2020-01-27 NOTE — Progress Notes (Signed)
Pt provided with d/c instructions. After discussing the pt's plan of care upon d/c home, the pt denied any further questions or concerns.  

## 2020-01-27 NOTE — Discharge Summary (Signed)
Patient ID: Heather Fitzgerald MRN: [id number] DOB/AGE: 84-01-1934 84 y.o.  Admit date: 01/26/2020 Discharge date: 01/27/2020  Admission Diagnoses:  Principal Problem:   Right knee OA Active Problems:   Status post total right knee replacement   Discharge Diagnoses:  Same  Past Medical History:  Diagnosis Date  . Anxiety   . Arthritis   . Asthma    hx of   . Cancer [facility])    face pre cancerous lesion removed  . GERD (gastroesophageal reflux disease)   . Headache(784.0)   . Hypertension   . Thrombocytopenia ([place])     Surgeries: Procedure(s):  RIGHT TOTAL KNEE ARTHROPLASTY on 01/26/2020   Consultants: N/A  Discharged Condition: Improved  Hospital Course: Heather Fitzgerald is an 86 y.o. female who was admitted 01/26/2020 for operative treatment ofOsteoarthritis of right knee. Patient has severe unremitting pain that affects sleep, daily activities, and work/hobbies. After pre-op clearance the patient was taken to the operating room on 01/26/2020 and underwent  Procedure(s): RIGHT TOTAL KNEE ARTHROPLASTY.    Patient was given perioperative antibiotics:  Anti-infectives (From admission, onward)   Start     Dose/Rate Route Frequency Ordered Stop   01/26/20 1600  ceFAZolin (ANCEF) IVPB 2g/100 mL premix        2 g 200 mL/hr over 30 Minutes Intravenous Every 6 hours 01/26/20 1317 01/26/20 2145   01/26/20 0745  ceFAZolin (ANCEF) IVPB 2g/100 mL premix        2 g 200 mL/hr over 30 Minutes Intravenous On call to O.R. 01/26/20 0741 01/26/20 1052       Patient was given sequential compression devices, early ambulation, and chemoprophylaxis to prevent DVT.  Patient benefited maximally from hospital stay and there were no complications.    Recent vital signs:  Patient Vitals for the past 24 hrs:  BP Temp Temp src Pulse Resp SpO2 Height Weight  01/27/20 0525 131/81 97.9 F (36.6 C) -- 69 15 100 % -- --  01/27/20 0155 127/63 97.9 F (36.6 C) -- 65 15 100 % -- --   01/26/20 2057 (!) 151/68 98.3 F (36.8 C) -- 74 16 100 % -- --  01/26/20 1625 (!) 122/59 97.6 F (36.4 C) Oral 62 17 100 % -- --  01/26/20 1524 126/60 97.6 F (36.4 C) -- 61 17 100 % -- --  01/26/20 1417 123/63 97.7 F (36.5 C) Oral 65 17 100 % -- --  01/26/20 1319 (!) 148/74 97.7 F (36.5 C) -- 81 16 98 % -- --  01/26/20 1300 (!) 141/63 (!) 97.5 F (36.4 C) -- 69 15 100 % -- --  01/26/20 1245 (!) 154/66 -- -- 68 12 100 % -- --  01/26/20 1230 136/64 -- -- 73 10 100 % -- --  01/26/20 1215 132/71 -- -- 79 14 100 % -- --  01/26/20 1211 (!) 150/77 (!) 97.5 F (36.4 C) -- 76 -- 100 % -- --  01/26/20 0955 (!) 160/71 -- -- 70 11 100 % -- --  01/26/20 0950 (!) 169/78 -- -- 71 10 100 % -- --  01/26/20 0945 (!) 196/74 -- -- 69 11 100 % -- --  01/26/20 0800 -- -- -- -- -- -- 5' 1.5" (1.562 m) 53.3 kg  01/26/20 0750 (!) 168/75 99.4 F (37.4 C) Oral 77 13 100 % -- --     Recent laboratory studies:  Recent Labs    01/27/20 0322  WBC 9.8  HGB 10.8*  HCT 33.0*  PLT 140*  NA 137  K 4.3  CL 101  CO2 27  BUN 8  CREATININE 0.68  GLUCOSE 183*  CALCIUM 8.2*     Discharge Medications:   Allergies as of 01/27/2020      Reactions   Iohexol Hives    Code: HIVES, Desc: (+) SKIN TEST 20YRS AGO, OK W/ 1VP 15 YRS AGO, TODAY W/ HIVES, 50MG  BENADRYL PO.Marland KitchenPT OK, SUGGESTED 13 PRE MEDS FOR FUTURE IV CONTRAST PER DR.MATTERN//A.C.PT CALLS [place] 2 DAYS POST INJ, SHE HAS BEEN NAUSEATED SINCE EXAM & HAS STARTED TO ITCH AGAIN,,, WE RE, Onset Date: [id number]   Sulfa Antibiotics Nausea Only   Losartan Rash      Medication List    STOP taking these medications   cyclobenzaprine 10 MG tablet Commonly known as: FLEXERIL     TAKE these medications   amLODipine 2.5 MG tablet Commonly known as: NORVASC Take 2.5 mg by mouth daily with breakfast.   aspirin 81 MG chewable tablet Commonly known as: Aspirin Childrens Chew 1 tablet (81 mg total) by mouth 2 (two) times daily. Take for 4 weeks, then resume  regular dose.   docusate sodium 100 MG capsule Commonly known as: Colace Take 1 capsule (100 mg total) by mouth 2 (two) times daily.   estrogens (conjugated) 0.625 MG tablet Commonly known as: PREMARIN Take 0.625 mg by mouth daily. Take daily for 21 days then do not take for 7 days.   ferrous sulfate 325 (65 FE) MG tablet Commonly known as: FerrouSul Take 1 tablet (325 mg total) by mouth 3 (three) times daily with meals for 14 days.   HYDROcodone-acetaminophen 5-325 MG tablet Commonly known as: Norco Take 1-2 tablets by mouth every 6 (six) hours as needed for moderate pain or severe pain.   methocarbamol 500 MG tablet Commonly known as: Robaxin Take 1 tablet (500 mg total) by mouth every 6 (six) hours as needed for muscle spasms.   omeprazole 20 MG capsule Commonly known as: PRILOSEC Take 20 mg by mouth daily as needed (acid reflux).   ondansetron 4 MG tablet Commonly known as: Zofran Take 1 tablet (4 mg total) by mouth every 8 (eight) hours as needed for nausea or vomiting.   polyethylene glycol 17 g packet Commonly known as: MIRALAX / GLYCOLAX Take 17 g by mouth 2 (two) times daily.            Discharge Care Instructions  (From admission, onward)         Start     Ordered   01/27/20 0000  Change dressing       Comments: Maintain surgical dressing until follow up in the clinic. If the edges start to pull up, may reinforce with tape. If the dressing is no longer working, may remove and cover with gauze and tape, but must keep the area dry and clean.  Call with any questions or concerns.   01/27/20 0743          Diagnostic Studies: No results found.  Disposition: Discharge disposition: 01-Home or Self Care       Discharge Instructions    Call MD / Call 911   Complete by: As directed    If you experience chest pain or shortness of breath, CALL 911 and be transported to the hospital emergency room.  If you develope a fever above 101 F, pus (white  drainage) or increased drainage or redness at the wound, or calf pain, call your surgeon's office.   Change dressing  Complete by: As directed    Maintain surgical dressing until follow up in the clinic. If the edges start to pull up, may reinforce with tape. If the dressing is no longer working, may remove and cover with gauze and tape, but must keep the area dry and clean.  Call with any questions or concerns.   Constipation Prevention   Complete by: As directed    Drink plenty of fluids.  Prune juice may be helpful.  You may use a stool softener, such as Colace (over the counter) 100 mg twice a day.  Use MiraLax (over the counter) for constipation as needed.   Diet - low sodium heart healthy   Complete by: As directed    Discharge instructions   Complete by: As directed    Maintain surgical dressing until follow up in the clinic. If the edges start to pull up, may reinforce with tape. If the dressing is no longer working, may remove and cover with gauze and tape, but must keep the area dry and clean.  Follow up in 2 weeks at [facility]. Call with any questions or concerns.   Increase activity slowly as tolerated   Complete by: As directed    Weight bearing as tolerated with assist device (walker, cane, etc) as directed, use it as long as suggested by your surgeon or therapist, typically at least 4-6 weeks.   TED hose   Complete by: As directed    Use stockings (TED hose) for 2 weeks on both leg(s).  You may remove them at night for sleeping.       Follow-up Information    Paralee Cancel, MD. Schedule an appointment as soon as possible for a visit in 2 weeks.   Specialty: Orthopedic Surgery Contact information: [address] [place] [address] [phone]                Signed: Lucille Passy [facility] 01/27/2020, 7:44 AM

## 2020-01-27 NOTE — Progress Notes (Signed)
Physical Therapy Treatment Patient Details Name: Heather Fitzgerald MRN: [id number] DOB: Nov 06, 1933 Today's Date: 01/27/2020    History of Present Illness Patient is 86 y.o. female s/p Rt TKA on 01/26/20 with PMH significant for HTN, GERD, Asthma, OA, anxiety, Lt THA in 2014 and revision in 2015.    PT Comments    Pt progressing well with mobility and with c/o mild dizziness only - BP ambulating 133/65.  Pt reviewed stairs and HEP with written instruction provided and reviewed.  Pt eager for return home and with first OP PT appt set for Monday, 01/30/20.   Follow Up Recommendations  Follow surgeon's recommendation for DC plan and follow-up therapies;Outpatient PT     Equipment Recommendations  Rolling walker with 5" wheels    Recommendations for Other Services       Precautions / Restrictions Precautions Precautions: Fall Restrictions Weight Bearing Restrictions: No Other Position/Activity Restrictions: WBAT    Mobility  Bed Mobility Overal bed mobility: Needs Assistance Bed Mobility: Supine to Sit     Supine to sit: Supervision     General bed mobility comments: pt self cues for sequence  Transfers Overall transfer level: Needs assistance Equipment used: Rolling walker (2 wheeled) Transfers: Sit to/from Stand Sit to Stand: Min guard;Supervision         General transfer comment: cues for LE management and use of UEs to self assist  Ambulation/Gait Ambulation/Gait assistance: Min guard;Supervision Gait Distance (Feet): 100 Feet Assistive device: Rolling walker (2 wheeled) Gait Pattern/deviations: Step-to pattern;Decreased step length - right;Decreased step length - left;Shuffle;Trunk flexed Gait velocity: decr   General Gait Details: min cues for sequence, posture and position from RW   Stairs Stairs: Yes Stairs assistance: Min assist Stair Management: No rails;Step to pattern;Backwards;With walker Number of Stairs: 4 General stair comments: 2 steps  twice bkwd with RW, cues for sequence and foot/RW placement and with written instruction provided   Wheelchair Mobility    Modified Rankin (Stroke Patients Only)       Balance Overall balance assessment: Needs assistance Sitting-balance support: Feet supported Sitting balance-Leahy Scale: Good     Standing balance support: Bilateral upper extremity supported Standing balance-Leahy Scale: Fair                              Cognition Arousal/Alertness: Awake/alert Behavior During Therapy: WFL for tasks assessed/performed Overall Cognitive Status: Within Functional Limits for tasks assessed                                        Exercises Total Joint Exercises Ankle Circles/Pumps: AROM;Both;20 reps;Supine Quad Sets: AROM;Both;10 reps;Supine Heel Slides: AAROM;Right;15 reps;Supine Straight Leg Raises: AAROM;AROM;Right;15 reps;Supine Long Arc Quad: AROM;Right;10 reps;Seated Goniometric ROM: AAROM R knee -5 - 95     General Comments        Pertinent Vitals/Pain Pain Assessment: 0-10 Pain Score: 5  Pain Location: Rt knee Pain Descriptors / Indicators: Aching;Discomfort Pain Intervention(s): Limited activity within patient's tolerance;Monitored during session;Premedicated before session;Ice applied    Home Living                      Prior Function            PT Goals (current goals can now be found in the care plan section) Acute Rehab PT Goals Patient Stated Goal: get better and  get home PT Goal Formulation: With patient Time For Goal Achievement: 02/02/20 Potential to Achieve Goals: Good Progress towards PT goals: Progressing toward goals    Frequency    7X/week      PT Plan Current plan remains appropriate    Co-evaluation              AM-PAC PT "6 Clicks" Mobility   Outcome Measure  Help needed turning from your back to your side while in a flat bed without using bedrails?: A Little Help needed moving  from lying on your back to sitting on the side of a flat bed without using bedrails?: A Little Help needed moving to and from a bed to a chair (including a wheelchair)?: A Little Help needed standing up from a chair using your arms (e.g., wheelchair or bedside chair)?: A Little Help needed to walk in hospital room?: A Little Help needed climbing 3-5 steps with a railing? : A Little 6 Click Score: 18    End of Session Equipment Utilized During Treatment: Gait belt Activity Tolerance: Patient tolerated treatment well Patient left: in chair;with call bell/phone within reach;with chair alarm set Nurse Communication: Mobility status PT Visit Diagnosis: Muscle weakness (generalized) (M62.81);Difficulty in walking, not elsewhere classified (R26.2)     Time: 0312-8118 PT Time Calculation (min) (ACUTE ONLY): 40 min  Charges:  $Gait Training: 8-22 mins $Therapeutic Exercise: 8-22 mins $Therapeutic Activity: 8-22 mins                     Debe Coder PT Acute Rehabilitation Services Pager [phone] Office [phone]    Krithika Tome 01/27/2020, 2:54 PM

## 2020-01-27 NOTE — Progress Notes (Addendum)
Physical Therapy Treatment Patient Details Name: Heather Fitzgerald MRN: [id number] DOB: 1934-05-08 Today's Date: 01/27/2020    History of Present Illness Patient is 86 y.o. female s/p Rt TKA on 01/26/20 with PMH significant for HTN, GERD, Asthma, OA, anxiety, Lt THA in 2014 and revision in 2015.    PT Comments    Pt motivated and with no nausea this am.  Pt up to ambulate short distance in hall but with c/o mild dizziness - pt states this is not uncommon at home - BP sitting 112/55, standing 107/49 and after ambulating 108/51. Pt hopes to progress to dc home this pm.   Follow Up Recommendations  Follow surgeon's recommendation for DC plan and follow-up therapies;Outpatient PT     Equipment Recommendations  Rolling walker with 5" wheels    Recommendations for Other Services       Precautions / Restrictions Precautions Precautions: Fall Restrictions Weight Bearing Restrictions: No Other Position/Activity Restrictions: WBAT    Mobility  Bed Mobility Overal bed mobility: Needs Assistance Bed Mobility: Supine to Sit     Supine to sit: Min guard     General bed mobility comments: cues or sequence and use of L LE to self assist  Transfers Overall transfer level: Needs assistance Equipment used: Rolling walker (2 wheeled) Transfers: Sit to/from Stand Sit to Stand: Min guard         General transfer comment: cues for LE management and use of UEs to self assist  Ambulation/Gait Ambulation/Gait assistance: Min assist;Min guard Gait Distance (Feet): 100 Feet Assistive device: Rolling walker (2 wheeled) Gait Pattern/deviations: Step-to pattern;Decreased step length - right;Decreased step length - left;Shuffle;Trunk flexed Gait velocity: decr   General Gait Details: cues for sequence, posture and position from [employer]             Wheelchair Mobility    Modified Rankin (Stroke Patients Only)       Balance Overall balance assessment: Needs  assistance Sitting-balance support: Feet supported Sitting balance-Leahy Scale: Good     Standing balance support: Bilateral upper extremity supported Standing balance-Leahy Scale: Poor                              Cognition Arousal/Alertness: Awake/alert Behavior During Therapy: WFL for tasks assessed/performed Overall Cognitive Status: Within Functional Limits for tasks assessed                                        Exercises  Total Joint Exercises Ankle Circles/Pumps: AROM;Both;20 reps;Supine Quad Sets: AROM;Both;10 reps;Supine Heel Slides: AAROM;Right;15 reps;Supine Straight Leg Raises: AAROM;AROM;Right;15 reps;Supine Long Arc Quad: AROM;Right;10 reps;Seated Goniometric ROM: AAROM R knee -5 - 95     General Comments        Pertinent Vitals/Pain Pain Assessment: 0-10 Pain Score: 5  Pain Location: Rt knee Pain Descriptors / Indicators: Aching;Discomfort Pain Intervention(s): Limited activity within patient's tolerance;Monitored during session;Premedicated before session;Ice applied    Home Living                      Prior Function            PT Goals (current goals can now be found in the care plan section) Acute Rehab PT Goals Patient Stated Goal: get better and get home PT Goal Formulation: With patient Time For Goal Achievement: 02/02/20  Potential to Achieve Goals: Good Progress towards PT goals: Progressing toward goals    Frequency    7X/week      PT Plan Current plan remains appropriate    Co-evaluation              AM-PAC PT "6 Clicks" Mobility   Outcome Measure  Help needed turning from your back to your side while in a flat bed without using bedrails?: A Little Help needed moving from lying on your back to sitting on the side of a flat bed without using bedrails?: A Little Help needed moving to and from a bed to a chair (including a wheelchair)?: A Little Help needed standing up from a chair  using your arms (e.g., wheelchair or bedside chair)?: A Little Help needed to walk in hospital room?: A Little Help needed climbing 3-5 steps with a railing? : A Little 6 Click Score: 18    End of Session Equipment Utilized During Treatment: Gait belt Activity Tolerance: Patient tolerated treatment well Patient left: in chair;with call bell/phone within reach;with chair alarm set Nurse Communication: Mobility status PT Visit Diagnosis: Muscle weakness (generalized) (M62.81);Difficulty in walking, not elsewhere classified (R26.2)     Time: 8527-7824 PT Time Calculation (min) (ACUTE ONLY): 43 min  Charges:  $Gait Training: 8-22 mins $Therapeutic Exercise: 8-22 mins $Therapeutic Activity: 8-22 mins                     Debe Coder PT Acute Rehabilitation Services Pager [phone] Office [phone]    Heather Fitzgerald 01/27/2020, 2:46 PM

## 2020-01-27 NOTE — TOC Transition Note (Addendum)
Transition of Care [facility]) - CM/SW Discharge Note   Patient Details  Name: Heather Fitzgerald MRN: [id number] Date of Birth: 07-25-1934  Transition of Care [facility]) CM/SW Contact:  Lia Hopping, Perla Phone Number: 01/27/2020, 9:22 AM   Clinical Narrative:    Therapy Plan: OPPT  Patient notified PT staff she does not have RW. Youth RW and 3 in 1 ordered and delivered to bedside.   Nurse inquired about HHPT, Per, patient she is to go home straight to outpatient per PA request.    Final next level of care: OP Rehab Barriers to Discharge: No Barriers Identified   Patient Goals and CMS Choice     Choice offered to / list presented to : NA  Discharge Placement                       Discharge Plan and Services                DME Arranged: 3-N-1, [facility] youth DME Agency: [employer] Date DME Agency Contacted: 01/27/20 Time DME Agency Contacted: [phone] Representative spoke with at DME Agency: Ovid Curd HH Arranged: NA [place] Agency: NA        Social Determinants of Health ([place]) Interventions     Readmission Risk Interventions No flowsheet data found.

## 2020-01-27 NOTE — Progress Notes (Signed)
Patient ID: Heather Fitzgerald, female   DOB: 11-29-1933, 86 y.o.   MRN: [id number] Subjective: 1 Day Post-Op Procedure(s) (LRB): TOTAL KNEE ARTHROPLASTY (Right)    Patient reports pain as mild to moderate.  Problem with PONV yesterday.  Better this am so far - no change in medication noted No formal therapy yesterday due to PONV  Objective:   VITALS:   Vitals:   01/27/20 0155 01/27/20 0525  BP: 127/63 131/81  Pulse: 65 69  Resp: 15 15  Temp: 97.9 F (36.6 C) 97.9 F (36.6 C)  SpO2: 100% 100%    Neurovascular intact Incision: dressing C/D/I  Demonstrates good knee flexion while in bed this am  LABS Recent Labs    01/27/20 0322  HGB 10.8*  HCT 33.0*  WBC 9.8  PLT 140*    Recent Labs    01/27/20 0322  NA 137  K 4.3  BUN 8  CREATININE 0.68  GLUCOSE 183*    No results for input(s): LABPT, INR in the last 72 hours.   Assessment/Plan: 1 Day Post-Op Procedure(s) (LRB): TOTAL KNEE ARTHROPLASTY (Right)   Advance diet Up with therapy  Plan for discharge today if progresses well with therapy RTC in 2 weeks

## 2020-01-30 ENCOUNTER — Encounter (HOSPITAL_COMMUNITY): Payer: Self-pay | Admitting: Orthopedic Surgery

## 2020-01-30 DIAGNOSIS — M1711 Unilateral primary osteoarthritis, right knee: Secondary | ICD-10-CM | POA: Diagnosis not present

## 2020-02-01 DIAGNOSIS — M1711 Unilateral primary osteoarthritis, right knee: Secondary | ICD-10-CM | POA: Diagnosis not present

## 2020-02-03 DIAGNOSIS — M1711 Unilateral primary osteoarthritis, right knee: Secondary | ICD-10-CM | POA: Diagnosis not present

## 2020-02-06 DIAGNOSIS — M1711 Unilateral primary osteoarthritis, right knee: Secondary | ICD-10-CM | POA: Diagnosis not present

## 2020-02-08 DIAGNOSIS — M1711 Unilateral primary osteoarthritis, right knee: Secondary | ICD-10-CM | POA: Diagnosis not present

## 2020-02-10 DIAGNOSIS — Z96651 Presence of right artificial knee joint: Secondary | ICD-10-CM | POA: Diagnosis not present

## 2020-02-10 DIAGNOSIS — Z471 Aftercare following joint replacement surgery: Secondary | ICD-10-CM | POA: Diagnosis not present

## 2020-02-11 DIAGNOSIS — M1711 Unilateral primary osteoarthritis, right knee: Secondary | ICD-10-CM | POA: Diagnosis not present

## 2020-02-13 DIAGNOSIS — M1711 Unilateral primary osteoarthritis, right knee: Secondary | ICD-10-CM | POA: Diagnosis not present

## 2020-02-15 DIAGNOSIS — M1711 Unilateral primary osteoarthritis, right knee: Secondary | ICD-10-CM | POA: Diagnosis not present

## 2020-02-17 DIAGNOSIS — M1711 Unilateral primary osteoarthritis, right knee: Secondary | ICD-10-CM | POA: Diagnosis not present

## 2020-02-20 DIAGNOSIS — M1711 Unilateral primary osteoarthritis, right knee: Secondary | ICD-10-CM | POA: Diagnosis not present

## 2020-02-21 DIAGNOSIS — M1711 Unilateral primary osteoarthritis, right knee: Secondary | ICD-10-CM | POA: Diagnosis not present

## 2020-02-24 DIAGNOSIS — M1711 Unilateral primary osteoarthritis, right knee: Secondary | ICD-10-CM | POA: Diagnosis not present

## 2020-02-28 DIAGNOSIS — M1711 Unilateral primary osteoarthritis, right knee: Secondary | ICD-10-CM | POA: Diagnosis not present

## 2020-02-29 DIAGNOSIS — M1711 Unilateral primary osteoarthritis, right knee: Secondary | ICD-10-CM | POA: Diagnosis not present

## 2020-03-02 DIAGNOSIS — M1711 Unilateral primary osteoarthritis, right knee: Secondary | ICD-10-CM | POA: Diagnosis not present

## 2020-03-05 DIAGNOSIS — Z471 Aftercare following joint replacement surgery: Secondary | ICD-10-CM | POA: Diagnosis not present

## 2020-03-05 DIAGNOSIS — Z96651 Presence of right artificial knee joint: Secondary | ICD-10-CM | POA: Diagnosis not present

## 2020-03-05 DIAGNOSIS — M1711 Unilateral primary osteoarthritis, right knee: Secondary | ICD-10-CM | POA: Diagnosis not present

## 2020-03-07 DIAGNOSIS — M1711 Unilateral primary osteoarthritis, right knee: Secondary | ICD-10-CM | POA: Diagnosis not present

## 2020-03-09 DIAGNOSIS — M1711 Unilateral primary osteoarthritis, right knee: Secondary | ICD-10-CM | POA: Diagnosis not present

## 2020-03-12 DIAGNOSIS — M1711 Unilateral primary osteoarthritis, right knee: Secondary | ICD-10-CM | POA: Diagnosis not present

## 2020-03-14 DIAGNOSIS — M1711 Unilateral primary osteoarthritis, right knee: Secondary | ICD-10-CM | POA: Diagnosis not present

## 2020-03-16 DIAGNOSIS — M1711 Unilateral primary osteoarthritis, right knee: Secondary | ICD-10-CM | POA: Diagnosis not present

## 2020-03-19 DIAGNOSIS — M1711 Unilateral primary osteoarthritis, right knee: Secondary | ICD-10-CM | POA: Diagnosis not present

## 2020-03-21 DIAGNOSIS — M1711 Unilateral primary osteoarthritis, right knee: Secondary | ICD-10-CM | POA: Diagnosis not present

## 2020-03-23 DIAGNOSIS — M1711 Unilateral primary osteoarthritis, right knee: Secondary | ICD-10-CM | POA: Diagnosis not present

## 2020-03-26 DIAGNOSIS — M1711 Unilateral primary osteoarthritis, right knee: Secondary | ICD-10-CM | POA: Diagnosis not present

## 2020-03-28 DIAGNOSIS — M1711 Unilateral primary osteoarthritis, right knee: Secondary | ICD-10-CM | POA: Diagnosis not present

## 2020-03-30 DIAGNOSIS — M1711 Unilateral primary osteoarthritis, right knee: Secondary | ICD-10-CM | POA: Diagnosis not present

## 2020-04-02 DIAGNOSIS — M1711 Unilateral primary osteoarthritis, right knee: Secondary | ICD-10-CM | POA: Diagnosis not present

## 2020-04-04 DIAGNOSIS — M1711 Unilateral primary osteoarthritis, right knee: Secondary | ICD-10-CM | POA: Diagnosis not present

## 2020-04-06 DIAGNOSIS — M1711 Unilateral primary osteoarthritis, right knee: Secondary | ICD-10-CM | POA: Diagnosis not present

## 2020-04-09 DIAGNOSIS — M1711 Unilateral primary osteoarthritis, right knee: Secondary | ICD-10-CM | POA: Diagnosis not present

## 2020-04-11 DIAGNOSIS — R110 Nausea: Secondary | ICD-10-CM | POA: Diagnosis not present

## 2020-04-11 DIAGNOSIS — M1711 Unilateral primary osteoarthritis, right knee: Secondary | ICD-10-CM | POA: Diagnosis not present

## 2020-04-13 DIAGNOSIS — M1711 Unilateral primary osteoarthritis, right knee: Secondary | ICD-10-CM | POA: Diagnosis not present

## 2020-04-16 DIAGNOSIS — M1711 Unilateral primary osteoarthritis, right knee: Secondary | ICD-10-CM | POA: Diagnosis not present

## 2020-04-18 DIAGNOSIS — M1711 Unilateral primary osteoarthritis, right knee: Secondary | ICD-10-CM | POA: Diagnosis not present

## 2020-04-19 DIAGNOSIS — R110 Nausea: Secondary | ICD-10-CM | POA: Diagnosis not present

## 2020-04-19 DIAGNOSIS — Z23 Encounter for immunization: Secondary | ICD-10-CM | POA: Diagnosis not present

## 2020-04-26 DIAGNOSIS — M1711 Unilateral primary osteoarthritis, right knee: Secondary | ICD-10-CM | POA: Diagnosis not present

## 2020-05-03 DIAGNOSIS — M1711 Unilateral primary osteoarthritis, right knee: Secondary | ICD-10-CM | POA: Diagnosis not present

## 2020-05-11 DIAGNOSIS — M1711 Unilateral primary osteoarthritis, right knee: Secondary | ICD-10-CM | POA: Diagnosis not present

## 2020-05-18 DIAGNOSIS — M1711 Unilateral primary osteoarthritis, right knee: Secondary | ICD-10-CM | POA: Diagnosis not present

## 2020-05-25 DIAGNOSIS — M1711 Unilateral primary osteoarthritis, right knee: Secondary | ICD-10-CM | POA: Diagnosis not present

## 2020-06-01 DIAGNOSIS — M1711 Unilateral primary osteoarthritis, right knee: Secondary | ICD-10-CM | POA: Diagnosis not present

## 2020-06-14 DIAGNOSIS — Z96651 Presence of right artificial knee joint: Secondary | ICD-10-CM | POA: Diagnosis not present

## 2020-06-15 DIAGNOSIS — M1711 Unilateral primary osteoarthritis, right knee: Secondary | ICD-10-CM | POA: Diagnosis not present

## 2020-06-18 DIAGNOSIS — Z6820 Body mass index (BMI) 20.0-20.9, adult: Secondary | ICD-10-CM | POA: Diagnosis not present

## 2020-06-18 DIAGNOSIS — N959 Unspecified menopausal and perimenopausal disorder: Secondary | ICD-10-CM | POA: Diagnosis not present

## 2020-06-18 DIAGNOSIS — Z01419 Encounter for gynecological examination (general) (routine) without abnormal findings: Secondary | ICD-10-CM | POA: Diagnosis not present

## 2020-06-20 DIAGNOSIS — M1711 Unilateral primary osteoarthritis, right knee: Secondary | ICD-10-CM | POA: Diagnosis not present

## 2020-07-16 DIAGNOSIS — M810 Age-related osteoporosis without current pathological fracture: Secondary | ICD-10-CM | POA: Diagnosis not present

## 2020-07-16 DIAGNOSIS — D692 Other nonthrombocytopenic purpura: Secondary | ICD-10-CM | POA: Diagnosis not present

## 2020-07-16 DIAGNOSIS — Z0000 Encounter for general adult medical examination without abnormal findings: Secondary | ICD-10-CM | POA: Diagnosis not present

## 2020-07-16 DIAGNOSIS — M8580 Other specified disorders of bone density and structure, unspecified site: Secondary | ICD-10-CM | POA: Diagnosis not present

## 2020-07-16 DIAGNOSIS — N390 Urinary tract infection, site not specified: Secondary | ICD-10-CM | POA: Diagnosis not present

## 2020-07-18 DIAGNOSIS — H02831 Dermatochalasis of right upper eyelid: Secondary | ICD-10-CM | POA: Diagnosis not present

## 2020-07-18 DIAGNOSIS — H5202 Hypermetropia, left eye: Secondary | ICD-10-CM | POA: Diagnosis not present

## 2020-07-18 DIAGNOSIS — Z83511 Family history of glaucoma: Secondary | ICD-10-CM | POA: Diagnosis not present

## 2020-07-18 DIAGNOSIS — H524 Presbyopia: Secondary | ICD-10-CM | POA: Diagnosis not present

## 2020-07-18 DIAGNOSIS — H35363 Drusen (degenerative) of macula, bilateral: Secondary | ICD-10-CM | POA: Diagnosis not present

## 2020-07-18 DIAGNOSIS — H40013 Open angle with borderline findings, low risk, bilateral: Secondary | ICD-10-CM | POA: Diagnosis not present

## 2020-07-18 DIAGNOSIS — H04123 Dry eye syndrome of bilateral lacrimal glands: Secondary | ICD-10-CM | POA: Diagnosis not present

## 2020-07-18 DIAGNOSIS — H11153 Pinguecula, bilateral: Secondary | ICD-10-CM | POA: Diagnosis not present

## 2020-07-18 DIAGNOSIS — H02834 Dermatochalasis of left upper eyelid: Secondary | ICD-10-CM | POA: Diagnosis not present

## 2020-07-18 DIAGNOSIS — H52203 Unspecified astigmatism, bilateral: Secondary | ICD-10-CM | POA: Diagnosis not present

## 2020-07-23 DIAGNOSIS — I739 Peripheral vascular disease, unspecified: Secondary | ICD-10-CM | POA: Diagnosis not present

## 2020-07-23 DIAGNOSIS — E782 Mixed hyperlipidemia: Secondary | ICD-10-CM | POA: Diagnosis not present

## 2020-07-23 DIAGNOSIS — M810 Age-related osteoporosis without current pathological fracture: Secondary | ICD-10-CM | POA: Diagnosis not present

## 2020-07-23 DIAGNOSIS — D692 Other nonthrombocytopenic purpura: Secondary | ICD-10-CM | POA: Diagnosis not present

## 2020-07-23 DIAGNOSIS — Z0000 Encounter for general adult medical examination without abnormal findings: Secondary | ICD-10-CM | POA: Diagnosis not present

## 2020-07-23 DIAGNOSIS — I10 Essential (primary) hypertension: Secondary | ICD-10-CM | POA: Diagnosis not present

## 2020-07-23 DIAGNOSIS — N182 Chronic kidney disease, stage 2 (mild): Secondary | ICD-10-CM | POA: Diagnosis not present

## 2020-08-05 ENCOUNTER — Emergency Department (HOSPITAL_COMMUNITY): Payer: PPO

## 2020-08-05 ENCOUNTER — Encounter (HOSPITAL_COMMUNITY): Payer: Self-pay

## 2020-08-05 ENCOUNTER — Other Ambulatory Visit: Payer: Self-pay

## 2020-08-05 ENCOUNTER — Emergency Department (HOSPITAL_COMMUNITY)
Admission: EM | Admit: 2020-08-05 | Discharge: 2020-08-05 | Disposition: A | Discharge status: Home / Self Care | Payer: PPO | Attending: Emergency Medicine | Admitting: Emergency Medicine

## 2020-08-05 DIAGNOSIS — L03115 Cellulitis of right lower limb: Secondary | ICD-10-CM

## 2020-08-05 DIAGNOSIS — Z79899 Other long term (current) drug therapy: Secondary | ICD-10-CM | POA: Diagnosis not present

## 2020-08-05 DIAGNOSIS — M19071 Primary osteoarthritis, right ankle and foot: Secondary | ICD-10-CM | POA: Diagnosis not present

## 2020-08-05 DIAGNOSIS — J45909 Unspecified asthma, uncomplicated: Secondary | ICD-10-CM | POA: Diagnosis not present

## 2020-08-05 DIAGNOSIS — Z96651 Presence of right artificial knee joint: Secondary | ICD-10-CM | POA: Insufficient documentation

## 2020-08-05 DIAGNOSIS — Z96642 Presence of left artificial hip joint: Secondary | ICD-10-CM | POA: Insufficient documentation

## 2020-08-05 DIAGNOSIS — M2011 Hallux valgus (acquired), right foot: Secondary | ICD-10-CM | POA: Diagnosis not present

## 2020-08-05 DIAGNOSIS — I10 Essential (primary) hypertension: Secondary | ICD-10-CM | POA: Insufficient documentation

## 2020-08-05 DIAGNOSIS — L02415 Cutaneous abscess of right lower limb: Secondary | ICD-10-CM | POA: Insufficient documentation

## 2020-08-05 DIAGNOSIS — M79671 Pain in right foot: Secondary | ICD-10-CM | POA: Diagnosis present

## 2020-08-05 MED ORDER — ACETAMINOPHEN 500 MG PO TABS
1000.0000 mg | ORAL_TABLET | Freq: Once | ORAL | Status: AC
  Administered 2020-08-05: 1000 mg via ORAL
  Filled 2020-08-05: qty 2

## 2020-08-05 MED ORDER — DOXYCYCLINE HYCLATE 100 MG PO CAPS
100.0000 mg | ORAL_CAPSULE | Freq: Two times a day (BID) | ORAL | 0 refills | Status: DC
Start: 2020-08-05 — End: 2022-10-04

## 2020-08-05 MED ORDER — LIDOCAINE 5 % EX PTCH
1.0000 | MEDICATED_PATCH | CUTANEOUS | Status: DC
  Administered 2020-08-05: 1 via TRANSDERMAL
  Filled 2020-08-05: qty 1

## 2020-08-05 MED ORDER — DOXYCYCLINE HYCLATE 100 MG PO TABS
100.0000 mg | ORAL_TABLET | Freq: Once | ORAL | Status: AC
  Administered 2020-08-05: 100 mg via ORAL
  Filled 2020-08-05: qty 1

## 2020-08-05 NOTE — ED Triage Notes (Signed)
Pt reports non traumatic right foot pain. Pt ambulatory at hospital to see support person. States left foot began hurting worse after pushing a trash tote to the road. While visiting support person became unable to ambulate. Hx right knee replacement.

## 2020-08-05 NOTE — ED Provider Notes (Signed)
[facility] DEPT Provider Note   CSN: [id number] Arrival date & time: 08/05/20  1948     History Chief Complaint  Patient presents with  . Foot Pain    Heather Fitzgerald is a 86 y.o. female.  HPI Patient is a 86 year old female with a medical history as noted below.  She presents today due to atraumatic right foot pain.  She reports worsening pain and swelling in the right foot moving proximally up to the right mid calf.  Pain worsens with ambulation.  She states that her pain has been worsening all day and now she feels as if she cannot ambulate due to the worsening pain.  States it is worst along the plantar aspect of the right foot.  No numbness or weakness.  History of right knee arthroplasty in June of this year.  Feels that she has been having more issues with the right foot and ankle since the surgery.  History of severe hallux valgus with toe deformities on both feet.  No chest pain or shortness of breath.  She is not anticoagulated.  No history of blood clots.    Past Medical History:  Diagnosis Date  . Anxiety   . Arthritis   . Asthma    hx of   . Cancer [facility])    face pre cancerous lesion removed  . GERD (gastroesophageal reflux disease)   . Headache(784.0)   . Hypertension   . Thrombocytopenia [facility])     Patient Active Problem List   Diagnosis Date Noted  . Right knee OA 01/26/2020  . Status post total right knee replacement 01/26/2020  . Digital mucous cyst 01/21/2018  . Postoperative anemia due to acute blood loss 03/29/2014  . S/P left hip conversion 03/27/2014  . Constipation 05/17/2013  . Acute blood loss anemia 05/17/2013  . Pain in left hip 05/17/2013  . Essential hypertension, benign 05/13/2013  . Thrombocytopenia, unspecified ([place]) 05/13/2013  . GERD (gastroesophageal reflux disease) 05/13/2013  . Intertrochanteric fracture of left hip ([place]) 05/12/2013    Past Surgical History:  Procedure Laterality Date  .  ABDOMINAL HYSTERECTOMY    . ANKLE FUSION Left   . CONVERSION TO TOTAL HIP Left 03/27/2014   Procedure: CONVERSION OF FAILED LEFT HIP HEMI TO TOTAL HIP ARTHROPLASTY;  Surgeon: Mauri Pole, MD;  Location: [facility];  Service: Orthopedics;  Laterality: Left;  . EYE SURGERY     bilateral cataract surgery   . GANGLION CYST EXCISION     right knee   . HIP ARTHROPLASTY Left 05/13/2013   Procedure: ARTHROPLASTY BIPOLAR HIP;  Surgeon: Augustin Schooling, MD;  Location: [facility];  Service: Orthopedics;  Laterality: Left;  . knee cyst surgery     . left ankle surgery      due to fracture   . OVARIAN CYST SURGERY    . TOTAL KNEE ARTHROPLASTY Right 01/26/2020   Procedure: TOTAL KNEE ARTHROPLASTY;  Surgeon: Paralee Cancel, MD;  Location: [facility];  Service: Orthopedics;  Laterality: Right;  70 mins     OB History   No obstetric history on file.     No family history on file.  Social History   Tobacco Use  . Smoking status: Never Smoker  . Smokeless tobacco: Never Used  Vaping Use  . Vaping Use: Never used  Substance Use Topics  . Alcohol use: No  . Drug use: No    Home Medications Prior to Admission medications   Medication Sig Start Date  End Date Taking? Authorizing Provider  albuterol (VENTOLIN HFA) 108 (90 Base) MCG/ACT inhaler Inhale 1 puff into the lungs every 6 (six) hours as needed for wheezing. 12/26/19  Yes [provider]  ALPRAZolam Duanne Moron) 0.25 MG tablet Take 0.25 mg by mouth daily as needed for anxiety. 03/08/20  Yes [provider]  amLODipine (NORVASC) 2.5 MG tablet Take 2.5 mg by mouth daily with breakfast.    Yes [provider]  estradiol (ESTRACE) 1 MG tablet Take 1 mg by mouth daily. 06/18/20  Yes [provider]  hydroxypropyl methylcellulose / hypromellose (ISOPTO TEARS / GONIOVISC) 2.5 % ophthalmic solution Place 1 drop into both eyes daily as needed for dry eyes.   Yes [provider]  ibuprofen (ADVIL) 100 MG/5ML suspension Take  200 mg by mouth every 8 (eight) hours as needed for moderate pain.   Yes [provider]  Naproxen Sodium 220 MG CAPS Take 220 mg by mouth daily as needed (pain).   Yes [provider]  nystatin-triamcinolone (MYCOLOG II) cream Apply 1 application topically daily. 07/13/20  Yes [provider]  pantoprazole (PROTONIX) 40 MG tablet Take 40 mg by mouth daily as needed (acid reflux).   Yes [provider]  polyethylene glycol (MIRALAX / GLYCOLAX) 17 g packet Take 17 g by mouth 2 (two) times daily. 01/27/20  Yes Babish, Rodman Key, PA-C  docusate sodium (COLACE) 100 MG capsule Take 1 capsule (100 mg total) by mouth 2 (two) times daily. Patient not taking: No sig reported 01/27/20   Danae Orleans, PA-C  ferrous sulfate (FERROUSUL) 325 (65 FE) MG tablet Take 1 tablet (325 mg total) by mouth 3 (three) times daily with meals for 14 days. Patient not taking: Reported on 08/05/2020 01/27/20 02/10/20  Danae Orleans, PA-C  HYDROcodone-acetaminophen (NORCO) 5-325 MG tablet Take 1-2 tablets by mouth every 6 (six) hours as needed for moderate pain or severe pain. Patient not taking: No sig reported 01/27/20   Danae Orleans, PA-C  methocarbamol (ROBAXIN) 500 MG tablet Take 1 tablet (500 mg total) by mouth every 6 (six) hours as needed for muscle spasms. Patient not taking: No sig reported 01/27/20   Danae Orleans, PA-C  ondansetron (ZOFRAN) 4 MG tablet Take 1 tablet (4 mg total) by mouth every 8 (eight) hours as needed for nausea or vomiting. Patient not taking: No sig reported 01/27/20   Danae Orleans, PA-C    Allergies    Iohexol, Sulfa antibiotics, and Losartan  Review of Systems   Review of Systems  All other systems reviewed and are negative. Ten systems reviewed and are negative for acute change, except as noted in the HPI.    Physical Exam Updated Vital Signs BP (!) 157/98 (BP Location: Left Arm)   Pulse 70   Temp 98.2 F (36.8 C) (Oral)   Resp 16   SpO2 100%    Physical Exam Vitals and nursing note reviewed.  Constitutional:      General: She is not in acute distress.    Appearance: Normal appearance. She is not ill-appearing, toxic-appearing or diaphoretic.  HENT:     Head: Normocephalic and atraumatic.     Right Ear: External ear normal.     Left Ear: External ear normal.     Nose: Nose normal.     Mouth/Throat:     Mouth: Mucous membranes are moist.     Pharynx: Oropharynx is clear. No oropharyngeal exudate or posterior oropharyngeal erythema.  Eyes:     Extraocular Movements: Extraocular  movements intact.  Cardiovascular:     Rate and Rhythm: Normal rate and regular rhythm.     Pulses: Normal pulses.     Heart sounds: Normal heart sounds. No murmur heard. No friction rub. No gallop.   Pulmonary:     Effort: Pulmonary effort is normal. No respiratory distress.     Breath sounds: Normal breath sounds. No stridor. No wheezing, rhonchi or rales.  Abdominal:     General: Abdomen is flat.     Palpations: Abdomen is soft.     Tenderness: There is no abdominal tenderness.  Musculoskeletal:        General: Swelling, tenderness and deformity present. Normal range of motion.     Cervical back: Normal range of motion and neck supple. No tenderness.     Comments: Moderate tenderness noted along the plantar aspect of the right foot as well as the medial aspect of the right foot.  1+ edema in the right foot moving proximally to the mid calf.  No significant edema noted in the left foot or leg.  Distal sensation intact.  Severe hallux valgus deformity noted along both feet with multiple toe deformities.  Palpable pedal pulses.  Mild increase in warmth noted to the right foot compared to the left.  Skin:    General: Skin is warm and dry.     Findings: Erythema present.  Neurological:     General: No focal deficit present.     Mental Status: She is alert and oriented to person, place, and time.  Psychiatric:        Mood and Affect: Mood normal.         Behavior: Behavior normal.     ED Results / Procedures / Treatments   Labs (all labs ordered are listed, but only abnormal results are displayed) Labs Reviewed - No data to display  EKG None  Radiology DG Foot Complete Right  Result Date: 08/05/2020 CLINICAL DATA:  Foot pain. EXAM: RIGHT FOOT COMPLETE - 3+ VIEW COMPARISON:  June 28, 2018 FINDINGS: There is severe hallux valgus. There are advanced degenerative changes of the first metatarsophalangeal joint. 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 There is an old healed fracture of the second metatarsal. There is no acute displaced fracture. Multiple hammertoe deformities are noted. IMPRESSION: 1. No acute osseous abnormality. 2. Chronic changes are noted of the right foot. Electronically Signed   By: Constance Holster M.D.   On: 08/05/2020 21:07    Procedures Procedures (including critical care time)  Medications Ordered in ED Medications  lidocaine (LIDODERM) 5 % 1 patch (1 patch Transdermal Patch Applied 08/05/20 2309)  acetaminophen (TYLENOL) tablet 1,000 mg (1,000 mg Oral Given 08/05/20 2309)  doxycycline (VIBRA-TABS) tablet 100 mg (100 mg Oral Given 08/05/20 2309)   ED Course  I have reviewed the triage vital signs and the nursing notes.  Pertinent labs & imaging results that were available during my care of the patient were reviewed by me and considered in my medical decision making (see chart for details).  Clinical Course as of 08/05/20 2319  Nancy Fetter Aug 05, 2020  2227 DG Foot Complete Right IMPRESSION: 1. No acute osseous abnormality. 2. Chronic changes are noted of the right foot. [LJ]    Clinical Course User Index [LJ] Rayna Sexton, PA-C   MDM Rules/Calculators/A&P                          Patient  is a 86 year old female with history of severe hallux valgus deformity.  Has been experiencing worsening atraumatic right foot pain over the course the day.  Mild swelling  and erythema noted to the right foot.  Appears to be a developing cellulitis to the right foot.  No calf pain or calf swelling.  Patient discussed with and evaluated by my attending physician Dr. Gareth Morgan.  She agrees that this appears to be cellulitis.  Will discharge patient on a course of doxycycline.  PCP follow-up.  Return to the ER with new or worsening symptoms.  Her questions were answered and she was amicable at the time of discharge.  Final Clinical Impression(s) / ED Diagnoses Final diagnoses:  Cellulitis of right foot    Rx / DC Orders ED Discharge Orders         Ordered    doxycycline (VIBRAMYCIN) 100 MG capsule  2 times daily        08/05/20 2302           Rayna Sexton, PA-C 08/05/20 2320    Gareth Morgan, MD 08/06/20 2338

## 2020-08-21 DIAGNOSIS — M810 Age-related osteoporosis without current pathological fracture: Secondary | ICD-10-CM | POA: Diagnosis not present

## 2020-09-20 DIAGNOSIS — U071 COVID-19: Secondary | ICD-10-CM | POA: Diagnosis not present

## 2020-09-20 DIAGNOSIS — Z20828 Contact with and (suspected) exposure to other viral communicable diseases: Secondary | ICD-10-CM | POA: Diagnosis not present

## 2020-09-21 ENCOUNTER — Other Ambulatory Visit (HOSPITAL_COMMUNITY): Payer: Self-pay | Admitting: Internal Medicine

## 2020-10-08 DIAGNOSIS — M25561 Pain in right knee: Secondary | ICD-10-CM | POA: Diagnosis not present

## 2020-10-08 DIAGNOSIS — Z96651 Presence of right artificial knee joint: Secondary | ICD-10-CM | POA: Diagnosis not present

## 2020-10-18 DIAGNOSIS — Z1231 Encounter for screening mammogram for malignant neoplasm of breast: Secondary | ICD-10-CM | POA: Diagnosis not present

## 2020-10-29 DIAGNOSIS — N760 Acute vaginitis: Secondary | ICD-10-CM | POA: Diagnosis not present

## 2020-10-29 DIAGNOSIS — N952 Postmenopausal atrophic vaginitis: Secondary | ICD-10-CM | POA: Diagnosis not present

## 2020-11-26 DIAGNOSIS — N952 Postmenopausal atrophic vaginitis: Secondary | ICD-10-CM | POA: Diagnosis not present

## 2020-12-27 IMAGING — CR DG FOOT COMPLETE 3+V*R*
3 series · 3 of 3 positions shown · non-contrast
Comparison: June 28, 2018

CLINICAL DATA: Foot pain.

EXAM:
RIGHT FOOT COMPLETE - 3+ VIEW

[t foot ap right]
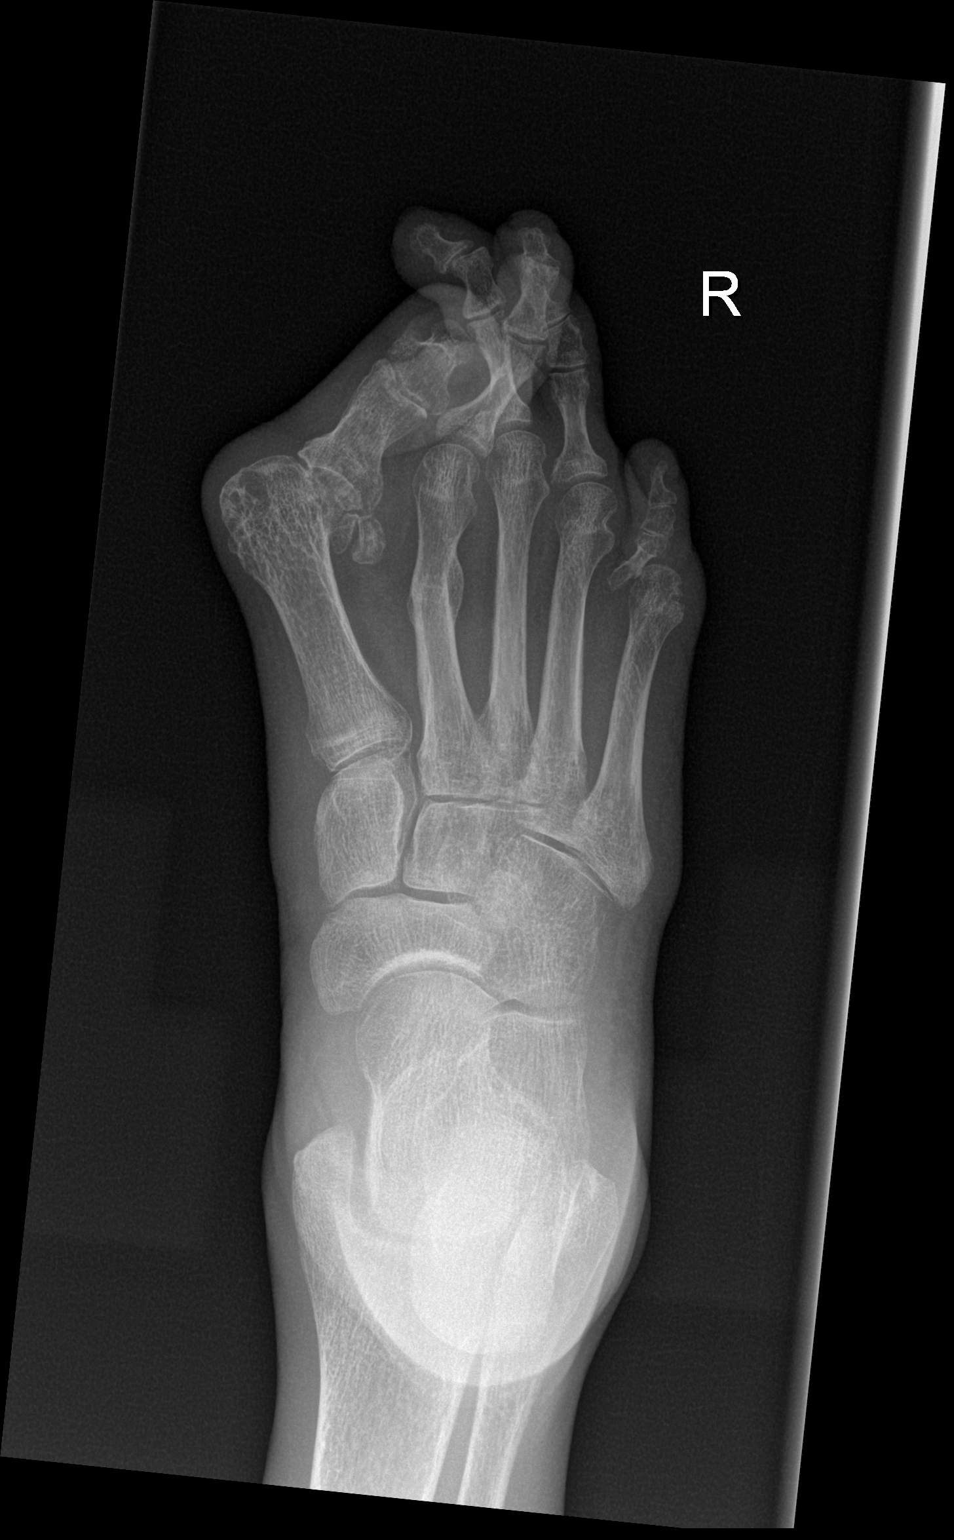

[t foot obl right]
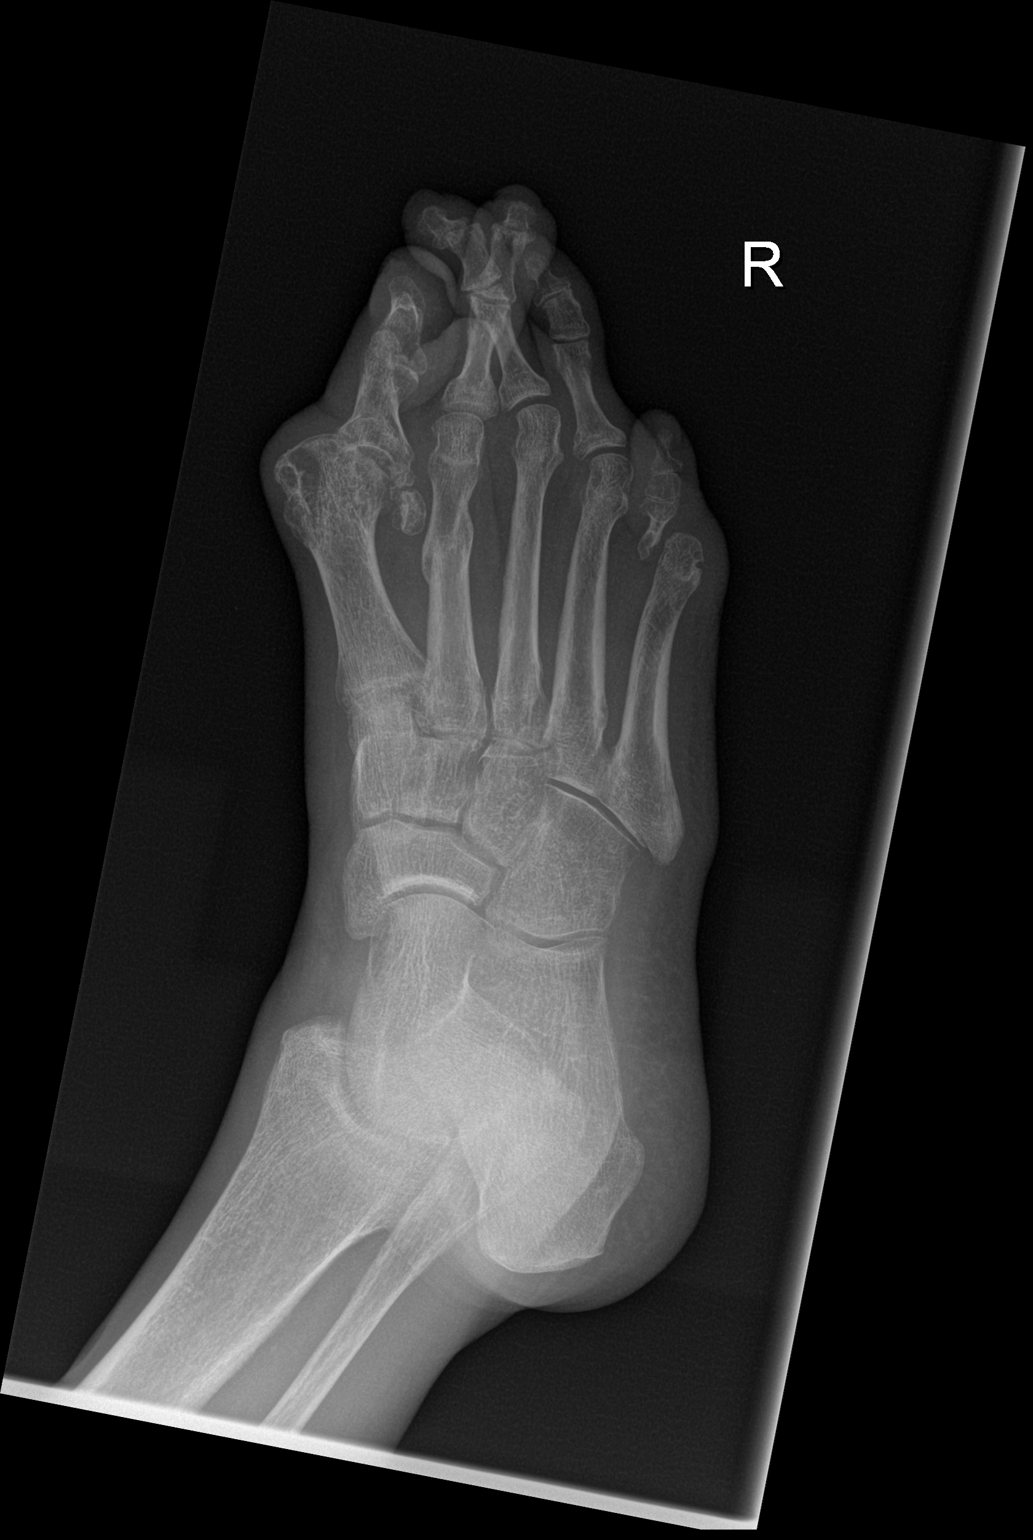

[t foot lat right]
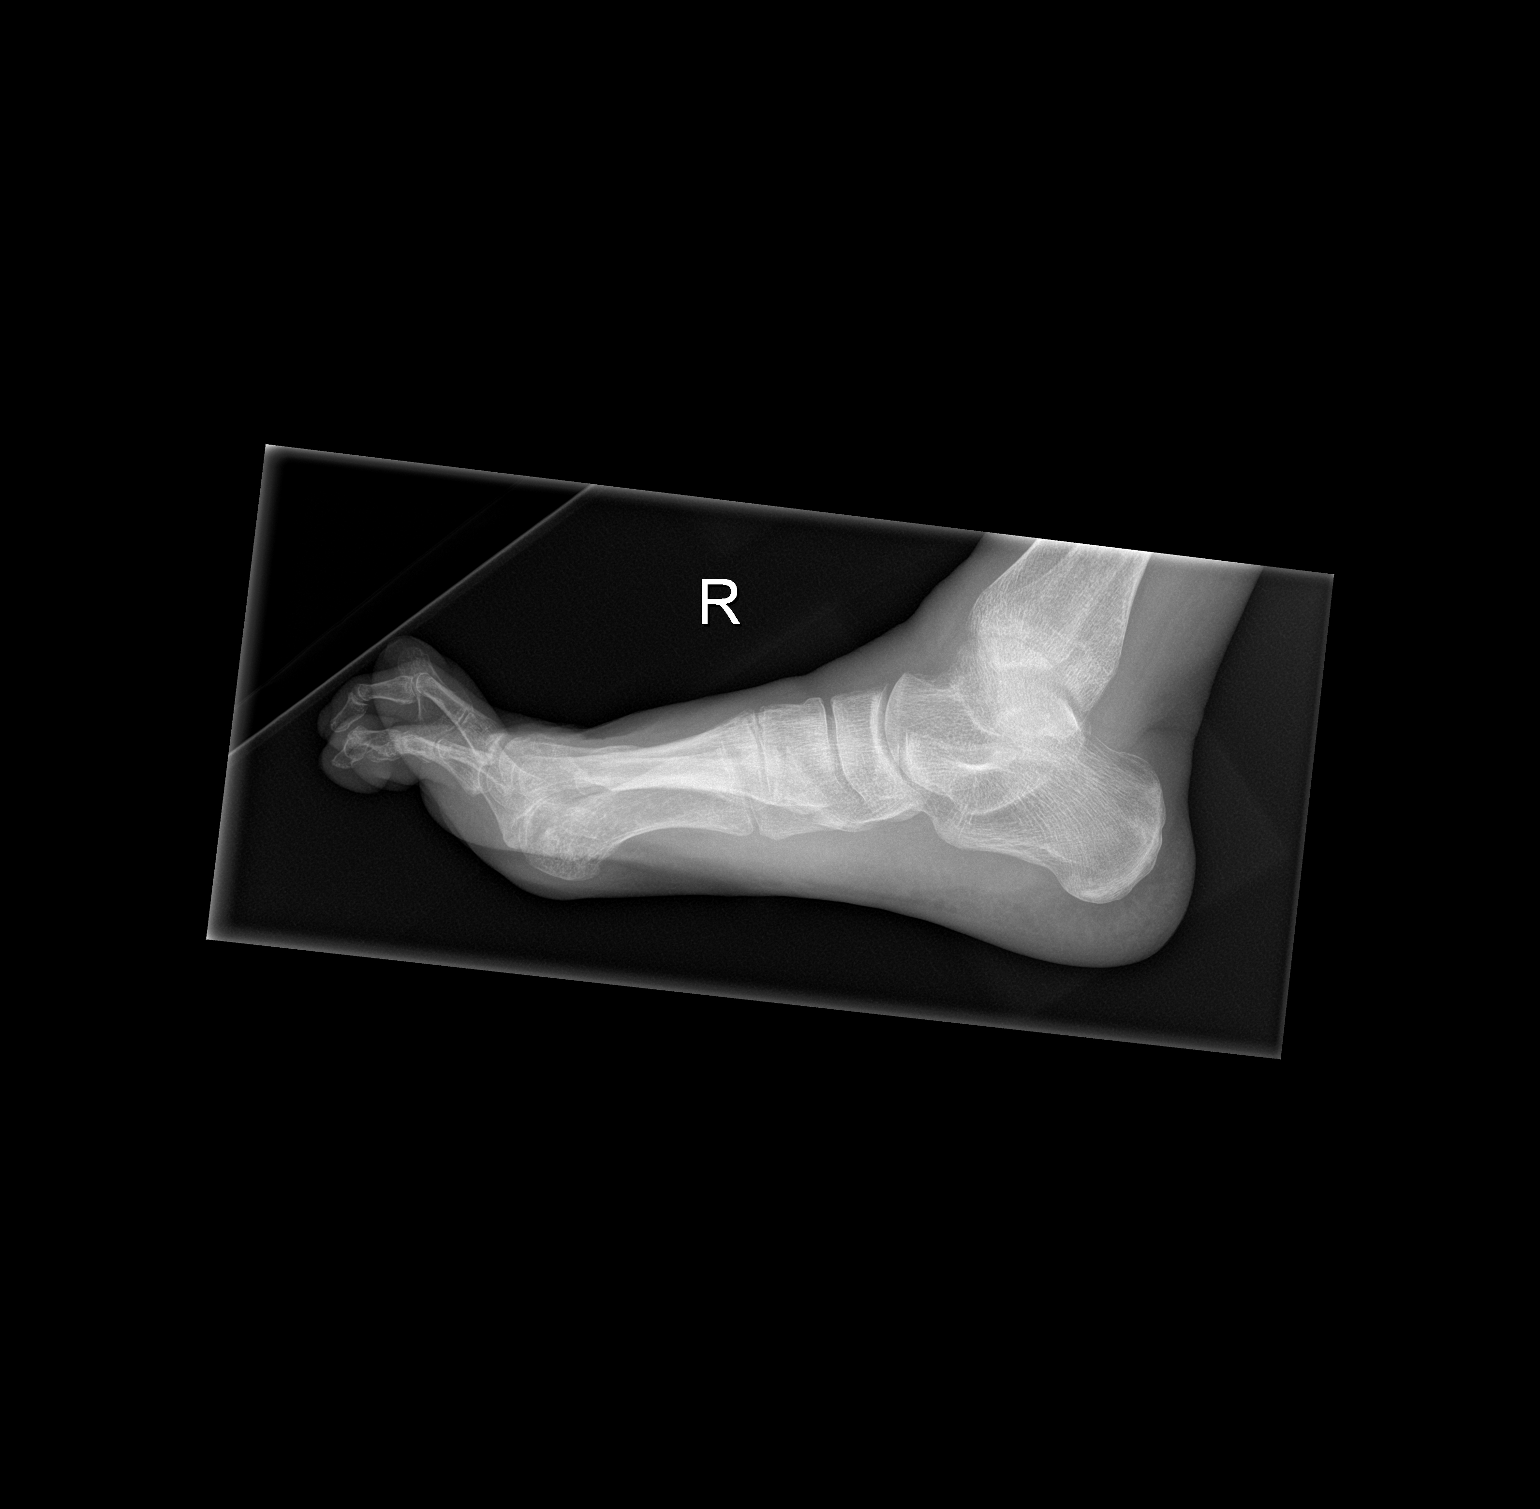

[3 of 3 positions shown; findings below may reference images not displayed]

FINDINGS: There is severe hallux valgus. There are advanced degenerative
changes of the first metatarsophalangeal joint. There is a chronic
dislocation at the fifth metatarsal phalangeal joint. There is an
old healed fracture of the second metatarsal. There is no acute
displaced fracture. Multiple hammertoe deformities are noted.
IMPRESSION: 1. No acute osseous abnormality.
2. Chronic changes are noted of the right foot.

## 2021-01-08 DIAGNOSIS — N952 Postmenopausal atrophic vaginitis: Secondary | ICD-10-CM | POA: Diagnosis not present

## 2021-01-21 DIAGNOSIS — E782 Mixed hyperlipidemia: Secondary | ICD-10-CM | POA: Diagnosis not present

## 2021-01-28 DIAGNOSIS — I739 Peripheral vascular disease, unspecified: Secondary | ICD-10-CM | POA: Diagnosis not present

## 2021-01-28 DIAGNOSIS — N182 Chronic kidney disease, stage 2 (mild): Secondary | ICD-10-CM | POA: Diagnosis not present

## 2021-01-28 DIAGNOSIS — I10 Essential (primary) hypertension: Secondary | ICD-10-CM | POA: Diagnosis not present

## 2021-01-28 DIAGNOSIS — M810 Age-related osteoporosis without current pathological fracture: Secondary | ICD-10-CM | POA: Diagnosis not present

## 2021-01-28 DIAGNOSIS — E782 Mixed hyperlipidemia: Secondary | ICD-10-CM | POA: Diagnosis not present

## 2021-01-28 DIAGNOSIS — D692 Other nonthrombocytopenic purpura: Secondary | ICD-10-CM | POA: Diagnosis not present

## 2021-01-31 DIAGNOSIS — Z96651 Presence of right artificial knee joint: Secondary | ICD-10-CM | POA: Diagnosis not present

## 2021-02-07 DIAGNOSIS — H04123 Dry eye syndrome of bilateral lacrimal glands: Secondary | ICD-10-CM | POA: Diagnosis not present

## 2021-02-07 DIAGNOSIS — H5202 Hypermetropia, left eye: Secondary | ICD-10-CM | POA: Diagnosis not present

## 2021-02-07 DIAGNOSIS — H524 Presbyopia: Secondary | ICD-10-CM | POA: Diagnosis not present

## 2021-02-07 DIAGNOSIS — H11153 Pinguecula, bilateral: Secondary | ICD-10-CM | POA: Diagnosis not present

## 2021-02-07 DIAGNOSIS — Z83511 Family history of glaucoma: Secondary | ICD-10-CM | POA: Diagnosis not present

## 2021-02-07 DIAGNOSIS — H52203 Unspecified astigmatism, bilateral: Secondary | ICD-10-CM | POA: Diagnosis not present

## 2021-02-07 DIAGNOSIS — H40013 Open angle with borderline findings, low risk, bilateral: Secondary | ICD-10-CM | POA: Diagnosis not present

## 2021-02-22 DIAGNOSIS — N952 Postmenopausal atrophic vaginitis: Secondary | ICD-10-CM | POA: Diagnosis not present

## 2021-05-10 DIAGNOSIS — I129 Hypertensive chronic kidney disease with stage 1 through stage 4 chronic kidney disease, or unspecified chronic kidney disease: Secondary | ICD-10-CM | POA: Diagnosis not present

## 2021-05-10 DIAGNOSIS — M810 Age-related osteoporosis without current pathological fracture: Secondary | ICD-10-CM | POA: Diagnosis not present

## 2021-05-10 DIAGNOSIS — E782 Mixed hyperlipidemia: Secondary | ICD-10-CM | POA: Diagnosis not present

## 2021-05-10 DIAGNOSIS — N182 Chronic kidney disease, stage 2 (mild): Secondary | ICD-10-CM | POA: Diagnosis not present

## 2021-05-13 DIAGNOSIS — F419 Anxiety disorder, unspecified: Secondary | ICD-10-CM | POA: Diagnosis not present

## 2021-05-13 DIAGNOSIS — Z23 Encounter for immunization: Secondary | ICD-10-CM | POA: Diagnosis not present

## 2021-06-10 DIAGNOSIS — E782 Mixed hyperlipidemia: Secondary | ICD-10-CM | POA: Diagnosis not present

## 2021-06-10 DIAGNOSIS — M810 Age-related osteoporosis without current pathological fracture: Secondary | ICD-10-CM | POA: Diagnosis not present

## 2021-06-10 DIAGNOSIS — N182 Chronic kidney disease, stage 2 (mild): Secondary | ICD-10-CM | POA: Diagnosis not present

## 2021-06-10 DIAGNOSIS — I129 Hypertensive chronic kidney disease with stage 1 through stage 4 chronic kidney disease, or unspecified chronic kidney disease: Secondary | ICD-10-CM | POA: Diagnosis not present

## 2021-06-10 DIAGNOSIS — I10 Essential (primary) hypertension: Secondary | ICD-10-CM | POA: Diagnosis not present

## 2021-06-10 DIAGNOSIS — F419 Anxiety disorder, unspecified: Secondary | ICD-10-CM | POA: Diagnosis not present

## 2021-06-12 DIAGNOSIS — M25551 Pain in right hip: Secondary | ICD-10-CM | POA: Diagnosis not present

## 2021-08-21 DIAGNOSIS — M810 Age-related osteoporosis without current pathological fracture: Secondary | ICD-10-CM | POA: Diagnosis not present

## 2021-08-26 DIAGNOSIS — I10 Essential (primary) hypertension: Secondary | ICD-10-CM | POA: Diagnosis not present

## 2021-08-26 DIAGNOSIS — E782 Mixed hyperlipidemia: Secondary | ICD-10-CM | POA: Diagnosis not present

## 2021-08-26 DIAGNOSIS — N182 Chronic kidney disease, stage 2 (mild): Secondary | ICD-10-CM | POA: Diagnosis not present

## 2021-08-26 DIAGNOSIS — I739 Peripheral vascular disease, unspecified: Secondary | ICD-10-CM | POA: Diagnosis not present

## 2021-08-30 DIAGNOSIS — Z01419 Encounter for gynecological examination (general) (routine) without abnormal findings: Secondary | ICD-10-CM | POA: Diagnosis not present

## 2021-08-30 DIAGNOSIS — N959 Unspecified menopausal and perimenopausal disorder: Secondary | ICD-10-CM | POA: Diagnosis not present

## 2021-08-30 DIAGNOSIS — Z6822 Body mass index (BMI) 22.0-22.9, adult: Secondary | ICD-10-CM | POA: Diagnosis not present

## 2021-08-30 DIAGNOSIS — N952 Postmenopausal atrophic vaginitis: Secondary | ICD-10-CM | POA: Diagnosis not present

## 2021-09-02 DIAGNOSIS — K589 Irritable bowel syndrome without diarrhea: Secondary | ICD-10-CM | POA: Diagnosis not present

## 2021-09-02 DIAGNOSIS — F419 Anxiety disorder, unspecified: Secondary | ICD-10-CM | POA: Diagnosis not present

## 2021-09-02 DIAGNOSIS — E782 Mixed hyperlipidemia: Secondary | ICD-10-CM | POA: Diagnosis not present

## 2021-09-02 DIAGNOSIS — I10 Essential (primary) hypertension: Secondary | ICD-10-CM | POA: Diagnosis not present

## 2021-09-02 DIAGNOSIS — M5432 Sciatica, left side: Secondary | ICD-10-CM | POA: Diagnosis not present

## 2021-09-02 DIAGNOSIS — D692 Other nonthrombocytopenic purpura: Secondary | ICD-10-CM | POA: Diagnosis not present

## 2021-09-02 DIAGNOSIS — N182 Chronic kidney disease, stage 2 (mild): Secondary | ICD-10-CM | POA: Diagnosis not present

## 2021-09-02 DIAGNOSIS — I739 Peripheral vascular disease, unspecified: Secondary | ICD-10-CM | POA: Diagnosis not present

## 2021-09-02 DIAGNOSIS — I8312 Varicose veins of left lower extremity with inflammation: Secondary | ICD-10-CM | POA: Diagnosis not present

## 2021-09-02 DIAGNOSIS — Z0000 Encounter for general adult medical examination without abnormal findings: Secondary | ICD-10-CM | POA: Diagnosis not present

## 2021-10-14 DIAGNOSIS — M25552 Pain in left hip: Secondary | ICD-10-CM | POA: Diagnosis not present

## 2021-11-04 DIAGNOSIS — Z1231 Encounter for screening mammogram for malignant neoplasm of breast: Secondary | ICD-10-CM | POA: Diagnosis not present

## 2021-11-15 DIAGNOSIS — M25552 Pain in left hip: Secondary | ICD-10-CM | POA: Diagnosis not present

## 2021-11-15 DIAGNOSIS — Z96642 Presence of left artificial hip joint: Secondary | ICD-10-CM | POA: Diagnosis not present

## 2021-11-15 DIAGNOSIS — M7062 Trochanteric bursitis, left hip: Secondary | ICD-10-CM | POA: Diagnosis not present

## 2021-11-22 DIAGNOSIS — M1990 Unspecified osteoarthritis, unspecified site: Secondary | ICD-10-CM | POA: Diagnosis not present

## 2021-11-22 DIAGNOSIS — K219 Gastro-esophageal reflux disease without esophagitis: Secondary | ICD-10-CM | POA: Diagnosis not present

## 2021-11-22 DIAGNOSIS — F419 Anxiety disorder, unspecified: Secondary | ICD-10-CM | POA: Diagnosis not present

## 2021-11-22 DIAGNOSIS — K589 Irritable bowel syndrome without diarrhea: Secondary | ICD-10-CM | POA: Diagnosis not present

## 2021-11-22 DIAGNOSIS — G8929 Other chronic pain: Secondary | ICD-10-CM | POA: Diagnosis not present

## 2021-11-22 DIAGNOSIS — M8580 Other specified disorders of bone density and structure, unspecified site: Secondary | ICD-10-CM | POA: Diagnosis not present

## 2021-11-22 DIAGNOSIS — F3342 Major depressive disorder, recurrent, in full remission: Secondary | ICD-10-CM | POA: Diagnosis not present

## 2021-11-22 DIAGNOSIS — H9193 Unspecified hearing loss, bilateral: Secondary | ICD-10-CM | POA: Diagnosis not present

## 2021-11-22 DIAGNOSIS — I739 Peripheral vascular disease, unspecified: Secondary | ICD-10-CM | POA: Diagnosis not present

## 2021-11-22 DIAGNOSIS — I10 Essential (primary) hypertension: Secondary | ICD-10-CM | POA: Diagnosis not present

## 2021-11-22 DIAGNOSIS — E785 Hyperlipidemia, unspecified: Secondary | ICD-10-CM | POA: Diagnosis not present

## 2021-11-22 DIAGNOSIS — K5900 Constipation, unspecified: Secondary | ICD-10-CM | POA: Diagnosis not present

## 2021-12-24 DIAGNOSIS — J209 Acute bronchitis, unspecified: Secondary | ICD-10-CM | POA: Diagnosis not present

## 2021-12-24 DIAGNOSIS — Z20822 Contact with and (suspected) exposure to covid-19: Secondary | ICD-10-CM | POA: Diagnosis not present

## 2022-02-05 DIAGNOSIS — M7062 Trochanteric bursitis, left hip: Secondary | ICD-10-CM | POA: Diagnosis not present

## 2022-02-10 DIAGNOSIS — N182 Chronic kidney disease, stage 2 (mild): Secondary | ICD-10-CM | POA: Diagnosis not present

## 2022-02-10 DIAGNOSIS — I739 Peripheral vascular disease, unspecified: Secondary | ICD-10-CM | POA: Diagnosis not present

## 2022-02-10 DIAGNOSIS — I8312 Varicose veins of left lower extremity with inflammation: Secondary | ICD-10-CM | POA: Diagnosis not present

## 2022-02-10 DIAGNOSIS — E782 Mixed hyperlipidemia: Secondary | ICD-10-CM | POA: Diagnosis not present

## 2022-02-10 DIAGNOSIS — I10 Essential (primary) hypertension: Secondary | ICD-10-CM | POA: Diagnosis not present

## 2022-02-10 DIAGNOSIS — D692 Other nonthrombocytopenic purpura: Secondary | ICD-10-CM | POA: Diagnosis not present

## 2022-02-10 DIAGNOSIS — K589 Irritable bowel syndrome without diarrhea: Secondary | ICD-10-CM | POA: Diagnosis not present

## 2022-02-17 DIAGNOSIS — N182 Chronic kidney disease, stage 2 (mild): Secondary | ICD-10-CM | POA: Diagnosis not present

## 2022-02-17 DIAGNOSIS — I8312 Varicose veins of left lower extremity with inflammation: Secondary | ICD-10-CM | POA: Diagnosis not present

## 2022-02-17 DIAGNOSIS — E782 Mixed hyperlipidemia: Secondary | ICD-10-CM | POA: Diagnosis not present

## 2022-02-17 DIAGNOSIS — D692 Other nonthrombocytopenic purpura: Secondary | ICD-10-CM | POA: Diagnosis not present

## 2022-02-17 DIAGNOSIS — K589 Irritable bowel syndrome without diarrhea: Secondary | ICD-10-CM | POA: Diagnosis not present

## 2022-02-17 DIAGNOSIS — M5432 Sciatica, left side: Secondary | ICD-10-CM | POA: Diagnosis not present

## 2022-02-17 DIAGNOSIS — R252 Cramp and spasm: Secondary | ICD-10-CM | POA: Diagnosis not present

## 2022-02-17 DIAGNOSIS — I10 Essential (primary) hypertension: Secondary | ICD-10-CM | POA: Diagnosis not present

## 2022-02-17 DIAGNOSIS — F419 Anxiety disorder, unspecified: Secondary | ICD-10-CM | POA: Diagnosis not present

## 2022-02-17 DIAGNOSIS — I739 Peripheral vascular disease, unspecified: Secondary | ICD-10-CM | POA: Diagnosis not present

## 2022-02-17 DIAGNOSIS — R197 Diarrhea, unspecified: Secondary | ICD-10-CM | POA: Diagnosis not present

## 2022-04-15 DIAGNOSIS — E782 Mixed hyperlipidemia: Secondary | ICD-10-CM | POA: Diagnosis not present

## 2022-04-15 DIAGNOSIS — I10 Essential (primary) hypertension: Secondary | ICD-10-CM | POA: Diagnosis not present

## 2022-04-21 DIAGNOSIS — D692 Other nonthrombocytopenic purpura: Secondary | ICD-10-CM | POA: Diagnosis not present

## 2022-04-21 DIAGNOSIS — M5432 Sciatica, left side: Secondary | ICD-10-CM | POA: Diagnosis not present

## 2022-04-21 DIAGNOSIS — K589 Irritable bowel syndrome without diarrhea: Secondary | ICD-10-CM | POA: Diagnosis not present

## 2022-04-21 DIAGNOSIS — I8312 Varicose veins of left lower extremity with inflammation: Secondary | ICD-10-CM | POA: Diagnosis not present

## 2022-04-21 DIAGNOSIS — I739 Peripheral vascular disease, unspecified: Secondary | ICD-10-CM | POA: Diagnosis not present

## 2022-04-21 DIAGNOSIS — F419 Anxiety disorder, unspecified: Secondary | ICD-10-CM | POA: Diagnosis not present

## 2022-04-21 DIAGNOSIS — I10 Essential (primary) hypertension: Secondary | ICD-10-CM | POA: Diagnosis not present

## 2022-04-21 DIAGNOSIS — N182 Chronic kidney disease, stage 2 (mild): Secondary | ICD-10-CM | POA: Diagnosis not present

## 2022-04-21 DIAGNOSIS — E782 Mixed hyperlipidemia: Secondary | ICD-10-CM | POA: Diagnosis not present

## 2022-05-08 DIAGNOSIS — Z23 Encounter for immunization: Secondary | ICD-10-CM | POA: Diagnosis not present

## 2022-08-28 DIAGNOSIS — M810 Age-related osteoporosis without current pathological fracture: Secondary | ICD-10-CM | POA: Diagnosis not present

## 2022-08-29 ENCOUNTER — Encounter (HOSPITAL_BASED_OUTPATIENT_CLINIC_OR_DEPARTMENT_OTHER): Payer: Self-pay

## 2022-08-29 ENCOUNTER — Emergency Department (HOSPITAL_BASED_OUTPATIENT_CLINIC_OR_DEPARTMENT_OTHER): Payer: PPO

## 2022-08-29 ENCOUNTER — Emergency Department (HOSPITAL_BASED_OUTPATIENT_CLINIC_OR_DEPARTMENT_OTHER)
Admission: EM | Admit: 2022-08-29 | Discharge: 2022-08-29 | Disposition: A | Discharge status: Home / Self Care | Payer: PPO | Attending: Emergency Medicine | Admitting: Emergency Medicine

## 2022-08-29 DIAGNOSIS — S199XXA Unspecified injury of neck, initial encounter: Secondary | ICD-10-CM | POA: Diagnosis not present

## 2022-08-29 DIAGNOSIS — J984 Other disorders of lung: Secondary | ICD-10-CM | POA: Diagnosis not present

## 2022-08-29 DIAGNOSIS — M4319 Spondylolisthesis, multiple sites in spine: Secondary | ICD-10-CM | POA: Diagnosis not present

## 2022-08-29 DIAGNOSIS — W19XXXA Unspecified fall, initial encounter: Secondary | ICD-10-CM | POA: Insufficient documentation

## 2022-08-29 DIAGNOSIS — M542 Cervicalgia: Secondary | ICD-10-CM | POA: Diagnosis not present

## 2022-08-29 DIAGNOSIS — S0990XA Unspecified injury of head, initial encounter: Secondary | ICD-10-CM | POA: Insufficient documentation

## 2022-08-29 DIAGNOSIS — Z043 Encounter for examination and observation following other accident: Secondary | ICD-10-CM | POA: Diagnosis not present

## 2022-08-29 MED ORDER — OXYCODONE HCL 5 MG PO TABS
2.5000 mg | ORAL_TABLET | Freq: Once | ORAL | Status: AC
  Administered 2022-08-29: 2.5 mg via ORAL
  Filled 2022-08-29: qty 1

## 2022-08-29 MED ORDER — OXYCODONE HCL 5 MG PO TABS: 2.5000 mg | ORAL_TABLET | ORAL | 0 refills | Status: DC | PRN

## 2022-08-29 MED ORDER — ACETAMINOPHEN 500 MG PO TABS
1000.0000 mg | ORAL_TABLET | Freq: Once | ORAL | Status: AC
  Administered 2022-08-29: 1000 mg via ORAL
  Filled 2022-08-29: qty 2

## 2022-08-29 NOTE — ED Notes (Signed)
Patient transported to X-ray 

## 2022-08-29 NOTE — ED Notes (Signed)
Pt stable at of discharge. RR even and unlabored. No acute distress noted. Pt verbalized understanding of discharge instructions as discussed. Pt ambulatory to lobby.

## 2022-08-29 NOTE — ED Provider Notes (Signed)
[facility] Provider Note   CSN: [id number] Arrival date & time: 08/29/22  1238     History Chief Complaint  Patient presents with   Neck Pain    HPI Heather Fitzgerald is a 89 y.o. female presenting for chief complaint of acute on chronic neck pain.  States that she fell yesterday.  She states that she was walking from the kitchen to her living room when she felt himself going over forward.  She says that her left arm and left neck hit a chair.  She has left-sided neck pain.  She denies fevers chills nausea vomiting shortness of breath.  Denies any neurologic changes.  Has full range of motion of the left arm.  She does endorse some pain of the left shoulder though states its mostly coming from the base of her neck.  Otherwise ambulatory tolerating p.o. intake. Patient's recorded medical, surgical, social, medication list and allergies were reviewed in the Snapshot window as part of the initial history.   Review of Systems   Review of Systems  Constitutional:  Negative for chills and fever.  HENT:  Negative for ear pain and sore throat.   Eyes:  Negative for pain and visual disturbance.  Respiratory:  Negative for cough and shortness of breath.   Cardiovascular:  Negative for chest pain and palpitations.  Gastrointestinal:  Negative for abdominal pain and vomiting.  Genitourinary:  Negative for dysuria and hematuria.  Musculoskeletal:  Positive for neck pain. Negative for arthralgias and back pain.  Skin:  Negative for color change and rash.  Neurological:  Negative for seizures and syncope.  All other systems reviewed and are negative.   Physical Exam Updated Vital Signs BP (!) 176/75 (BP Location: Right Arm)   Pulse 69   Temp 98.3 F (36.8 C) (Oral)   Resp 18   Ht '5\' 2"'$  (1.575 m)   Wt 51.3 kg   SpO2 100%   BMI 20.67 kg/m  Physical Exam Vitals and nursing note reviewed.  Constitutional:      General: She is not in  acute distress.    Appearance: She is well-developed.  HENT:     Head: Normocephalic and atraumatic.  Eyes:     Conjunctiva/sclera: Conjunctivae normal.  Cardiovascular:     Rate and Rhythm: Normal rate and regular rhythm.     Heart sounds: No murmur heard. Pulmonary:     Effort: Pulmonary effort is normal. No respiratory distress.     Breath sounds: Normal breath sounds.  Abdominal:     General: There is no distension.     Palpations: Abdomen is soft.     Tenderness: There is no abdominal tenderness. There is no right CVA tenderness or left CVA tenderness.  Musculoskeletal:        General: No swelling or tenderness (Point tenderness over the left trapezius into the left axilla.). Normal range of motion.     Cervical back: Neck supple. No tenderness.  Skin:    General: Skin is warm and dry.  Neurological:     General: No focal deficit present.     Mental Status: She is alert and oriented to person, place, and time. Mental status is at baseline.     Cranial Nerves: No cranial nerve deficit.      ED Course/ Medical Decision Making/ A&P    Procedures Procedures   Medications Ordered in ED Medications  oxyCODONE (Oxy IR/ROXICODONE) immediate release tablet 2.5 mg (2.5 mg Oral  Given 08/29/22 1342)  acetaminophen (TYLENOL) tablet 1,000 mg (1,000 mg Oral Given 08/29/22 1342)   Medical Decision Making:   Heather Fitzgerald is a 89 y.o. female who presented to the ED today with a fall at their home  detailed above. They are not on a blood thinner. Patient's presentation is complicated by their history of advanced age.  Patient placed on continuous vitals and telemetry monitoring while in ED which was reviewed periodically.   Complete initial physical exam performed, notably the patient  was hemodynamically stable  in no acute distress. No obvious deformities or injuries appreciated on extensive physical exam including active range of motion of all joints.     Reviewed and  confirmed nursing documentation for past medical history, family history, social history.    Initial Assessment/Plan:   This is a patient presenting with a moderate blunt mechanism trauma.  As such, I have considered intracranial injuries including intracranial hemorrhage, intrathoracic injuries including blunt myocardial or blunt lung injury, blunt abdominal injuries including aortic dissection, bladder injury, spleen injury, liver injury and I have considered orthopedic injuries including extremity or spinal injury. This is most consistent with an acute life/limb threatening illness complicated by underlying chronic conditions.  With the patient's presentation of moderate mechanism trauma but an otherwise reassuring exam, patient warrants targeted evaluation for potential traumatic injuries. Will proceed with targeted evaluation for potential injuries. Will proceed with CT Head, Cervical Spine CT, and Chest XR Images reviewed and agree with radiology interpretation.  CT HEAD WO CONTRAST (5MM)  Result Date: 08/29/2022 CLINICAL DATA:  Head trauma, minor (Age >= 65y); Polytrauma, blunt EXAM: CT HEAD WITHOUT CONTRAST CT CERVICAL SPINE WITHOUT CONTRAST TECHNIQUE: Multidetector CT imaging of the head and cervical spine was performed following the standard protocol without intravenous contrast. Multiplanar CT image reconstructions of the cervical spine were also generated. RADIATION DOSE REDUCTION: This exam was performed according to the departmental dose-optimization program which includes automated exposure control, adjustment of the mA and/or kV according to patient size and/or use of iterative reconstruction technique. COMPARISON:  None Available. FINDINGS: CT HEAD FINDINGS Brain: No evidence of acute infarction, hemorrhage, hydrocephalus, extra-axial collection or mass lesion/mass effect. Vascular: No hyperdense vessel identified. Skull: No acute fracture. Sinuses/Orbits: Clear sinuses.  No acute orbital  findings. Other: No mastoid effusions. CT CERVICAL SPINE FINDINGS Alignment: Mild anterolisthesis of C4 on C5 and C7 on T1, likely degenerative in etiology. Skull base and vertebrae: No evidence of acute fracture. Soft tissues and spinal canal: No prevertebral fluid or swelling. No visible canal hematoma. Disc levels:  Biapical pleuroparenchymal scarring. Upper chest: Biapical pleuroparenchymal scarring IMPRESSION: 1. No evidence of acute intracranial abnormality. 2. No evidence of acute fracture or traumatic malalignment in the cervical spine. Electronically Signed   By: Margaretha Sheffield M.D.   On: 08/29/2022 13:53   CT CERVICAL SPINE WO CONTRAST  Result Date: 08/29/2022 CLINICAL DATA:  Head trauma, minor (Age >= 65y); Polytrauma, blunt EXAM: CT HEAD WITHOUT CONTRAST CT CERVICAL SPINE WITHOUT CONTRAST TECHNIQUE: Multidetector CT imaging of the head and cervical spine was performed following the standard protocol without intravenous contrast. Multiplanar CT image reconstructions of the cervical spine were also generated. RADIATION DOSE REDUCTION: This exam was performed according to the departmental dose-optimization program which includes automated exposure control, adjustment of the mA and/or kV according to patient size and/or use of iterative reconstruction technique. COMPARISON:  None Available. FINDINGS: CT HEAD FINDINGS Brain: No evidence of acute infarction, hemorrhage, hydrocephalus, extra-axial collection or mass  lesion/mass effect. Vascular: No hyperdense vessel identified. Skull: No acute fracture. Sinuses/Orbits: Clear sinuses.  No acute orbital findings. Other: No mastoid effusions. CT CERVICAL SPINE FINDINGS Alignment: Mild anterolisthesis of C4 on C5 and C7 on T1, likely degenerative in etiology. Skull base and vertebrae: No evidence of acute fracture. Soft tissues and spinal canal: No prevertebral fluid or swelling. No visible canal hematoma. Disc levels:  Biapical pleuroparenchymal scarring.  Upper chest: Biapical pleuroparenchymal scarring IMPRESSION: 1. No evidence of acute intracranial abnormality. 2. No evidence of acute fracture or traumatic malalignment in the cervical spine. Electronically Signed   By: Margaretha Sheffield M.D.   On: 08/29/2022 13:53   DG Chest 2 View  Result Date: 08/29/2022 CLINICAL DATA:  Fall EXAM: CHEST - 2 VIEW COMPARISON:  None Available. FINDINGS: The heart size and mediastinal contours are within normal limits. Both lungs are clear. The visualized skeletal structures are unremarkable. IMPRESSION: No active cardiopulmonary disease. Electronically Signed   By: Yetta Glassman M.D.   On: 08/29/2022 13:47    Final Reassessment and Plan:   Reassessed patient after therapeutic treatment.  Symptoms are grossly resolved.  She has full range of motion of the arm.  Offered proceeding to dedicated left shoulder x-ray she still has some discomfort there.  However patient stated she would rather follow-up in the outpatient setting as her pain is grossly under control at this time.  We discussed pain control in the outpatient setting.  We discussed risk of falls with narcotic but given the level of pain that brought her in today, she would like to proceed with low-dose narcotic therapy as well as Tylenol 1000 mg every 6 as well as follow-up with PCP within 48 hours for reassessment of her pain.  She may need outpatient x-rays to evaluate for left shoulder injury if symptoms are continuing to worsen.  Disposition:  I have considered need for hospitalization, however, considering all of the above, I believe this patient is stable for discharge at this time.  Patient/family educated about specific return precautions for given chief complaint and symptoms.  Patient/family educated about follow-up with PCP.     Patient/family expressed understanding of return precautions and need for follow-up. Patient spoken to regarding all imaging and laboratory results and appropriate follow  up for these results. All education provided in verbal form with additional information in written form. Time was allowed for answering of patient questions. Patient discharged.    Emergency Department Medication Summary:   Medications  oxyCODONE (Oxy IR/ROXICODONE) immediate release tablet 2.5 mg (2.5 mg Oral Given 08/29/22 1342)  acetaminophen (TYLENOL) tablet 1,000 mg (1,000 mg Oral Given 08/29/22 1342)           Clinical Impression:  1. Neck pain   2. Fall, initial encounter      Data Unavailable   Final Clinical Impression(s) / ED Diagnoses Final diagnoses:  Neck pain  Fall, initial encounter    Rx / DC Orders ED Discharge Orders          Ordered    oxyCODONE (ROXICODONE) 5 MG immediate release tablet  Every 4 hours PRN        08/29/22 1441              Tretha Sciara, MD 08/29/22 1441

## 2022-08-29 NOTE — ED Triage Notes (Signed)
Pt ambulatory. Presents with complaint of fall yesterday. Unsure what caused her to fall. [address] forward and chin landed on chair causing hyperextension of neck. Pain right side of neck and now going down arm. Right lateral chest pain. No LOC no thinners

## 2022-09-04 DIAGNOSIS — M542 Cervicalgia: Secondary | ICD-10-CM | POA: Diagnosis not present

## 2022-09-15 DIAGNOSIS — D692 Other nonthrombocytopenic purpura: Secondary | ICD-10-CM | POA: Diagnosis not present

## 2022-09-15 DIAGNOSIS — I739 Peripheral vascular disease, unspecified: Secondary | ICD-10-CM | POA: Diagnosis not present

## 2022-09-15 DIAGNOSIS — N182 Chronic kidney disease, stage 2 (mild): Secondary | ICD-10-CM | POA: Diagnosis not present

## 2022-09-15 DIAGNOSIS — I10 Essential (primary) hypertension: Secondary | ICD-10-CM | POA: Diagnosis not present

## 2022-09-15 DIAGNOSIS — E782 Mixed hyperlipidemia: Secondary | ICD-10-CM | POA: Diagnosis not present

## 2022-09-22 DIAGNOSIS — Z23 Encounter for immunization: Secondary | ICD-10-CM | POA: Diagnosis not present

## 2022-09-22 DIAGNOSIS — I739 Peripheral vascular disease, unspecified: Secondary | ICD-10-CM | POA: Diagnosis not present

## 2022-09-22 DIAGNOSIS — D692 Other nonthrombocytopenic purpura: Secondary | ICD-10-CM | POA: Diagnosis not present

## 2022-09-22 DIAGNOSIS — E782 Mixed hyperlipidemia: Secondary | ICD-10-CM | POA: Diagnosis not present

## 2022-09-22 DIAGNOSIS — I10 Essential (primary) hypertension: Secondary | ICD-10-CM | POA: Diagnosis not present

## 2022-09-22 DIAGNOSIS — F419 Anxiety disorder, unspecified: Secondary | ICD-10-CM | POA: Diagnosis not present

## 2022-09-22 DIAGNOSIS — K589 Irritable bowel syndrome without diarrhea: Secondary | ICD-10-CM | POA: Diagnosis not present

## 2022-09-22 DIAGNOSIS — N182 Chronic kidney disease, stage 2 (mild): Secondary | ICD-10-CM | POA: Diagnosis not present

## 2022-09-22 DIAGNOSIS — I8312 Varicose veins of left lower extremity with inflammation: Secondary | ICD-10-CM | POA: Diagnosis not present

## 2022-09-22 DIAGNOSIS — Z0000 Encounter for general adult medical examination without abnormal findings: Secondary | ICD-10-CM | POA: Diagnosis not present

## 2022-09-22 DIAGNOSIS — M810 Age-related osteoporosis without current pathological fracture: Secondary | ICD-10-CM | POA: Diagnosis not present

## 2022-09-30 DIAGNOSIS — R519 Headache, unspecified: Secondary | ICD-10-CM | POA: Diagnosis not present

## 2022-09-30 DIAGNOSIS — H538 Other visual disturbances: Secondary | ICD-10-CM | POA: Diagnosis not present

## 2022-09-30 DIAGNOSIS — H532 Diplopia: Secondary | ICD-10-CM | POA: Diagnosis not present

## 2022-10-03 ENCOUNTER — Observation Stay (HOSPITAL_BASED_OUTPATIENT_CLINIC_OR_DEPARTMENT_OTHER)
Admission: EM | Admit: 2022-10-03 | Discharge: 2022-10-04 | Disposition: A | Discharge status: Home / Self Care | Payer: PPO | Attending: Internal Medicine | Admitting: Internal Medicine

## 2022-10-03 ENCOUNTER — Emergency Department (HOSPITAL_BASED_OUTPATIENT_CLINIC_OR_DEPARTMENT_OTHER): Payer: PPO

## 2022-10-03 ENCOUNTER — Other Ambulatory Visit: Payer: Self-pay

## 2022-10-03 ENCOUNTER — Encounter (HOSPITAL_BASED_OUTPATIENT_CLINIC_OR_DEPARTMENT_OTHER): Payer: Self-pay

## 2022-10-03 DIAGNOSIS — E876 Hypokalemia: Secondary | ICD-10-CM | POA: Diagnosis not present

## 2022-10-03 DIAGNOSIS — I10 Essential (primary) hypertension: Secondary | ICD-10-CM | POA: Diagnosis not present

## 2022-10-03 DIAGNOSIS — F419 Anxiety disorder, unspecified: Secondary | ICD-10-CM | POA: Diagnosis present

## 2022-10-03 DIAGNOSIS — Z79899 Other long term (current) drug therapy: Secondary | ICD-10-CM

## 2022-10-03 DIAGNOSIS — Z96642 Presence of left artificial hip joint: Secondary | ICD-10-CM | POA: Diagnosis present

## 2022-10-03 DIAGNOSIS — J45909 Unspecified asthma, uncomplicated: Secondary | ICD-10-CM | POA: Diagnosis not present

## 2022-10-03 DIAGNOSIS — Z882 Allergy status to sulfonamides status: Secondary | ICD-10-CM

## 2022-10-03 DIAGNOSIS — I6601 Occlusion and stenosis of right middle cerebral artery: Secondary | ICD-10-CM | POA: Diagnosis not present

## 2022-10-03 DIAGNOSIS — K219 Gastro-esophageal reflux disease without esophagitis: Secondary | ICD-10-CM | POA: Diagnosis present

## 2022-10-03 DIAGNOSIS — I6629 Occlusion and stenosis of unspecified posterior cerebral artery: Secondary | ICD-10-CM | POA: Diagnosis not present

## 2022-10-03 DIAGNOSIS — G459 Transient cerebral ischemic attack, unspecified: Secondary | ICD-10-CM | POA: Diagnosis not present

## 2022-10-03 DIAGNOSIS — H532 Diplopia: Secondary | ICD-10-CM | POA: Diagnosis not present

## 2022-10-03 DIAGNOSIS — R41841 Cognitive communication deficit: Secondary | ICD-10-CM | POA: Insufficient documentation

## 2022-10-03 DIAGNOSIS — I6622 Occlusion and stenosis of left posterior cerebral artery: Secondary | ICD-10-CM | POA: Diagnosis not present

## 2022-10-03 DIAGNOSIS — Z888 Allergy status to other drugs, medicaments and biological substances status: Secondary | ICD-10-CM

## 2022-10-03 DIAGNOSIS — Z85828 Personal history of other malignant neoplasm of skin: Secondary | ICD-10-CM

## 2022-10-03 DIAGNOSIS — R519 Headache, unspecified: Secondary | ICD-10-CM

## 2022-10-03 DIAGNOSIS — Z96651 Presence of right artificial knee joint: Secondary | ICD-10-CM | POA: Diagnosis present

## 2022-10-03 DIAGNOSIS — Z981 Arthrodesis status: Secondary | ICD-10-CM

## 2022-10-03 DIAGNOSIS — Z79890 Hormone replacement therapy (postmenopausal): Secondary | ICD-10-CM

## 2022-10-03 DIAGNOSIS — M1990 Unspecified osteoarthritis, unspecified site: Secondary | ICD-10-CM | POA: Diagnosis present

## 2022-10-03 DIAGNOSIS — Z91041 Radiographic dye allergy status: Secondary | ICD-10-CM

## 2022-10-03 LAB — COMPREHENSIVE METABOLIC PANEL
ALT: 15 U/L (ref 0–44)
AST: 16 U/L (ref 15–41)
Albumin: 4.1 g/dL (ref 3.5–5.0)
Alkaline Phosphatase: 44 U/L (ref 38–126)
Anion gap: 6 (ref 5–15)
BUN: 13 mg/dL (ref 8–23)
CO2: 30 mmol/L (ref 22–32)
Calcium: 8.8 mg/dL — ABNORMAL LOW (ref 8.9–10.3)
Chloride: 105 mmol/L (ref 98–111)
Creatinine, Ser: 0.81 mg/dL (ref 0.44–1.00)
GFR, Estimated: >60 mL/min (ref >60)
Glucose, Bld: 101 mg/dL — ABNORMAL HIGH (ref 70–99)
Potassium: 3.3 mmol/L — ABNORMAL LOW (ref 3.5–5.1)
Sodium: 141 mmol/L (ref 135–145)
Total Bilirubin: 1.2 mg/dL (ref 0.3–1.2)
Total Protein: 7.4 g/dL (ref 6.5–8.1)

## 2022-10-03 LAB — CBC WITH DIFFERENTIAL/PLATELET
Abs Immature Granulocytes: 0.01 10*3/uL (ref 0.00–0.07)
Basophils Absolute: 0 10*3/uL (ref 0.0–0.1)
Basophils Relative: 1 %
Eosinophils Absolute: 0.1 10*3/uL (ref 0.0–0.5)
Eosinophils Relative: 2 %
HCT: 40.9 % (ref 36.0–46.0)
Hemoglobin: 13.6 g/dL (ref 12.0–15.0)
Immature Granulocytes: 0 %
Lymphocytes Relative: 27 %
Lymphs Abs: 1.1 10*3/uL (ref 0.7–4.0)
MCH: 30.1 pg (ref 26.0–34.0)
MCHC: 33.3 g/dL (ref 30.0–36.0)
MCV: 90.5 fL (ref 80.0–100.0)
Monocytes Absolute: 0.4 10*3/uL (ref 0.1–1.0)
Monocytes Relative: 9 %
Neutro Abs: 2.6 10*3/uL (ref 1.7–7.7)
Neutrophils Relative %: 61 %
Platelets: 164 10*3/uL (ref 150–400)
RBC: 4.52 MIL/uL (ref 3.87–5.11)
RDW: 13.7 % (ref 11.5–15.5)
WBC: 4.2 10*3/uL (ref 4.0–10.5)
nRBC: 0 % (ref 0.0–0.2)

## 2022-10-03 LAB — SEDIMENTATION RATE: Sed Rate: 10 mm/hr (ref 0–22)

## 2022-10-03 LAB — C-REACTIVE PROTEIN: CRP: 0.6 mg/dL (ref <1.0)

## 2022-10-03 MED ORDER — SODIUM CHLORIDE 0.9 % IV BOLUS
1000.0000 mL | Freq: Once | INTRAVENOUS | Status: AC
  Administered 2022-10-03: 1000 mL via INTRAVENOUS

## 2022-10-03 MED ORDER — ALPRAZOLAM 0.5 MG PO TABS
0.2500 mg | ORAL_TABLET | Freq: Once | ORAL | Status: AC
  Administered 2022-10-03: 0.25 mg via ORAL
  Filled 2022-10-03: qty 1

## 2022-10-03 MED ORDER — STROKE: EARLY STAGES OF RECOVERY BOOK
Freq: Once | Status: AC
  Filled 2022-10-03: qty 1

## 2022-10-03 MED ORDER — DIPHENHYDRAMINE HCL 50 MG/ML IJ SOLN
50.0000 mg | Freq: Once | INTRAMUSCULAR | Status: AC
  Administered 2022-10-03: 50 mg via INTRAVENOUS
  Filled 2022-10-03: qty 1

## 2022-10-03 MED ORDER — DEXAMETHASONE SODIUM PHOSPHATE 10 MG/ML IJ SOLN: 8.0000 mg | Freq: Once | INTRAMUSCULAR | Status: DC

## 2022-10-03 MED ORDER — METHYLPREDNISOLONE SODIUM SUCC 40 MG IJ SOLR
40.0000 mg | Freq: Once | INTRAMUSCULAR | Status: AC
  Administered 2022-10-03: 40 mg via INTRAVENOUS
  Filled 2022-10-03: qty 1

## 2022-10-03 MED ORDER — IOHEXOL 350 MG/ML SOLN
75.0000 mL | Freq: Once | INTRAVENOUS | Status: AC | PRN
  Administered 2022-10-03: 75 mL via INTRAVENOUS

## 2022-10-03 MED ORDER — METHYLPREDNISOLONE SODIUM SUCC 125 MG IJ SOLR: 125.0000 mg | Freq: Once | INTRAMUSCULAR | Status: DC

## 2022-10-03 MED ORDER — METOCLOPRAMIDE HCL 5 MG/ML IJ SOLN
10.0000 mg | Freq: Once | INTRAMUSCULAR | Status: AC
  Administered 2022-10-03: 10 mg via INTRAVENOUS
  Filled 2022-10-03: qty 2

## 2022-10-03 MED ORDER — ENOXAPARIN SODIUM 40 MG/0.4ML IJ SOSY
40.0000 mg | PREFILLED_SYRINGE | INTRAMUSCULAR | Status: DC
  Administered 2022-10-04: 40 mg via SUBCUTANEOUS
  Filled 2022-10-03: qty 0.4

## 2022-10-03 MED ORDER — SENNOSIDES-DOCUSATE SODIUM 8.6-50 MG PO TABS: 1.0000 | ORAL_TABLET | Freq: Every evening | ORAL | Status: DC | PRN

## 2022-10-03 MED ORDER — KETOROLAC TROMETHAMINE 15 MG/ML IJ SOLN
15.0000 mg | Freq: Once | INTRAMUSCULAR | Status: AC
  Administered 2022-10-03: 15 mg via INTRAVENOUS
  Filled 2022-10-03: qty 1

## 2022-10-03 MED ORDER — DIPHENHYDRAMINE HCL 50 MG/ML IJ SOLN: 25.0000 mg | Freq: Once | INTRAMUSCULAR | Status: DC

## 2022-10-03 MED ORDER — ACETAMINOPHEN 160 MG/5ML PO SOLN: 650.0000 mg | ORAL | Status: DC | PRN

## 2022-10-03 MED ORDER — ACETAMINOPHEN 325 MG PO TABS
650.0000 mg | ORAL_TABLET | ORAL | Status: DC | PRN
  Administered 2022-10-04: 650 mg via ORAL
  Filled 2022-10-03: qty 2

## 2022-10-03 MED ORDER — ACETAMINOPHEN 650 MG RE SUPP: 650.0000 mg | RECTAL | Status: DC | PRN

## 2022-10-03 NOTE — ED Provider Notes (Signed)
[facility] Provider Note   CSN: [id number] Arrival date & time: 10/03/22  [id number]     History  Chief Complaint  Patient presents with   Headache    Heather Fitzgerald is a 89 y.o. female, no pertinent past medical history, who presents to the ED secondary to headache that is been going on for the past week, after she had an episode of double vision that lasted less than 10 minutes, last Friday afternoon.  She states that she was driving, just does not remember what happened, and then noticed she was sitting at the light, and she saw 2 of the light, this lasted for less than 10 minutes, and then she developed a headache, that was on the left side of her head, last night.  She says she went to her doctor, and they told her that she needed to see her eye doctor, but she cannot get in until Monday, and she wants to make sure that she did not have a stroke.  Denies any changes in vision since then, difficulty swallowing, weakness on one side of the body, confusion, or falls.  No history of diabetes, or high blood pressure.  Notes that she has tried Tylenol, and her doctor gave her shot in office, and she has not had relief of her headache.    Home Medications Prior to Admission medications   Medication Sig Start Date End Date Taking? Authorizing Provider  albuterol (VENTOLIN HFA) 108 (90 Base) MCG/ACT inhaler Inhale 1 puff into the lungs every 6 (six) hours as needed for wheezing. 12/26/19  Yes [provider]  ALPRAZolam Duanne Moron) 0.25 MG tablet Take 0.25 mg by mouth daily as needed for anxiety. 03/08/20  Yes [provider]  amLODipine (NORVASC) 2.5 MG tablet Take 2.5 mg by mouth every evening.   Yes [provider]  atorvastatin (LIPITOR) 10 MG tablet Take 10 mg by mouth daily. 02/17/22  Yes [provider]  busPIRone (BUSPAR) 7.5 MG tablet Take 7.5 mg by mouth 2 (two) times daily.   Yes [provider]   estradiol (ESTRACE) 1 MG tablet Take 1 mg by mouth daily. 06/18/20  Yes [provider]  hydroxypropyl methylcellulose / hypromellose (ISOPTO TEARS / GONIOVISC) 2.5 % ophthalmic solution Place 1 drop into both eyes daily as needed for dry eyes.   Yes [provider]  omeprazole (PRILOSEC) 40 MG capsule Take 40 mg by mouth in the morning. 04/21/22  Yes [provider]  docusate sodium (COLACE) 100 MG capsule Take 1 capsule (100 mg total) by mouth 2 (two) times daily. Patient not taking: No sig reported 01/27/20   Danae Orleans, PA-C  doxycycline (VIBRAMYCIN) 100 MG capsule Take 1 capsule (100 mg total) by mouth 2 (two) times daily. 08/05/20   Gareth Morgan, MD  ferrous sulfate (FERROUSUL) 325 (65 FE) MG tablet Take 1 tablet (325 mg total) by mouth 3 (three) times daily with meals for 14 days. Patient not taking: Reported on 08/05/2020 01/27/20 02/10/20  Danae Orleans, PA-C  HYDROcodone-acetaminophen (NORCO) 5-325 MG tablet Take 1-2 tablets by mouth every 6 (six) hours as needed for moderate pain or severe pain. Patient not taking: No sig reported 01/27/20   Danae Orleans, PA-C  ibuprofen (ADVIL) 100 MG/5ML suspension Take 200 mg by mouth every 8 (eight) hours as needed for moderate pain.    [provider]  methocarbamol (ROBAXIN) 500 MG tablet Take 1 tablet (500 mg total) by mouth every  6 (six) hours as needed for muscle spasms. Patient not taking: No sig reported 01/27/20   Danae Orleans, PA-C  Naproxen Sodium 220 MG CAPS Take 220 mg by mouth daily as needed (pain).    [provider]  ondansetron (ZOFRAN) 4 MG tablet Take 1 tablet (4 mg total) by mouth every 8 (eight) hours as needed for nausea or vomiting. Patient not taking: Reported on 08/05/2020 01/27/20   Danae Orleans, PA-C  oxyCODONE (ROXICODONE) 5 MG immediate release tablet Take 0.5 tablets (2.5 mg total) by mouth every 4 (four) hours as needed for severe pain. Patient not taking:  Reported on 10/03/2022 08/29/22   Tretha Sciara, MD  polyethylene glycol (MIRALAX / GLYCOLAX) 17 g packet Take 17 g by mouth 2 (two) times daily. Patient not taking: Reported on 10/03/2022 01/27/20   Danae Orleans, PA-C      Allergies    Alendronate, Iohexol, Nitrofurantoin, Sulfa antibiotics, and Losartan    Review of Systems   Review of Systems  Neurological:  Positive for headaches. Negative for dizziness.    Physical Exam Updated Vital Signs BP 109/71   Pulse 73   Temp 98.3 F (36.8 C) (Oral)   Resp 14   Ht '5\' 2"'$  (1.575 m)   Wt 51.5 kg   SpO2 100%   BMI 20.76 kg/m  Physical Exam Vitals and nursing note reviewed.  Constitutional:      General: She is not in acute distress.    Appearance: She is well-developed.  HENT:     Head: Normocephalic and atraumatic.  Eyes:     General: No visual field deficit.    Conjunctiva/sclera: Conjunctivae normal.  Cardiovascular:     Rate and Rhythm: Normal rate and regular rhythm.     Heart sounds: No murmur heard. Pulmonary:     Effort: Pulmonary effort is normal. No respiratory distress.     Breath sounds: Normal breath sounds.  Abdominal:     Palpations: Abdomen is soft.     Tenderness: There is no abdominal tenderness.  Musculoskeletal:        General: No swelling.     Cervical back: Neck supple.  Skin:    General: Skin is warm and dry.     Capillary Refill: Capillary refill takes less than 2 seconds.  Neurological:     Mental Status: She is alert.     Cranial Nerves: No cranial nerve deficit, dysarthria or facial asymmetry.     Sensory: No sensory deficit.     Motor: No weakness.  Psychiatric:        Mood and Affect: Mood normal.    ED Results / Procedures / Treatments   Labs (all labs ordered are listed, but only abnormal results are displayed) Labs Reviewed  COMPREHENSIVE METABOLIC PANEL - Abnormal; Notable for the following components:      Result Value   Potassium 3.3 (*)    Glucose, Bld 101 (*)     Calcium 8.8 (*)    All other components within normal limits  CBC WITH DIFFERENTIAL/PLATELET  SEDIMENTATION RATE  C-REACTIVE PROTEIN    EKG EKG Interpretation  Date/Time:  Friday October 03 2022 09:40:58 EST Ventricular Rate:  95 PR Interval:  145 QRS Duration: 85 QT Interval:  383 QTC Calculation: 482 R Axis:   37 Text Interpretation: Normal sinus rhythm Premature atrial complexes new compared to prior EKG Confirmed by Leanord Asal ([id number]) on 10/03/2022 9:43:25 AM  Radiology CT Head Wo Contrast  Result Date: 10/03/2022 CLINICAL DATA:  Headache EXAM: CT HEAD WITHOUT CONTRAST TECHNIQUE: Contiguous axial images were obtained from the base of the skull through the vertex without intravenous contrast. RADIATION DOSE REDUCTION: This exam was performed according to the departmental dose-optimization program which includes automated exposure control, adjustment of the mA and/or kV according to patient size and/or use of iterative reconstruction technique. COMPARISON:  CT Head 08/29/22 FINDINGS: Brain: No evidence of acute infarction, hemorrhage, hydrocephalus, extra-axial collection or mass lesion/mass effect. Vascular: No hyperdense vessel or unexpected calcification. Skull: Normal. Negative for fracture or focal lesion. Sinuses/Orbits: No middle ear or mastoid effusion. Paranasal sinuses are clear. Orbits are unremarkable. Other: None. IMPRESSION: No acute intracranial abnormality. Electronically Signed   By: Marin Roberts M.D.   On: 10/03/2022 10:06    Procedures Procedures    Medications Ordered in ED Medications  diphenhydrAMINE (BENADRYL) injection 50 mg (has no administration in time range)  ketorolac (TORADOL) 15 MG/ML injection 15 mg (15 mg Intravenous Given 10/03/22 1101)  sodium chloride 0.9 % bolus 1,000 mL (1,000 mLs Intravenous New Bag/Given 10/03/22 1101)  metoCLOPramide (REGLAN) injection 10 mg (10 mg Intravenous Given 10/03/22 1103)  methylPREDNISolone sodium succinate  (SOLU-MEDROL) 40 mg/mL injection 40 mg (40 mg Intravenous Given 10/03/22 1104)  ALPRAZolam (XANAX) tablet 0.25 mg (0.25 mg Oral Given 10/03/22 1115)    ED Course/ Medical Decision Making/ A&P                             Medical Decision Making Patient is an 89 year old female, here with a persistent headache for the last week, after an episode of double vision, she states it is left-sided along her head, and aching in nature.  Has been refractory to Tylenol and Toradol.  She has no neurodeficits on exam however given her headache, that is new onset, we will obtain a CT head, basic labs including CBC, CMP.  Likely to further consult neurology.  Amount and/or Complexity of Data Reviewed Labs: ordered.    Details: Unremarkable, CRP, sed rate pending.. Radiology: ordered.    Details: CT head unremarkable ECG/medicine tests:     Details: No P waves noted initial strip except for at the end, when I returned to sinus rhythm.  Difficult to tell whether PACs versus A-fib, and beginning and then proceeds into sinus rhythm. Discussion of management or test interpretation with external provider(s): Discussed with Dr. Rory Percy, he recommends admission, with MRI, CRP, sed rate, and need for CTA head/neck.  Patient does have a allergy, to contrast, she states that she had nausea in the past, and vomited, we will pretreat with Benadryl, and Solu-Medrol, this was discussed with CT, she will need to be transferred for her MRI and admission. Spoke with Dr. Linda Hedges, he accepts admission of patient. Patient intermittently with irregular heart beat, difficult to tell whether PACs versus A-fib.  See EKG for further evaluation, repeat EKG ordered.  May need an echocardiogram, versus telemetry versus cardiac consult.  Pending hospitalist eval.  Risk Prescription drug management. Decision regarding hospitalization.    Final Clinical Impression(s) / ED Diagnoses Final diagnoses:  TIA (transient ischemic attack)  Double  vision  Acute intractable headache, unspecified headache type    Rx / DC Orders ED Discharge Orders     None         Kiaan Overholser, Si Gaul, PA 10/03/22 1149    Leanord Asal K, DO 10/03/22 1235
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
## 2022-10-03 NOTE — Plan of Care (Addendum)
ON CALL PHONE CONSULT  Call from ED provider Small'@[email or web address]'$  [facility]   Discussion: 89 year old with no significant risk factors currently hypertensive presenting with history of transient episode of diplopia that lasted a few minutes followed by severe headache that has not resolved since then.  No current visual symptoms. Recommended CT angio head and neck, MRI brain without contrast, ESR and CRP.  Differentials include stroke/Jenise arteritis. Once imaging is completed, reach back out to neurology for further recommendations.   Addendum Has persistent headache Needs prep for CTA due to allergy Recommend admission to the hospitalist and neurological consultation at [place], MD Neurologist [facility] Pager: [phone]

## 2022-10-03 NOTE — ED Notes (Signed)
Pt ambulatory to [facility]

## 2022-10-03 NOTE — ED Notes (Signed)
luigis frozen ice pop, applesauce and unsweetened ice-tea 236 ml  given to patient RN approved
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
## 2022-10-03 NOTE — Consult Note (Incomplete)
Neurology Consultation    Reason for Consult:diplopia and headache  CC: neck pain   HISTORY OF PRESENT ILLNESS   HPI   Heather Fitzgerald is a 89 y.o. female with a past medical history of anxiety, arthritis, asthma, GERD, hypertension, thrombocytopenia initially presenting with an episode of diplopia and a persistent headache. She has had a headache for the past week. She additionally reports an episode of double vision that lasted about 10 minutes on Friday afternoon.   History is obtained from: Chart review  Premorbid modified Rankin scale (mRS):  0-Completely asymptomatic and back to baseline post-stroke 1-No significant post stroke disability and can perform usual duties with stroke symptoms 2-Slight disability-UNABLE to perform all activities but does not need assistance  3-Moderate disability-requires help but walks WITHOUT assistance 4-Needs assistance to walk and tend to bodily needs 5-Severe disability-bedridden, incontinent, needs constant attention 6- Death  {NEURO ROS:10969::"Denies any new symptoms of  ***"}  ROS: {Ros - complete:30496::"A comprehensive review of systems was negative."} {Ros - complete:30496}  PAST MEDICAL HISTORY    Past Medical History:  Past Medical History:  Diagnosis Date   Anxiety    Arthritis    Asthma    hx of    Cancer ([place])    face pre cancerous lesion removed   GERD (gastroesophageal reflux disease)    Headache(784.0)    Hypertension    Thrombocytopenia (HCC)     No family history on file. History reviewed. No pertinent family history.  Allergies:  Allergies  Allergen Reactions   Alendronate Other (See Comments)    GI upset   Iohexol Hives and Other (See Comments)    Code: HIVES, Desc: (+) SKIN TEST 19YRS AGO, OK W/ 1VP 15 YRS AGO, TODAY W/ HIVES, '50MG'$  BENADRYL PO.Marland KitchenPT OK, SUGGESTED 13 PRE MEDS FOR FUTURE IV CONTRAST PER DR. MATTERN//A.C.PT CALLS [place] 2 DAYS POST INJ, SHE HAS BEEN NAUSEATED SINCE EXAM & HAS STARTED TO  ITCH AGAIN, WE RE, Onset Date: 04/06/2006       Nitrofurantoin Other (See Comments)   Sulfa Antibiotics Nausea Only   Losartan Rash    Social History:   reports that she has never smoked. She has never used smokeless tobacco. She reports that she does not drink alcohol and does not use drugs.    Medications (Not in a hospital admission)   EXAMINATION    Current vital signs:    10/03/2022   12:45 PM 10/03/2022   12:30 PM 10/03/2022   10:45 AM  Vitals with BMI  Systolic 0000000 [id number] 0000000  Diastolic 51 60 71  Pulse 63 68 73    Examination: *** GENERAL: Awake, alert in NAD HEENT: - Normocephalic and atraumatic, dry mm, no lymphadenopathy, no Thyromegally LUNGS - Clear to auscultation bilaterally CV - S1S2 RRR, equal pulses bilaterally. ABDOMEN - Soft, nontender, nondistended with normoactive BS Ext: warm, well perfused, intact peripheral pulses, no pedal edema  NEURO:  Mental Status: AA&Ox3  Language: speech is clear.  Intact naming, repetition, fluency, and comprehension. Cranial Nerves:  II: PERRL. Visual fields full to confrontation.  III, IV, VI: EOM in tact. Eyelids elevate symmetrically. Blinks to threat.  V: Sensation intact V1-3 symmetrically  VII: no facial asymmetry   VIII: hearing intact to voice IX, X: Palate elevates symmetrically. Phonation is normal.  [id number] shrug 5/5 and symmetrical  XII: tongue is midline without fasciculations. Motor:  RUE:  grip   /5    biceps  /5    triceps  /5  LUE: grip  /5    biceps   /5    triceps  /5 RLE: thigh  /5    knee   /5     plantar flexion   /5     dorsiflexion   /5        LLE: thigh   /5    knee  /5    plantar flexion    /5     dorsiflexion    /5  Tone: is normal and bulk is normal DTRs: 2+ and symmetrical throughout   Sensation- Intact to light touch, pin prick, vibratory sensation, and temperature bilaterally Coordination: FTN intact bilaterally, no ataxia in BLE., no abnormal movements  Gait-  deferred    LABS   I have reviewed labs in epic and the results pertinent to this consultation are:  No results found for: "[facility]" Lab Results  Component Value Date   ALT 15 10/03/2022   AST 16 10/03/2022   ALKPHOS 44 10/03/2022   BILITOT 1.2 10/03/2022   No results found for: "HGBA1C" Lab Results  Component Value Date   WBC 4.2 10/03/2022   HGB 13.6 10/03/2022   HCT 40.9 10/03/2022   MCV 90.5 10/03/2022   PLT 164 10/03/2022   No results found for: "VITAMINB12" No results found for: "FOLATE" Lab Results  Component Value Date   NA 141 10/03/2022   K 3.3 (L) 10/03/2022   CL 105 10/03/2022   CO2 30 10/03/2022   CRP- 0.6 Sed Rate 10  DIAGNOSTIC IMAGING/PROCEDURES   I have reviewed the images obtained:, as below    CT-head- No acute intracranial abnormality   CTA head and neck   CT perfusion   MRI brain  EEG   ASSESSMENT/PLAN    Assessment:   Impression: Stroke vs giant cell arteritis  Recommendations: - CTA Head and Neck - ESR, CRP  -- Patient seen and examined by NP/APP with MD. MD to update note as needed.   Janine Ores, DNP, FNP-BC [facility] Pager: [phone]   **This documentation was dictated using Hurley and may contain inadvertent errors **

## 2022-10-03 NOTE — ED Notes (Signed)
Called [facility] for transport talked to Lauren at 2:19

## 2022-10-04 ENCOUNTER — Inpatient Hospital Stay (HOSPITAL_COMMUNITY): Payer: PPO

## 2022-10-04 ENCOUNTER — Inpatient Hospital Stay (HOSPITAL_BASED_OUTPATIENT_CLINIC_OR_DEPARTMENT_OTHER): Payer: PPO

## 2022-10-04 DIAGNOSIS — G459 Transient cerebral ischemic attack, unspecified: Secondary | ICD-10-CM | POA: Diagnosis not present

## 2022-10-04 DIAGNOSIS — I639 Cerebral infarction, unspecified: Secondary | ICD-10-CM | POA: Diagnosis not present

## 2022-10-04 DIAGNOSIS — R2990 Unspecified symptoms and signs involving the nervous system: Secondary | ICD-10-CM | POA: Diagnosis not present

## 2022-10-04 DIAGNOSIS — H532 Diplopia: Secondary | ICD-10-CM | POA: Diagnosis not present

## 2022-10-04 LAB — CBC
HCT: 38 % (ref 36.0–46.0)
Hemoglobin: 13.1 g/dL (ref 12.0–15.0)
MCH: 30.5 pg (ref 26.0–34.0)
MCHC: 34.5 g/dL (ref 30.0–36.0)
MCV: 88.4 fL (ref 80.0–100.0)
Platelets: 147 10*3/uL — ABNORMAL LOW (ref 150–400)
RBC: 4.3 MIL/uL (ref 3.87–5.11)
RDW: 13.8 % (ref 11.5–15.5)
WBC: 5.8 10*3/uL (ref 4.0–10.5)
nRBC: 0 % (ref 0.0–0.2)

## 2022-10-04 LAB — COMPREHENSIVE METABOLIC PANEL
ALT: 13 U/L (ref 0–44)
AST: 14 U/L — ABNORMAL LOW (ref 15–41)
Albumin: 3.2 g/dL — ABNORMAL LOW (ref 3.5–5.0)
Alkaline Phosphatase: 37 U/L — ABNORMAL LOW (ref 38–126)
Anion gap: 10 (ref 5–15)
BUN: 9 mg/dL (ref 8–23)
CO2: 24 mmol/L (ref 22–32)
Calcium: 7.9 mg/dL — ABNORMAL LOW (ref 8.9–10.3)
Chloride: 104 mmol/L (ref 98–111)
Creatinine, Ser: 0.7 mg/dL (ref 0.44–1.00)
GFR, Estimated: >60 mL/min (ref >60)
Glucose, Bld: 106 mg/dL — ABNORMAL HIGH (ref 70–99)
Potassium: 2.9 mmol/L — ABNORMAL LOW (ref 3.5–5.1)
Sodium: 138 mmol/L (ref 135–145)
Total Bilirubin: 0.8 mg/dL (ref 0.3–1.2)
Total Protein: 5.6 g/dL — ABNORMAL LOW (ref 6.5–8.1)

## 2022-10-04 LAB — LIPID PANEL
Cholesterol: 168 mg/dL (ref 0–200)
HDL: 82 mg/dL (ref >40)
LDL Cholesterol: 74 mg/dL (ref 0–99)
Total CHOL/HDL Ratio: 2 RATIO
Triglycerides: 60 mg/dL (ref <150)
VLDL: 12 mg/dL (ref 0–40)

## 2022-10-04 LAB — ECHOCARDIOGRAM COMPLETE
AR max vel: 1.9 cm2
AV Area VTI: 1.85 cm2
AV Area mean vel: 2 cm2
AV Mean grad: 2 mmHg
AV Peak grad: 4.7 mmHg
Ao pk vel: 1.08 m/s
Area-P 1/2: 4.48 cm2
Height: 62 in
S' Lateral: 2.1 cm
Weight: 1798.95 oz

## 2022-10-04 LAB — HEMOGLOBIN A1C
Hgb A1c MFr Bld: 5.3 % (ref 4.8–5.6)
Mean Plasma Glucose: 105.41 mg/dL

## 2022-10-04 LAB — SEDIMENTATION RATE: Sed Rate: 6 mm/hr (ref 0–22)

## 2022-10-04 LAB — GLUCOSE, CAPILLARY: Glucose-Capillary: 128 mg/dL — ABNORMAL HIGH (ref 70–99)

## 2022-10-04 LAB — C-REACTIVE PROTEIN: CRP: <0.5 mg/dL (ref <1.0)

## 2022-10-04 MED ORDER — POTASSIUM CHLORIDE CRYS ER 20 MEQ PO TBCR
40.0000 meq | EXTENDED_RELEASE_TABLET | Freq: Once | ORAL | Status: DC
  Filled 2022-10-04: qty 2

## 2022-10-04 MED ORDER — ASPIRIN 325 MG PO TABS: 325.0000 mg | ORAL_TABLET | Freq: Once | ORAL | Status: DC

## 2022-10-04 MED ORDER — ATORVASTATIN CALCIUM 80 MG PO TABS
80.0000 mg | ORAL_TABLET | Freq: Every day | ORAL | Status: DC
  Administered 2022-10-04: 80 mg via ORAL
  Filled 2022-10-04: qty 1

## 2022-10-04 MED ORDER — LORAZEPAM 2 MG/ML IJ SOLN
1.0000 mg | Freq: Once | INTRAMUSCULAR | Status: AC | PRN
  Administered 2022-10-04: 1 mg via INTRAVENOUS
  Filled 2022-10-04: qty 1

## 2022-10-04 MED ORDER — POTASSIUM CHLORIDE 20 MEQ PO PACK
40.0000 meq | PACK | Freq: Two times a day (BID) | ORAL | Status: DC
  Administered 2022-10-04: 40 meq via ORAL
  Filled 2022-10-04: qty 2

## 2022-10-04 MED ORDER — BUSPIRONE HCL 15 MG PO TABS
7.5000 mg | ORAL_TABLET | Freq: Two times a day (BID) | ORAL | Status: DC
  Administered 2022-10-04: 7.5 mg via ORAL
  Filled 2022-10-04 (×4): qty 1

## 2022-10-04 MED ORDER — ATORVASTATIN CALCIUM 10 MG PO TABS: 10.0000 mg | ORAL_TABLET | Freq: Every day | ORAL | Status: DC

## 2022-10-04 MED ORDER — ORAL CARE MOUTH RINSE: 15.0000 mL | OROMUCOSAL | Status: DC | PRN

## 2022-10-04 MED ORDER — HYDRALAZINE HCL 20 MG/ML IJ SOLN: 5.0000 mg | INTRAMUSCULAR | Status: DC | PRN

## 2022-10-04 MED ORDER — POLYVINYL ALCOHOL 1.4 % OP SOLN: 1.0000 [drp] | Freq: Every day | OPHTHALMIC | Status: DC | PRN

## 2022-10-04 NOTE — Plan of Care (Signed)
  Problem: Education: Goal: Knowledge of General Education information will improve Description: Including pain rating scale, medication(s)/side effects and non-pharmacologic comfort measures Outcome: Progressing   Problem: Self-Care: Goal: Ability to participate in self-care as condition permits will improve Outcome: Progressing Goal: Ability to communicate needs accurately will improve Outcome: Progressing

## 2022-10-04 NOTE — Procedures (Signed)
Patient Name: Heather Fitzgerald  MRN: [id number]  Epilepsy Attending: Lora Havens  Referring Physician/Provider: Dorene Grebe, MD  Date: 10/04/2022 Duration: 29.15 mins  Patient history:  89 y.o. female with history of hypertension anxiety thrombocytopenia had transient diplopia and confusion.  EEG to evaluate for seizure  Level of alertness: Awake, asleep  AEDs during EEG study: None  Technical aspects: This EEG study was done with scalp electrodes positioned according to the 10-20 International system of electrode placement. Electrical activity was reviewed with band pass filter of 1-'70Hz'$ , sensitivity of 7 uV/mm, display speed of 50m/sec with a '60Hz'$  notched filter applied as appropriate. EEG data were recorded continuously and digitally stored.  Video monitoring was available and reviewed as appropriate.  Description: The posterior dominant rhythm consists of 9 Hz activity of moderate voltage (25-35 uV) seen predominantly in posterior head regions, symmetric and reactive to eye opening and eye closing. Sleep was characterized by vertex waves, sleep spindles (12 to 14 Hz), maximal frontocentral region. Hyperventilation and photic stimulation were not performed.     IMPRESSION: This study is within normal limits. No seizures or epileptiform discharges were seen throughout the recording.  A normal interictal EEG does not exclude the diagnosis of epilepsy.  Heather Fitzgerald

## 2022-10-04 NOTE — Plan of Care (Signed)
  Problem: Education: Goal: Knowledge of General Education information will improve Description: Including pain rating scale, medication(s)/side effects and non-pharmacologic comfort measures 10/04/2022 1839 by Charlestine Night, RN Outcome: Adequate for Discharge 10/04/2022 1503 by Charlestine Night, RN Outcome: Progressing   Problem: Health Behavior/Discharge Planning: Goal: Ability to manage health-related needs will improve Outcome: Adequate for Discharge   Problem: Clinical Measurements: Goal: Ability to maintain clinical measurements within normal limits will improve Outcome: Adequate for Discharge Goal: Will remain free from infection Outcome: Adequate for Discharge Goal: Diagnostic test results will improve Outcome: Adequate for Discharge Goal: Respiratory complications will improve Outcome: Adequate for Discharge Goal: Cardiovascular complication will be avoided Outcome: Adequate for Discharge   Problem: Activity: Goal: Risk for activity intolerance will decrease Outcome: Adequate for Discharge   Problem: Nutrition: Goal: Adequate nutrition will be maintained Outcome: Adequate for Discharge   Problem: Coping: Goal: Level of anxiety will decrease Outcome: Adequate for Discharge   Problem: Elimination: Goal: Will not experience complications related to bowel motility Outcome: Adequate for Discharge Goal: Will not experience complications related to urinary retention Outcome: Adequate for Discharge   Problem: Pain Managment: Goal: General experience of comfort will improve Outcome: Adequate for Discharge   Problem: Safety: Goal: Ability to remain free from injury will improve Outcome: Adequate for Discharge   Problem: Skin Integrity: Goal: Risk for impaired skin integrity will decrease Outcome: Adequate for Discharge   Problem: Education: Goal: Knowledge of disease or condition will improve Outcome: Adequate for Discharge Goal: Knowledge of secondary  prevention will improve (MUST DOCUMENT ALL) Outcome: Adequate for Discharge Goal: Knowledge of patient specific risk factors will improve Elta [place] N/A or DELETE if not current risk factor) Outcome: Adequate for Discharge   Problem: Ischemic Stroke/TIA Tissue Perfusion: Goal: Complications of ischemic stroke/TIA will be minimized Outcome: Adequate for Discharge   Problem: Coping: Goal: Will verbalize positive feelings about self Outcome: Adequate for Discharge Goal: Will identify appropriate support needs Outcome: Adequate for Discharge   Problem: Health Behavior/Discharge Planning: Goal: Ability to manage health-related needs will improve Outcome: Adequate for Discharge Goal: Goals will be collaboratively established with patient/family Outcome: Adequate for Discharge   Problem: Self-Care: Goal: Ability to participate in self-care as condition permits will improve 10/04/2022 1839 by Charlestine Night, RN Outcome: Adequate for Discharge 10/04/2022 1503 by Charlestine Night, RN Outcome: Progressing Goal: Verbalization of feelings and concerns over difficulty with self-care will improve Outcome: Adequate for Discharge Goal: Ability to communicate needs accurately will improve 10/04/2022 1839 by Charlestine Night, RN Outcome: Adequate for Discharge 10/04/2022 1503 by Charlestine Night, RN Outcome: Progressing   Problem: Nutrition: Goal: Risk of aspiration will decrease Outcome: Adequate for Discharge Goal: Dietary intake will improve Outcome: Adequate for Discharge

## 2022-10-04 NOTE — Evaluation (Signed)
Physical Therapy Evaluation and Discharge Patient Details Name: Heather Fitzgerald MRN: [id number] DOB: 23-Oct-1933 Today's Date: 10/04/2022  History of Present Illness  Pt is an 89 y/o F presenting to ED on 2/23 after episode of double vision that has resolved, and persistent headache. MRI negative. PMH includes anxiety, asthma, GERD, HTN, thrombocytopenia  Clinical Impression  Patient evaluated by Physical Therapy with no further acute PT needs identified. Pt reports feeling close to her functional baseline and denies diplopia. Presents with mild dynamic deficits in setting of bilateral toe deformities (chronic). Pt ambulating 300 ft with no assistive device or physical assist. Negotiated 5 steps with and without railings to simulate home set up. All education has been completed and the patient has no further questions. No follow-up Physical Therapy or equipment needs. PT is signing off. Thank you for this referral.      Recommendations for follow up therapy are one component of a multi-disciplinary discharge planning process, led by the attending physician.  Recommendations may be updated based on patient status, additional functional criteria and insurance authorization.  Follow Up Recommendations No PT follow up      Assistance Recommended at Discharge Intermittent Supervision/Assistance  Patient can return home with the following  Assist for transportation    Equipment Recommendations None recommended by PT  Recommendations for Other Services       Functional Status Assessment Patient has not had a recent decline in their functional status     Precautions / Restrictions Precautions Precautions: None Restrictions Weight Bearing Restrictions: No      Mobility  Bed Mobility Overal bed mobility: Modified Independent             General bed mobility comments: no physical assist provided    Transfers Overall transfer level: Independent Equipment used: None                     Ambulation/Gait Ambulation/Gait assistance: Supervision Gait Distance (Feet): 300 Feet Assistive device: None Gait Pattern/deviations: Step-through pattern, Decreased stride length       General Gait Details: Mild dynamic instability, able to perform high level balance activities i.e. head turns, stepping over/around obstacles with mildly decreased gait speed, unable to significantly vary gait speed  Stairs Stairs: Yes Stairs assistance: Modified independent (Device/Increase time) Stair Management: One rail Left, No rails Number of Stairs: 5 General stair comments: step by step pattern  Wheelchair Mobility    Modified Rankin (Stroke Patients Only) Modified Rankin (Stroke Patients Only) Pre-Morbid Rankin Score: No symptoms Modified Rankin: No significant disability     Balance Overall balance assessment: Mild deficits observed, not formally tested                                           Pertinent Vitals/Pain Pain Assessment Pain Assessment: No/denies pain    Home Living Family/patient expects to be discharged to:: Private residence Living Arrangements: Spouse/significant other;Children Available Help at Discharge: Family Type of Home: House Home Access: Stairs to enter   Technical brewer of Steps: 2 Alternate Level Stairs-Number of Steps: flight Home Layout: Two level;Laundry or work area in [employer]: BSC/3in1;Shower seat      Prior Function Prior Level of Function : Independent/Modified Independent;Driving             Mobility Comments: no AD use ADLs Comments: ind     Hand Dominance  Dominant Hand: Left    Extremity/Trunk Assessment   Upper Extremity Assessment Upper Extremity Assessment: Defer to OT evaluation    Lower Extremity Assessment Lower Extremity Assessment: RLE deficits/detail;LLE deficits/detail RLE Deficits / Details: Toe deformities LLE Deficits / Details: Toe  deformities    Cervical / Trunk Assessment Cervical / Trunk Assessment: Normal  Communication   Communication: No difficulties  Cognition Arousal/Alertness: Awake/alert Behavior During Therapy: WFL for tasks assessed/performed, Anxious Overall Cognitive Status: Within Functional Limits for tasks assessed                                 General Comments: follows commands and able to provide PLOF appropriately        General Comments General comments (skin integrity, edema, etc.): VSS on RA    Exercises     Assessment/Plan    PT Assessment Patient does not need any further PT services  PT Problem List         PT Treatment Interventions      PT Goals (Current goals can be found in the Care Plan section)  Acute Rehab PT Goals Patient Stated Goal: did not state PT Goal Formulation: All assessment and education complete, DC therapy    Frequency       Co-evaluation               AM-PAC PT "6 Clicks" Mobility  Outcome Measure Help needed turning from your back to your side while in a flat bed without using bedrails?: None Help needed moving from lying on your back to sitting on the side of a flat bed without using bedrails?: None Help needed moving to and from a bed to a chair (including a wheelchair)?: None Help needed standing up from a chair using your arms (e.g., wheelchair or bedside chair)?: None Help needed to walk in hospital room?: A Little Help needed climbing 3-5 steps with a railing? : None 6 Click Score: 23    End of Session   Activity Tolerance: Patient tolerated treatment well Patient left: Other (comment) (in bathroom) Nurse Communication: Mobility status PT Visit Diagnosis: Unsteadiness on feet (R26.81)    Time: [id number] PT Time Calculation (min) (ACUTE ONLY): 18 min   Charges:   PT Evaluation $PT Eval Low Complexity: [place], PT, DPT Acute Rehabilitation Services Office  [phone]   Deno Etienne 10/04/2022, 9:40 AM
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
## 2022-10-04 NOTE — Care Management CC44 (Signed)
Condition Code 44 Documentation Completed  Patient Details  Name: Heather Fitzgerald MRN: [id number] Date of Birth: 1934/04/27   Condition Code 44 given:  Yes Patient signature on Condition Code 44 notice given: yes (verbal)    Amador Cunas, [place] 10/04/2022, 6:28 PM

## 2022-10-04 NOTE — Evaluation (Signed)
Speech Language Pathology Evaluation Patient Details Name: Heather Fitzgerald MRN: [id number] DOB: [birth year] Today's Date: 10/04/2022 Time: [id number] SLP Time Calculation (min) (ACUTE ONLY): 15 min  Problem List:  Patient Active Problem List   Diagnosis Date Noted   Diplopia 10/03/2022   TIA (transient ischemic attack) 10/03/2022   Right knee OA 01/26/2020   Status post total right knee replacement 01/26/2020   Digital mucous cyst 01/21/2018   Postoperative anemia due to acute blood loss 03/29/2014   S/P left hip conversion 03/27/2014   Constipation 05/17/2013   Acute blood loss anemia 05/17/2013   Pain in left hip 05/17/2013   Essential hypertension, benign 05/13/2013   Thrombocytopenia, unspecified ([place]) 05/13/2013   GERD (gastroesophageal reflux disease) 05/13/2013   Intertrochanteric fracture of left hip ([place]) 05/12/2013   Past Medical History:  Past Medical History:  Diagnosis Date   Anxiety    Arthritis    Asthma    hx of    Cancer ([place])    face pre cancerous lesion removed   GERD (gastroesophageal reflux disease)    Headache(784.0)    Hypertension    Thrombocytopenia ([facility])    Past Surgical History:  Past Surgical History:  Procedure Laterality Date   ABDOMINAL HYSTERECTOMY     ANKLE FUSION Left    CONVERSION TO TOTAL HIP Left 03/27/2014   Procedure: CONVERSION OF FAILED LEFT HIP HEMI TO TOTAL HIP ARTHROPLASTY;  Surgeon: Mauri Pole, MD;  Location: [facility];  Service: Orthopedics;  Laterality: Left;   EYE SURGERY     bilateral cataract surgery    GANGLION CYST EXCISION     right knee    HIP ARTHROPLASTY Left 05/13/2013   Procedure: ARTHROPLASTY BIPOLAR HIP;  Surgeon: Augustin Schooling, MD;  Location: [facility];  Service: Orthopedics;  Laterality: Left;   knee cyst surgery      left ankle surgery      due to fracture    OVARIAN CYST SURGERY     TOTAL KNEE ARTHROPLASTY Right 01/26/2020   Procedure: TOTAL KNEE ARTHROPLASTY;  Surgeon: Paralee Cancel, MD;   Location: [facility];  Service: Orthopedics;  Laterality: Right;  70 mins   HPI:  t is an 89 y/o F presenting to ED on 2/23 after episode of double vision that has resolved, and persistent headache. MRI negative. PMH includes anxiety, asthma, GERD, HTN, thrombocytopenia   Assessment / Plan / Recommendation Clinical Impression  Patient is not currently presenting with any speech, language or cognitive impairments as per this evaluation. She is somewhat anxious regarding recent events and since CT and MRI were both negative, she is worried at the possibility it could happen again. She did acknowledge that life has been stressful of late. 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 She did report that she continued to have double vision with only lights, as well as an ongoing headache. She also described fall two weeks ago and said she doesn't know what happened. Patient presenting with good awareness overall. SLP not recommending any f/u at this time.    SLP Assessment  SLP Recommendation/Assessment: Patient does not need any further Speech Lanaguage Pathology Services SLP Visit Diagnosis: Cognitive communication deficit (  R41.841)    Recommendations for follow up therapy are one component of a multi-disciplinary discharge planning process, led by the attending physician.  Recommendations may be updated based on patient status, additional functional criteria and insurance authorization.    Follow Up Recommendations  No SLP follow up    Assistance Recommended at Discharge  None  Functional Status Assessment Patient has not had a recent decline in their functional status  Frequency and Duration     N/A      SLP  Evaluation Cognition  Overall Cognitive Status: Within Functional Limits for tasks assessed Arousal/Alertness: Awake/alert Orientation Level: Oriented X4       Comprehension  Auditory Comprehension Overall Auditory Comprehension: Appears within functional limits for tasks assessed    Expression Expression Primary Mode of Expression: Verbal Verbal Expression Overall Verbal Expression: Appears within functional limits for tasks assessed   Oral / Motor  Oral Motor/Sensory Function Overall Oral Motor/Sensory Function: Within functional limits Motor Speech Overall Motor Speech: Appears within functional limits for tasks assessed Respiration: Within functional limits Resonance: Within functional limits Articulation: Within functional limitis Intelligibility: [place], MA, CCC-SLP Speech Therapy

## 2022-10-04 NOTE — Progress Notes (Signed)
EEG complete - results pending 

## 2022-10-04 NOTE — Evaluation (Signed)
Occupational Therapy Evaluation Patient Details Name: Heather Fitzgerald MRN: [id number] DOB: 02/23/1934 Today's Date: 10/04/2022   History of Present Illness Pt is an 89 y/o F presenting to ED on 2/23 after episode of double vision that has resolved, and persistent headache. MRI negative. PMH includes anxiety, asthma, GERD, HTN, thrombocytopenia   Clinical Impression   Pt reports independence at baseline with ADLs and functional mobility, lives with spouse who she cares for. Pt needing mod I -min guard for ADLs, mod I for bed mobility and min guard for transfers without AD. Pt reporting dizziness initially, however improved once standing for a few mins. Pt reports visual symptoms have resolved, pt educated on [employer] and verbalized understanding. Pt presenting with impairments listed below, however has no acute OT needs at this time, will s/o. Please reconsult if there is a change in pt status.     Recommendations for follow up therapy are one component of a multi-disciplinary discharge planning process, led by the attending physician.  Recommendations may be updated based on patient status, additional functional criteria and insurance authorization.   Follow Up Recommendations  No OT follow up     Assistance Recommended at Discharge Set up Supervision/Assistance  Patient can return home with the following Assist for transportation    Functional Status Assessment  Patient has had a recent decline in their functional status and demonstrates the ability to make significant improvements in function in a reasonable and predictable amount of time.  Equipment Recommendations  None recommended by OT (pt has all needed DME)    Recommendations for Other Services PT consult     Precautions / Restrictions Precautions Precautions: None Restrictions Weight Bearing Restrictions: No      Mobility Bed Mobility Overal bed mobility: Modified Independent             General bed  mobility comments: no physical assist provided, cues to scoot to EOB    Transfers Overall transfer level: Needs assistance Equipment used: None Transfers: Sit to/from Stand Sit to Stand: Min guard                  Balance Overall balance assessment: Mild deficits observed, not formally tested                                         ADL either performed or assessed with clinical judgement   ADL Overall ADL's : Needs assistance/impaired Eating/Feeding: Modified independent   Grooming: Modified independent   Upper Body Bathing: Sitting;Standing;Set up   Lower Body Bathing: Min guard;Sitting/lateral leans;Sit to/from stand   Upper Body Dressing : Sitting;Standing;Set up   Lower Body Dressing: Min guard;Sitting/lateral leans;Sit to/from stand   Toilet Transfer: Min guard;Ambulation;Regular Museum/gallery exhibitions officer and Hygiene: Supervision/safety       Functional mobility during ADLs: Min guard       Vision Baseline Vision/History: 1 Wears glasses (reading only) Vision Assessment?: Yes Eye Alignment: Within Functional Limits Ocular Range of Motion: Within Functional Limits Alignment/Gaze Preference: Within Defined Limits Convergence: Within functional limits Visual Fields: No apparent deficits Additional Comments: pt reports visual symptoms have resolved, able to read short paragraph and perform ADLs without difficutly     Perception Perception Perception Tested?: No   Praxis Praxis Praxis tested?: Not tested    Pertinent Vitals/Pain Pain Assessment Pain Assessment: No/denies pain     Hand Dominance Left  Extremity/Trunk Assessment Upper Extremity Assessment Upper Extremity Assessment: Defer to OT evaluation   Lower Extremity Assessment Lower Extremity Assessment: RLE deficits/detail;LLE deficits/detail RLE Deficits / Details: Toe deformities LLE Deficits / Details: Toe deformities   Cervical / Trunk  Assessment Cervical / Trunk Assessment: Normal   Communication Communication Communication: No difficulties   Cognition Arousal/Alertness: Awake/alert Behavior During Therapy: WFL for tasks assessed/performed, Anxious Overall Cognitive Status: Within Functional Limits for tasks assessed                                 General Comments: follows commands and able to provide PLOF appropriately, anxious regarding events leading to hospital admission     General Comments  VSS on RA    Exercises     Shoulder Instructions      Home Living Family/patient expects to be discharged to:: Private residence Living Arrangements: Spouse/significant other;Children Available Help at Discharge: Family Type of Home: House Home Access: Stairs to enter Technical brewer of Steps: 2   Home Layout: Two level;Laundry or work area in Building surveyor of Steps: flight   Bathroom Shower/Tub: Aeronautical engineer: BSC/3in1;Shower seat          Prior Functioning/Environment Prior Level of Function : Independent/Modified Independent;Driving             Mobility Comments: no AD use ADLs Comments: ind        OT Problem List: Decreased strength;Decreased range of motion      OT Treatment/Interventions:      OT Goals(Current goals can be found in the care plan section) Acute Rehab OT Goals Patient Stated Goal: none stated OT Goal Formulation: With patient Time For Goal Achievement: 10/18/22 Potential to Achieve Goals: Good  OT Frequency:      Co-evaluation              AM-PAC OT "6 Clicks" Daily Activity     Outcome Measure Help from another person eating meals?: None Help from another person taking care of personal grooming?: None Help from another person toileting, which includes using toliet, bedpan, or urinal?: A Little Help from another person bathing (including washing, rinsing, drying)?: A Little Help from  another person to put on and taking off regular upper body clothing?: None Help from another person to put on and taking off regular lower body clothing?: A Little 6 Click Score: 21   End of Session Equipment Utilized During Treatment: Gait belt Nurse Communication: Mobility status  Activity Tolerance: Patient tolerated treatment well Patient left: in bed;with call bell/phone within reach;with bed alarm set  OT Visit Diagnosis: Muscle weakness (generalized) (M62.81)                Time: [id number] OT Time Calculation (min): 24 min Charges:  OT General Charges $OT Visit: 1 Visit OT Evaluation $OT Eval Moderate Complexity: 1 Mod OT Treatments $Self Care/Home Management : 8-22 mins  Renaye Rakers, OTD, OTR/L SecureChat Preferred Acute Rehab [phone]  Adisa Litt K Koonce 10/04/2022, 10:13 AM
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
## 2022-10-04 NOTE — Progress Notes (Signed)
Discharged to home after IV access removed and discharge instructions reviewed with pt and family.  Note from neurology said to not drive until seen by neurology in the office but there was no notes added in her discharge instructions.  Called Dr Avon Gully, who said to call neurology.  Neurology was gone, as this was at the change of shift.  I added instructions to her discharge info and gave a number to call on Monday for a follow up because she said she does all the driving and said she needs to be seen asap.

## 2022-10-06 DIAGNOSIS — H524 Presbyopia: Secondary | ICD-10-CM | POA: Diagnosis not present

## 2022-10-06 DIAGNOSIS — H52203 Unspecified astigmatism, bilateral: Secondary | ICD-10-CM | POA: Diagnosis not present

## 2022-10-06 DIAGNOSIS — H40013 Open angle with borderline findings, low risk, bilateral: Secondary | ICD-10-CM | POA: Diagnosis not present

## 2022-10-06 DIAGNOSIS — H5211 Myopia, right eye: Secondary | ICD-10-CM | POA: Diagnosis not present

## 2022-10-06 DIAGNOSIS — H532 Diplopia: Secondary | ICD-10-CM | POA: Diagnosis not present

## 2022-10-06 DIAGNOSIS — H43813 Vitreous degeneration, bilateral: Secondary | ICD-10-CM | POA: Diagnosis not present

## 2022-10-06 DIAGNOSIS — Z83511 Family history of glaucoma: Secondary | ICD-10-CM | POA: Diagnosis not present

## 2022-10-06 DIAGNOSIS — H35363 Drusen (degenerative) of macula, bilateral: Secondary | ICD-10-CM | POA: Diagnosis not present

## 2022-10-06 DIAGNOSIS — H11153 Pinguecula, bilateral: Secondary | ICD-10-CM | POA: Diagnosis not present

## 2022-10-06 DIAGNOSIS — H04123 Dry eye syndrome of bilateral lacrimal glands: Secondary | ICD-10-CM | POA: Diagnosis not present

## 2022-10-07 DIAGNOSIS — Z09 Encounter for follow-up examination after completed treatment for conditions other than malignant neoplasm: Secondary | ICD-10-CM | POA: Diagnosis not present

## 2022-10-07 DIAGNOSIS — R519 Headache, unspecified: Secondary | ICD-10-CM | POA: Diagnosis not present

## 2022-10-07 DIAGNOSIS — H532 Diplopia: Secondary | ICD-10-CM | POA: Diagnosis not present

## 2022-10-09 DIAGNOSIS — N182 Chronic kidney disease, stage 2 (mild): Secondary | ICD-10-CM | POA: Diagnosis not present

## 2022-10-09 DIAGNOSIS — E782 Mixed hyperlipidemia: Secondary | ICD-10-CM | POA: Diagnosis not present

## 2022-10-09 DIAGNOSIS — I129 Hypertensive chronic kidney disease with stage 1 through stage 4 chronic kidney disease, or unspecified chronic kidney disease: Secondary | ICD-10-CM | POA: Diagnosis not present

## 2022-10-09 DIAGNOSIS — M810 Age-related osteoporosis without current pathological fracture: Secondary | ICD-10-CM | POA: Diagnosis not present

## 2022-10-16 NOTE — Care Management CC44 (Signed)
Condition Code 44 Documentation Completed  Patient Details  Name: Heather Fitzgerald MRN: [id number] Date of Birth: 02-Dec-1933  LATE ENTRY Condition Code 44 was issued to pt verbally on 10/04/22; however, there was an error in saving the document. Documentation is now complete and notification will be mailed to patient's  home address.  Condition Code 44 given:  Yes Patient signature on Condition Code 44 notice:  Yes (verbal)    Amador Cunas, [place] 10/16/2022, 2:39 PM

## 2022-10-22 ENCOUNTER — Ambulatory Visit: Payer: PPO | Admitting: Neurology

## 2022-10-22 ENCOUNTER — Encounter: Payer: Self-pay | Admitting: Neurology

## 2022-10-22 VITALS — BP 178/48 | HR 61 | Ht 62.0 in | Wt 112.0 lb

## 2022-10-22 DIAGNOSIS — G43109 Migraine with aura, not intractable, without status migrainosus: Secondary | ICD-10-CM

## 2022-10-22 MED ORDER — GABAPENTIN 100 MG PO CAPS: 100.0000 mg | ORAL_CAPSULE | Freq: Two times a day (BID) | ORAL | 3 refills | Status: DC

## 2022-10-22 NOTE — Patient Instructions (Signed)
Start Gabapentin 100 mg nightly, can increase to 100 mg twice daily  Continue your other medications  Can try Aleve or Tylenol for the headaches  Follow up in 6 months or sooner if worse
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
## 2022-10-28 ENCOUNTER — Encounter: Payer: Self-pay | Admitting: Neurology

## 2022-11-04 DIAGNOSIS — H9202 Otalgia, left ear: Secondary | ICD-10-CM | POA: Diagnosis not present

## 2022-11-09 DIAGNOSIS — M810 Age-related osteoporosis without current pathological fracture: Secondary | ICD-10-CM | POA: Diagnosis not present

## 2022-11-09 DIAGNOSIS — N182 Chronic kidney disease, stage 2 (mild): Secondary | ICD-10-CM | POA: Diagnosis not present

## 2022-11-09 DIAGNOSIS — E782 Mixed hyperlipidemia: Secondary | ICD-10-CM | POA: Diagnosis not present

## 2022-11-09 DIAGNOSIS — I129 Hypertensive chronic kidney disease with stage 1 through stage 4 chronic kidney disease, or unspecified chronic kidney disease: Secondary | ICD-10-CM | POA: Diagnosis not present

## 2022-11-10 DIAGNOSIS — N959 Unspecified menopausal and perimenopausal disorder: Secondary | ICD-10-CM | POA: Diagnosis not present

## 2022-11-10 DIAGNOSIS — Z01419 Encounter for gynecological examination (general) (routine) without abnormal findings: Secondary | ICD-10-CM | POA: Diagnosis not present

## 2022-11-10 DIAGNOSIS — Z6821 Body mass index (BMI) 21.0-21.9, adult: Secondary | ICD-10-CM | POA: Diagnosis not present

## 2022-11-10 DIAGNOSIS — Z1231 Encounter for screening mammogram for malignant neoplasm of breast: Secondary | ICD-10-CM | POA: Diagnosis not present

## 2022-11-10 DIAGNOSIS — N952 Postmenopausal atrophic vaginitis: Secondary | ICD-10-CM | POA: Diagnosis not present

## 2022-12-09 DIAGNOSIS — I129 Hypertensive chronic kidney disease with stage 1 through stage 4 chronic kidney disease, or unspecified chronic kidney disease: Secondary | ICD-10-CM | POA: Diagnosis not present

## 2022-12-09 DIAGNOSIS — M810 Age-related osteoporosis without current pathological fracture: Secondary | ICD-10-CM | POA: Diagnosis not present

## 2022-12-09 DIAGNOSIS — E782 Mixed hyperlipidemia: Secondary | ICD-10-CM | POA: Diagnosis not present

## 2022-12-09 DIAGNOSIS — N182 Chronic kidney disease, stage 2 (mild): Secondary | ICD-10-CM | POA: Diagnosis not present

## 2022-12-23 DIAGNOSIS — M25511 Pain in right shoulder: Secondary | ICD-10-CM | POA: Diagnosis not present

## 2022-12-23 DIAGNOSIS — M79644 Pain in right finger(s): Secondary | ICD-10-CM | POA: Diagnosis not present

## 2022-12-24 DIAGNOSIS — S6991XA Unspecified injury of right wrist, hand and finger(s), initial encounter: Secondary | ICD-10-CM | POA: Diagnosis not present

## 2023-01-08 ENCOUNTER — Encounter: Payer: Self-pay | Admitting: Cardiology

## 2023-01-08 DIAGNOSIS — M79644 Pain in right finger(s): Secondary | ICD-10-CM | POA: Diagnosis not present
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
## 2023-01-09 ENCOUNTER — Ambulatory Visit: Payer: PPO | Attending: Cardiology | Admitting: Cardiology

## 2023-01-09 ENCOUNTER — Encounter: Payer: Self-pay | Admitting: Cardiology

## 2023-01-09 VITALS — BP 154/72 | HR 57 | Ht 62.0 in | Wt 112.8 lb

## 2023-01-09 DIAGNOSIS — R55 Syncope and collapse: Secondary | ICD-10-CM | POA: Diagnosis not present

## 2023-01-09 DIAGNOSIS — I10 Essential (primary) hypertension: Secondary | ICD-10-CM

## 2023-01-09 NOTE — Patient Instructions (Signed)
Medication Instructions:  Continue same medications   Lab Work: Bmet,tsh    Testing/Procedures: Event monitor   Follow-Up: At [facility], you and your health needs are our priority.  As part of our continuing mission to provide you with exceptional heart care, we have created designated Provider Care Teams.  These Care Teams include your primary Cardiologist (physician) and Advanced Practice Providers (APPs -  Physician Assistants and Nurse Practitioners) who all work together to provide you with the care you need, when you need it.  We recommend signing up for the patient portal called "MyChart".  Sign up information is provided on this After Visit Summary.  MyChart is used to connect with patients for Virtual Visits (Telemedicine).  Patients are able to view lab/test results, encounter notes, upcoming appointments, etc.  Non-urgent messages can be sent to your provider as well.   To learn more about what you can do with MyChart, go to [email or web address].    Your next appointment:  As Needed    Provider:  Dr.Hochrein

## 2023-01-10 LAB — BASIC METABOLIC PANEL
BUN/Creatinine Ratio: 20 (ref 12–28)
BUN: 17 mg/dL (ref 8–27)
CO2: 28 mmol/L (ref 20–29)
Calcium: 9.3 mg/dL (ref 8.7–10.3)
Chloride: 102 mmol/L (ref 96–106)
Creatinine, Ser: 0.85 mg/dL (ref 0.57–1.00)
Glucose: 82 mg/dL (ref 70–99)
Potassium: 3.3 mmol/L — ABNORMAL LOW (ref 3.5–5.2)
Sodium: 143 mmol/L (ref 134–144)
eGFR: 67 mL/min/{1.73_m2} (ref >59)

## 2023-01-10 LAB — TSH: TSH: 2.92 u[IU]/mL (ref 0.450–4.500)

## 2023-01-15 ENCOUNTER — Telehealth: Payer: Self-pay | Admitting: *Deleted

## 2023-01-15 ENCOUNTER — Ambulatory Visit: Payer: PPO | Attending: Cardiology

## 2023-01-15 DIAGNOSIS — R55 Syncope and collapse: Secondary | ICD-10-CM | POA: Diagnosis not present

## 2023-01-15 DIAGNOSIS — I10 Essential (primary) hypertension: Secondary | ICD-10-CM

## 2023-01-15 DIAGNOSIS — E876 Hypokalemia: Secondary | ICD-10-CM

## 2023-01-15 MED ORDER — POTASSIUM CHLORIDE CRYS ER 20 MEQ PO TBCR: 40.0000 meq | EXTENDED_RELEASE_TABLET | Freq: Every day | ORAL | 3 refills | Status: DC

## 2023-01-15 NOTE — Telephone Encounter (Signed)
-----   Message from Rollene Rotunda, MD sent at 01/11/2023  2:27 PM EDT ----- Potassium is mildly reduced.  I would like to start 40 meq po daily.  I would like to repeat a BMET in 10 days.  Call Ms. Ray-Spangler with the results and send results to Georgianne Fick, MD

## 2023-01-15 NOTE — Telephone Encounter (Signed)
Spoke with pt, aware of lab results. New script sent to the pharmacy  Order placed for labs to be drawn Friday

## 2023-01-15 NOTE — Progress Notes (Unsigned)
Monitor mailed 01/09/23 and applied in office 01/15/23.  Serial # [id number]

## 2023-01-22 DIAGNOSIS — M79644 Pain in right finger(s): Secondary | ICD-10-CM | POA: Diagnosis not present

## 2023-02-06 DIAGNOSIS — Z96642 Presence of left artificial hip joint: Secondary | ICD-10-CM | POA: Diagnosis not present

## 2023-02-06 DIAGNOSIS — M7062 Trochanteric bursitis, left hip: Secondary | ICD-10-CM | POA: Diagnosis not present

## 2023-02-10 ENCOUNTER — Encounter: Payer: Self-pay | Admitting: *Deleted

## 2023-02-16 DIAGNOSIS — E876 Hypokalemia: Secondary | ICD-10-CM | POA: Diagnosis not present

## 2023-02-17 DIAGNOSIS — I10 Essential (primary) hypertension: Secondary | ICD-10-CM | POA: Diagnosis not present

## 2023-02-17 LAB — BASIC METABOLIC PANEL
BUN/Creatinine Ratio: 27 (ref 12–28)
BUN: 20 mg/dL (ref 8–27)
CO2: 27 mmol/L (ref 20–29)
Calcium: 9.1 mg/dL (ref 8.7–10.3)
Chloride: 101 mmol/L (ref 96–106)
Creatinine, Ser: 0.75 mg/dL (ref 0.57–1.00)
Glucose: 108 mg/dL — ABNORMAL HIGH (ref 70–99)
Potassium: 4 mmol/L (ref 3.5–5.2)
Sodium: 140 mmol/L (ref 134–144)
eGFR: 77 mL/min/{1.73_m2} (ref >59)

## 2023-02-17 LAB — SPECIMEN STATUS REPORT

## 2023-02-19 DIAGNOSIS — M79644 Pain in right finger(s): Secondary | ICD-10-CM | POA: Diagnosis not present

## 2023-02-24 ENCOUNTER — Encounter: Payer: Self-pay | Admitting: *Deleted

## 2023-02-25 ENCOUNTER — Telehealth: Payer: Self-pay | Admitting: Cardiology

## 2023-02-25 DIAGNOSIS — E876 Hypokalemia: Secondary | ICD-10-CM

## 2023-02-25 MED ORDER — POTASSIUM CHLORIDE CRYS ER 10 MEQ PO TBCR: 40.0000 meq | EXTENDED_RELEASE_TABLET | Freq: Every day | ORAL | 3 refills | Status: DC

## 2023-02-25 NOTE — Telephone Encounter (Signed)
Pt c/o medication issue:  1. Name of Medication:   potassium chloride SA (KLOR-CON M) 20 MEQ tablet    2. How are you currently taking this medication (dosage and times per day)? Take 2 tablets (40 mEq total) by mouth daily.   3. Are you having a reaction (difficulty breathing--STAT)? No  4. What is your medication issue? Patient would like to know if she needs to continue taking this medication. Patient states when she had lab work completed on 07/08 her potassium levels were at 4. Patient stated that if she was to continue taking this medication she will need a refill. Patient is requesting if we refill this medication, she would like [place] to prescribe instead of the 20 MEG. Patient states the tablets are too big and it is hard for her to take the medication.

## 2023-02-25 NOTE — Telephone Encounter (Signed)
Left message for patient, she needs to continue the potassium and a new script for the 10 meq size tablet has been sent to the pharmacy.

## 2023-03-04 DIAGNOSIS — M25552 Pain in left hip: Secondary | ICD-10-CM | POA: Diagnosis not present

## 2023-04-07 DIAGNOSIS — I739 Peripheral vascular disease, unspecified: Secondary | ICD-10-CM | POA: Diagnosis not present

## 2023-04-07 DIAGNOSIS — I10 Essential (primary) hypertension: Secondary | ICD-10-CM | POA: Diagnosis not present

## 2023-04-07 DIAGNOSIS — R5383 Other fatigue: Secondary | ICD-10-CM | POA: Diagnosis not present

## 2023-04-07 DIAGNOSIS — E782 Mixed hyperlipidemia: Secondary | ICD-10-CM | POA: Diagnosis not present

## 2023-04-07 DIAGNOSIS — N182 Chronic kidney disease, stage 2 (mild): Secondary | ICD-10-CM | POA: Diagnosis not present

## 2023-04-07 DIAGNOSIS — D692 Other nonthrombocytopenic purpura: Secondary | ICD-10-CM | POA: Diagnosis not present

## 2023-04-08 DIAGNOSIS — N182 Chronic kidney disease, stage 2 (mild): Secondary | ICD-10-CM | POA: Diagnosis not present

## 2023-04-08 DIAGNOSIS — R5383 Other fatigue: Secondary | ICD-10-CM | POA: Diagnosis not present

## 2023-04-08 DIAGNOSIS — I739 Peripheral vascular disease, unspecified: Secondary | ICD-10-CM | POA: Diagnosis not present

## 2023-04-08 DIAGNOSIS — I10 Essential (primary) hypertension: Secondary | ICD-10-CM | POA: Diagnosis not present

## 2023-04-08 DIAGNOSIS — E782 Mixed hyperlipidemia: Secondary | ICD-10-CM | POA: Diagnosis not present

## 2023-04-08 DIAGNOSIS — D692 Other nonthrombocytopenic purpura: Secondary | ICD-10-CM | POA: Diagnosis not present

## 2023-04-14 DIAGNOSIS — I8312 Varicose veins of left lower extremity with inflammation: Secondary | ICD-10-CM | POA: Diagnosis not present

## 2023-04-14 DIAGNOSIS — N182 Chronic kidney disease, stage 2 (mild): Secondary | ICD-10-CM | POA: Diagnosis not present

## 2023-04-14 DIAGNOSIS — K589 Irritable bowel syndrome without diarrhea: Secondary | ICD-10-CM | POA: Diagnosis not present

## 2023-04-14 DIAGNOSIS — E782 Mixed hyperlipidemia: Secondary | ICD-10-CM | POA: Diagnosis not present

## 2023-04-14 DIAGNOSIS — I10 Essential (primary) hypertension: Secondary | ICD-10-CM | POA: Diagnosis not present

## 2023-04-14 DIAGNOSIS — N760 Acute vaginitis: Secondary | ICD-10-CM | POA: Diagnosis not present

## 2023-04-14 DIAGNOSIS — F419 Anxiety disorder, unspecified: Secondary | ICD-10-CM | POA: Diagnosis not present

## 2023-04-14 DIAGNOSIS — D692 Other nonthrombocytopenic purpura: Secondary | ICD-10-CM | POA: Diagnosis not present

## 2023-04-14 DIAGNOSIS — I739 Peripheral vascular disease, unspecified: Secondary | ICD-10-CM | POA: Diagnosis not present

## 2023-04-14 DIAGNOSIS — M810 Age-related osteoporosis without current pathological fracture: Secondary | ICD-10-CM | POA: Diagnosis not present

## 2023-04-14 DIAGNOSIS — N952 Postmenopausal atrophic vaginitis: Secondary | ICD-10-CM | POA: Diagnosis not present

## 2023-04-15 DIAGNOSIS — M810 Age-related osteoporosis without current pathological fracture: Secondary | ICD-10-CM | POA: Diagnosis not present

## 2023-04-15 DIAGNOSIS — D696 Thrombocytopenia, unspecified: Secondary | ICD-10-CM | POA: Diagnosis not present

## 2023-04-15 DIAGNOSIS — D692 Other nonthrombocytopenic purpura: Secondary | ICD-10-CM | POA: Diagnosis not present

## 2023-04-15 DIAGNOSIS — F3342 Major depressive disorder, recurrent, in full remission: Secondary | ICD-10-CM | POA: Diagnosis not present

## 2023-05-06 ENCOUNTER — Ambulatory Visit (INDEPENDENT_AMBULATORY_CARE_PROVIDER_SITE_OTHER): Payer: PPO | Admitting: Neurology

## 2023-05-06 ENCOUNTER — Encounter: Payer: Self-pay | Admitting: Neurology

## 2023-05-06 VITALS — BP 136/70 | Ht 62.0 in | Wt 114.0 lb

## 2023-05-06 DIAGNOSIS — G43109 Migraine with aura, not intractable, without status migrainosus: Secondary | ICD-10-CM | POA: Diagnosis not present
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
## 2023-05-06 NOTE — Patient Instructions (Signed)
Continue with current medications Continue with Tylenol or Advil as needed for the headaches Return as needed.

## 2023-05-19 DIAGNOSIS — Z23 Encounter for immunization: Secondary | ICD-10-CM | POA: Diagnosis not present

## 2023-05-25 DIAGNOSIS — M79644 Pain in right finger(s): Secondary | ICD-10-CM | POA: Diagnosis not present

## 2023-10-06 DIAGNOSIS — I739 Peripheral vascular disease, unspecified: Secondary | ICD-10-CM | POA: Diagnosis not present

## 2023-10-06 DIAGNOSIS — R5383 Other fatigue: Secondary | ICD-10-CM | POA: Diagnosis not present

## 2023-10-06 DIAGNOSIS — K589 Irritable bowel syndrome without diarrhea: Secondary | ICD-10-CM | POA: Diagnosis not present

## 2023-10-06 DIAGNOSIS — E782 Mixed hyperlipidemia: Secondary | ICD-10-CM | POA: Diagnosis not present

## 2023-10-06 DIAGNOSIS — I10 Essential (primary) hypertension: Secondary | ICD-10-CM | POA: Diagnosis not present

## 2023-10-06 DIAGNOSIS — M810 Age-related osteoporosis without current pathological fracture: Secondary | ICD-10-CM | POA: Diagnosis not present

## 2023-10-06 DIAGNOSIS — F419 Anxiety disorder, unspecified: Secondary | ICD-10-CM | POA: Diagnosis not present

## 2023-10-06 DIAGNOSIS — I8312 Varicose veins of left lower extremity with inflammation: Secondary | ICD-10-CM | POA: Diagnosis not present

## 2023-10-06 DIAGNOSIS — D692 Other nonthrombocytopenic purpura: Secondary | ICD-10-CM | POA: Diagnosis not present

## 2023-10-06 DIAGNOSIS — N182 Chronic kidney disease, stage 2 (mild): Secondary | ICD-10-CM | POA: Diagnosis not present

## 2023-10-13 DIAGNOSIS — Z0000 Encounter for general adult medical examination without abnormal findings: Secondary | ICD-10-CM | POA: Diagnosis not present

## 2023-10-13 DIAGNOSIS — K589 Irritable bowel syndrome without diarrhea: Secondary | ICD-10-CM | POA: Diagnosis not present

## 2023-10-13 DIAGNOSIS — D692 Other nonthrombocytopenic purpura: Secondary | ICD-10-CM | POA: Diagnosis not present

## 2023-10-13 DIAGNOSIS — I739 Peripheral vascular disease, unspecified: Secondary | ICD-10-CM | POA: Diagnosis not present

## 2023-10-13 DIAGNOSIS — N182 Chronic kidney disease, stage 2 (mild): Secondary | ICD-10-CM | POA: Diagnosis not present

## 2023-10-13 DIAGNOSIS — E782 Mixed hyperlipidemia: Secondary | ICD-10-CM | POA: Diagnosis not present

## 2023-10-13 DIAGNOSIS — I10 Essential (primary) hypertension: Secondary | ICD-10-CM | POA: Diagnosis not present

## 2023-10-13 DIAGNOSIS — F419 Anxiety disorder, unspecified: Secondary | ICD-10-CM | POA: Diagnosis not present

## 2023-10-13 DIAGNOSIS — I8312 Varicose veins of left lower extremity with inflammation: Secondary | ICD-10-CM | POA: Diagnosis not present

## 2023-10-13 DIAGNOSIS — M810 Age-related osteoporosis without current pathological fracture: Secondary | ICD-10-CM | POA: Diagnosis not present

## 2023-10-20 DIAGNOSIS — M810 Age-related osteoporosis without current pathological fracture: Secondary | ICD-10-CM | POA: Diagnosis not present

## 2023-11-12 DIAGNOSIS — H40003 Preglaucoma, unspecified, bilateral: Secondary | ICD-10-CM | POA: Diagnosis not present

## 2023-11-12 DIAGNOSIS — H02831 Dermatochalasis of right upper eyelid: Secondary | ICD-10-CM | POA: Diagnosis not present

## 2023-11-12 DIAGNOSIS — H04123 Dry eye syndrome of bilateral lacrimal glands: Secondary | ICD-10-CM | POA: Diagnosis not present

## 2023-11-12 DIAGNOSIS — Z83511 Family history of glaucoma: Secondary | ICD-10-CM | POA: Diagnosis not present

## 2023-11-12 DIAGNOSIS — H35363 Drusen (degenerative) of macula, bilateral: Secondary | ICD-10-CM | POA: Diagnosis not present

## 2023-11-12 DIAGNOSIS — H524 Presbyopia: Secondary | ICD-10-CM | POA: Diagnosis not present

## 2023-11-12 DIAGNOSIS — H02834 Dermatochalasis of left upper eyelid: Secondary | ICD-10-CM | POA: Diagnosis not present

## 2023-11-12 DIAGNOSIS — H52203 Unspecified astigmatism, bilateral: Secondary | ICD-10-CM | POA: Diagnosis not present

## 2023-11-12 DIAGNOSIS — H11153 Pinguecula, bilateral: Secondary | ICD-10-CM | POA: Diagnosis not present

## 2023-11-18 DIAGNOSIS — Z01419 Encounter for gynecological examination (general) (routine) without abnormal findings: Secondary | ICD-10-CM | POA: Diagnosis not present

## 2023-11-18 DIAGNOSIS — Z1231 Encounter for screening mammogram for malignant neoplasm of breast: Secondary | ICD-10-CM | POA: Diagnosis not present

## 2023-11-18 DIAGNOSIS — N952 Postmenopausal atrophic vaginitis: Secondary | ICD-10-CM | POA: Diagnosis not present

## 2023-11-18 DIAGNOSIS — N959 Unspecified menopausal and perimenopausal disorder: Secondary | ICD-10-CM | POA: Diagnosis not present

## 2023-11-18 DIAGNOSIS — Z6820 Body mass index (BMI) 20.0-20.9, adult: Secondary | ICD-10-CM | POA: Diagnosis not present

## 2023-12-08 DIAGNOSIS — R350 Frequency of micturition: Secondary | ICD-10-CM | POA: Diagnosis not present

## 2023-12-09 DIAGNOSIS — M7062 Trochanteric bursitis, left hip: Secondary | ICD-10-CM | POA: Diagnosis not present

## 2024-01-15 ENCOUNTER — Encounter: Payer: Self-pay | Admitting: Student in an Organized Health Care Education/Training Program

## 2024-01-15 ENCOUNTER — Ambulatory Visit (INDEPENDENT_AMBULATORY_CARE_PROVIDER_SITE_OTHER): Admitting: Student in an Organized Health Care Education/Training Program

## 2024-01-15 VITALS — BP 153/49 | HR 63 | Wt 104.0 lb

## 2024-01-15 DIAGNOSIS — F339 Major depressive disorder, recurrent, unspecified: Secondary | ICD-10-CM | POA: Insufficient documentation

## 2024-01-15 DIAGNOSIS — R636 Underweight: Secondary | ICD-10-CM | POA: Insufficient documentation

## 2024-01-15 DIAGNOSIS — K589 Irritable bowel syndrome without diarrhea: Secondary | ICD-10-CM

## 2024-01-15 DIAGNOSIS — I10 Essential (primary) hypertension: Secondary | ICD-10-CM
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
## 2024-01-15 NOTE — Assessment & Plan Note (Signed)
 Mild symptom burden's, little worsening over the last few months.  Likely as a result of external stress, a little worsening in her mood disorder.  Will continue to monitor this, I do not think we need to change medications right now.

## 2024-01-15 NOTE — Assessment & Plan Note (Signed)
 Normal chronic issue.  Blood pressure today 153/43.  Wide pulse pressure.  Low diastolic pressure.  On amlodipine  2.5 mg daily.  No history of ischemic events.  Given her very advanced age and frailty on exam, I am not can make a change antihypertensives right now.

## 2024-01-15 NOTE — Assessment & Plan Note (Signed)
 Chronic issue.  Medication assisted treatment with buspirone  7.5 mg twice daily for many years and seems to be doing well with this.  MN obtain records from her last primary care physician.  On very rare occasion every few months she uses a 0.25 mg of Xanax , reports it has been about 4 months since she has used that medicine.  Having a lot more pressure right now, she is the primary caretaker of her 88 year old husband.  I do not think we need to do a medication adjustment right now, but we talked about the burdens of caretaker stress.

## 2024-01-15 NOTE — Assessment & Plan Note (Signed)
 Worsening issue.  Weight today 104 pounds with a BMI of 19.  She has had a gradual weight loss over the last year.  I suspect its mostly due to stress and mood issues.  She is only eating 1 and sometimes 2 meals a day.  We talked about the importance of regular meal planning.  No other acute symptoms to suggest an organic cause of this, really I think it is from under eating.

## 2024-03-08 ENCOUNTER — Other Ambulatory Visit: Payer: Self-pay | Admitting: Student in an Organized Health Care Education/Training Program

## 2024-03-08 DIAGNOSIS — I10 Essential (primary) hypertension: Secondary | ICD-10-CM | POA: Diagnosis not present

## 2024-03-08 DIAGNOSIS — M1990 Unspecified osteoarthritis, unspecified site: Secondary | ICD-10-CM | POA: Diagnosis not present

## 2024-03-08 DIAGNOSIS — M8580 Other specified disorders of bone density and structure, unspecified site: Secondary | ICD-10-CM | POA: Diagnosis not present

## 2024-03-08 DIAGNOSIS — K589 Irritable bowel syndrome without diarrhea: Secondary | ICD-10-CM | POA: Diagnosis not present

## 2024-03-08 DIAGNOSIS — H9193 Unspecified hearing loss, bilateral: Secondary | ICD-10-CM | POA: Diagnosis not present

## 2024-03-08 DIAGNOSIS — G43109 Migraine with aura, not intractable, without status migrainosus: Secondary | ICD-10-CM | POA: Diagnosis not present

## 2024-03-08 DIAGNOSIS — J45909 Unspecified asthma, uncomplicated: Secondary | ICD-10-CM | POA: Diagnosis not present

## 2024-03-08 DIAGNOSIS — E785 Hyperlipidemia, unspecified: Secondary | ICD-10-CM | POA: Diagnosis not present

## 2024-03-08 DIAGNOSIS — K219 Gastro-esophageal reflux disease without esophagitis: Secondary | ICD-10-CM | POA: Diagnosis not present

## 2024-03-08 DIAGNOSIS — G8929 Other chronic pain: Secondary | ICD-10-CM | POA: Diagnosis not present

## 2024-03-08 DIAGNOSIS — F419 Anxiety disorder, unspecified: Secondary | ICD-10-CM | POA: Diagnosis not present

## 2024-03-08 DIAGNOSIS — F3341 Major depressive disorder, recurrent, in partial remission: Secondary | ICD-10-CM | POA: Diagnosis not present

## 2024-03-08 NOTE — Telephone Encounter (Signed)
 Requested Prescriptions   Pending Prescriptions Disp Refills   busPIRone  (BUSPAR ) 7.5 MG tablet      Sig: Take 1 tablet (7.5 mg total) by mouth 2 (two) times daily.     Date of patient request: 03/08/2024 Last office visit: 01/15/2024 Upcoming visit: 04/19/2024 Date of last refill: 10/03/2022 Last refill amount:    Pt asking to fill this Rx okay to send under your name?

## 2024-03-08 NOTE — Telephone Encounter (Unsigned)
 Copied from CRM [id number]. Topic: Clinical - Medication Refill >> Mar 08, 2024  2:00 PM Paige D wrote: Medication:  busPIRone  (BUSPAR ) 7.5 MG tablet    Has the patient contacted their pharmacy? NO (Agent: If no, request that the patient contact the pharmacy for the refill. If patient does not wish to contact the pharmacy document the reason why and proceed with request.) (Agent: If yes, when and what did the pharmacy advise?)  This is the patient's preferred pharmacy:  [facility] # [id number], [place] - [address] [address] [place] [address] Phone: [phone] Fax: [phone]   Is this the correct pharmacy for this prescription? Yes If no, delete pharmacy and type the correct one.   Has the prescription been filled recently? Yes  Is the patient out of the medication? Yes  Has the patient been seen for an appointment in the last year OR does the patient have an upcoming appointment? Yes  Can we respond through MyChart? Yes  Agent: Please be advised that Rx refills may take up to 3 business days. We ask that you follow-up with your pharmacy.

## 2024-03-09 MED ORDER — BUSPIRONE HCL 7.5 MG PO TABS: 7.5000 mg | ORAL_TABLET | Freq: Two times a day (BID) | ORAL | 2 refills | Status: DC

## 2024-03-21 DIAGNOSIS — N898 Other specified noninflammatory disorders of vagina: Secondary | ICD-10-CM | POA: Diagnosis not present

## 2024-03-21 DIAGNOSIS — R3915 Urgency of urination: Secondary | ICD-10-CM | POA: Diagnosis not present

## 2024-04-18 ENCOUNTER — Other Ambulatory Visit: Payer: Self-pay | Admitting: Student in an Organized Health Care Education/Training Program

## 2024-04-18 MED ORDER — AMLODIPINE BESYLATE 2.5 MG PO TABS: 2.5000 mg | ORAL_TABLET | Freq: Every day | ORAL | 3 refills | Status: DC

## 2024-04-18 NOTE — Telephone Encounter (Signed)
 Patient is requesting refill. Okay to send under your name?

## 2024-04-18 NOTE — Telephone Encounter (Signed)
 Copied from CRM [id number]. Topic: Clinical - Medication Refill >> Apr 18, 2024 10:55 AM [place] M wrote: Medication: amLODipine  (NORVASC ) 2.5 MG tablet  Has the patient contacted their pharmacy? Yes (Agent: If no, request that the patient contact the pharmacy for the refill. If patient does not wish to contact the pharmacy document the reason why and proceed with request.) (Agent: If yes, when and what did the pharmacy advise?)  This is the patient's preferred pharmacy:  [facility] # [address] [place] - [address] [place] [address] Phone: [phone] Fax: [phone]   Is this the correct pharmacy for this prescription? Yes If no, delete pharmacy and type the correct one.   Has the prescription been filled recently? No  Is the patient out of the medication? Yes  Has the patient been seen for an appointment in the last year OR does the patient have an upcoming appointment? Yes  Can we respond through MyChart? Yes  Agent: Please be advised that Rx refills may take up to 3 business days. We ask that you follow-up with your pharmacy.

## 2024-04-19 ENCOUNTER — Encounter: Payer: Self-pay | Admitting: Student in an Organized Health Care Education/Training Program

## 2024-04-19 ENCOUNTER — Ambulatory Visit: Admitting: Student in an Organized Health Care Education/Training Program

## 2024-04-19 VITALS — BP 157/61 | HR 60 | Wt 108.0 lb

## 2024-04-19 DIAGNOSIS — F339 Major depressive disorder, recurrent, unspecified: Secondary | ICD-10-CM | POA: Diagnosis not present

## 2024-04-19 DIAGNOSIS — R636 Underweight: Secondary | ICD-10-CM

## 2024-04-19 DIAGNOSIS — I10 Essential (primary) hypertension: Secondary | ICD-10-CM | POA: Diagnosis not present

## 2024-04-19 MED ORDER — AMLODIPINE BESYLATE 5 MG PO TABS: 5.0000 mg | ORAL_TABLET | Freq: Every day | ORAL | 3 refills | Status: DC

## 2024-04-19 NOTE — Assessment & Plan Note (Signed)
 Mood is stable, but she experiences significant stress due to her husband's health and life events. Currently on buspirone  7.5 mg twice daily. Sleep is adequate. Monitor mood and consider medication adjustment if depression worsens or interferes with daily life. Follow up in 3 months to reassess mood.  I recommended adding secondary medication something like mirtazapine which might help with her appetite, but she declines medication changes right now.

## 2024-04-19 NOTE — Assessment & Plan Note (Signed)
 Blood pressure is elevated at 157/61 mmHg. Currently taking amlodipine  2.5 mg daily. Increase amlodipine  to 5 mg daily and send a new prescription to the pharmacy. Follow up in 3 months to reassess blood pressure.

## 2024-04-19 NOTE — Assessment & Plan Note (Signed)
 Weight has increased from 104 to 108 pounds over three months. She is eating two meals a day. Follow up in 3 months to reassess weight.  I wonder if adding mirtazapine might help with her poor appetite.

## 2024-04-19 NOTE — Progress Notes (Signed)
   Established Patient Office Visit  Subjective   Patient ID: Heather Fitzgerald, female    DOB: [birth year]  Age: 89 y.o. MRN: [id number]  Chief Complaint  Patient presents with   Medical Management of Chronic Issues    3 month follow up     HPI  Discussed the use of AI scribe software for clinical note transcription with the patient, who gave verbal consent to proceed.  History of Present Illness Heather Fitzgerald is an 89 year old female with hypertension who presents with elevated blood pressure and fatigue.  Her blood pressure today was 157/61 mmHg. At home, her blood pressure fluctuates between the 120s and 140s, but it has been lower recently. No lightheadedness or dizziness, but she has headaches. She had a fall about a month ago, hitting her head against the bedpost without serious injury. She attributes balance issues to her deformed toes, which have been problematic since she broke her foot and had a bunion.  She feels tired and stressed, primarily due to her husband's hospitalization. Although her husband's daughter has moved in and assists when needed, she has not been receiving much help. Her mood is described as 'okay,' and she finds comfort in her therapy dog. She is currently on mirtazapine and buspirone , taking 7.5 mg twice a day, which she feels helps manage her mood. She sleeps well, although sometimes goes to bed late.  She has gained four pounds over the past three months, with her current weight at 108 pounds, eating two meals a day. She receives a yearly calcium  infusion, Reclast , which was delayed this year due to insurance changes, and she received it in March instead of January.     Objective:     BP (!) 157/61   Pulse 60   Wt 108 lb (49 kg)   BMI 19.75 kg/m   Physical Exam  Gen: Well-appearing, high frailty advanced age Heart: Regular, no murmur Lungs; unlabored, clear throughout Ext: Warm, no edema, hammertoe deformities of both feet Psych:  Mildly depressed appearing, not anxious appearing, normal speech and organized thoughts    Assessment & Plan:   Problem List Items Addressed This Visit       High   Underweight (Chronic)   Weight has increased from 104 to 108 pounds over three months. She is eating two meals a day. Follow up in 3 months to reassess weight.  I wonder if adding mirtazapine might help with her poor appetite.      Depression, recurrent (HCC) (Chronic)   Mood is stable, but she experiences significant stress due to her husband's health and life events. Currently on buspirone  7.5 mg twice daily. Sleep is adequate. Monitor mood and consider medication adjustment if depression worsens or interferes with daily life. Follow up in 3 months to reassess mood.  I recommended adding secondary medication something like mirtazapine which might help with her appetite, but she declines medication changes right now.        Medium    Essential hypertension, benign - Primary (Chronic)   Blood pressure is elevated at 157/61 mmHg. Currently taking amlodipine  2.5 mg daily. Increase amlodipine  to 5 mg daily and send a new prescription to the pharmacy. Follow up in 3 months to reassess blood pressure.      Relevant Medications   amLODipine  (NORVASC ) 5 MG tablet    Return in about 3 months (around 07/19/2024).    Cleatus Debby Specking, MD

## 2024-05-24 ENCOUNTER — Other Ambulatory Visit: Payer: Self-pay | Admitting: Student in an Organized Health Care Education/Training Program

## 2024-05-24 NOTE — Telephone Encounter (Signed)
Okay to fill under your name? 

## 2024-05-24 NOTE — Telephone Encounter (Signed)
 Copied from CRM [id number]. Topic: Clinical - Medication Refill >> May 24, 2024 12:57 PM Aleatha C wrote: Medication: atorvastatin  (LIPITOR ) 10 MG tablet  Has the patient contacted their pharmacy? No (Agent: If no, request that the patient contact the pharmacy for the refill. If patient does not wish to contact the pharmacy document the reason why and proceed with request.) (Agent: If yes, when and what did the pharmacy advise?)  This is the patient's preferred pharmacy:  [facility] [address] [place] - [address] [place] [address] Phone: [phone] Fax: [phone]   Is this the correct pharmacy for this prescription? Yes If no, delete pharmacy and type the correct one.   Has the prescription been filled recently? No  Is the patient out of the medication? Yes  Has the patient been seen for an appointment in the last year OR does the patient have an upcoming appointment? Yes  Can we respond through MyChart? No  Agent: Please be advised that Rx refills may take up to 3 business days. We ask that you follow-up with your pharmacy.

## 2024-05-25 MED ORDER — ATORVASTATIN CALCIUM 10 MG PO TABS: 10.0000 mg | ORAL_TABLET | Freq: Every day | ORAL | 3 refills | Status: AC

## 2024-06-08 ENCOUNTER — Ambulatory Visit (INDEPENDENT_AMBULATORY_CARE_PROVIDER_SITE_OTHER)

## 2024-06-08 VITALS — Wt 108.0 lb

## 2024-06-08 DIAGNOSIS — Z0000 Encounter for general adult medical examination without abnormal findings: Secondary | ICD-10-CM | POA: Diagnosis not present

## 2024-06-08 NOTE — Progress Notes (Signed)
 Subjective:   Heather Fitzgerald is a 89 y.o. who presents for a Medicare Wellness preventive visit.  As a reminder, Annual Wellness Visits don't include a physical exam, and some assessments may be limited, especially if this visit is performed virtually. We may recommend an in-person follow-up visit with your provider if needed.  Visit Complete: Virtual I connected with  Heather Fitzgerald on 06/08/24 by a video and audio enabled telemedicine application and verified that I am speaking with the correct person using two identifiers.  Patient Location: Home  Provider Location: Home Office  I discussed the limitations of evaluation and management by telemedicine. The patient expressed understanding and agreed to proceed.  Vital Signs: Because this visit was a virtual/telehealth visit, some criteria may be missing or patient reported. Any vitals not documented were not able to be obtained and vitals that have been documented are patient reported.  Persons Participating in Visit: Patient.  AWV Questionnaire: No: Patient Medicare AWV questionnaire was not completed prior to this visit.  Cardiac Risk Factors include: advanced age (>66men, >11 women);hypertension     Objective:    There were no vitals filed for this visit. There is no height or weight on file to calculate BMI.     06/08/2024    4:02 PM 10/03/2022   10:48 PM 10/03/2022    9:40 AM 08/29/2022    1:17 PM 08/05/2020    8:43 PM 01/26/2020    2:00 PM 01/13/2020    2:14 PM  Advanced Directives  Does Patient Have a Medical Advance Directive? Yes Yes Yes Yes No Yes Yes  Type of Estate Agent of [place];Living will Healthcare Power of [employer] Power of [employer] Power of [employer] Power of Attorney Healthcare Power of Attorney  Does patient want to make changes to medical advance directive?  No - Patient declined    No - Patient declined   Copy of Healthcare Power of  Attorney in Chart? No - copy requested No - copy requested         Current Medications (verified) Outpatient Encounter Medications as of 06/08/2024  Medication Sig   ALPRAZolam  (XANAX ) 0.25 MG tablet Take 0.25 mg by mouth as needed.   amLODipine  (NORVASC ) 5 MG tablet Take 1 tablet (5 mg total) by mouth daily.   atorvastatin  (LIPITOR ) 10 MG tablet Take 1 tablet (10 mg total) by mouth daily.   busPIRone  (BUSPAR ) 7.5 MG tablet Take 1 tablet (7.5 mg total) by mouth 2 (two) times daily.   estradiol (ESTRACE) 0.01 % CREA vaginal cream apply a quarter size three times weekly   estradiol (ESTRACE) 1 MG tablet Take 1 mg by mouth daily. as directed   zoledronic  acid (RECLAST ) 5 MG/100ML SOLN injection Inject 5 mg into the vein once. Every 12 months at provider's office   amoxicillin (AMOXIL) 500 MG capsule Take 1,000 mg by mouth 2 (two) times daily. (Patient not taking: Reported on 06/08/2024)   No facility-administered encounter medications on file as of 06/08/2024.    Allergies (verified) Alendronate, Iohexol , Nitrofurantoin, Sulfa antibiotics, and Losartan   History: Past Medical History:  Diagnosis Date   Anxiety    Arthritis    Asthma    hx of    Cancer (HCC)    face pre cancerous lesion removed   GERD (gastroesophageal reflux disease)    Hypertension    Thrombocytopenia    Past Surgical History:  Procedure Laterality Date   ABDOMINAL HYSTERECTOMY  ANKLE FUSION Left    CONVERSION TO TOTAL HIP Left 03/27/2014   Procedure: CONVERSION OF FAILED LEFT HIP HEMI TO TOTAL HIP ARTHROPLASTY;  Surgeon: Donnice JONETTA Car, MD;  Location: [facility];  Service: Orthopedics;  Laterality: Left;   EYE SURGERY     bilateral cataract surgery    GANGLION CYST EXCISION     right knee    HIP ARTHROPLASTY Left 05/13/2013   Procedure: ARTHROPLASTY BIPOLAR HIP;  Surgeon: Elspeth JONELLE Her, MD;  Location: [facility] OR;  Service: Orthopedics;  Laterality: Left;   knee cyst surgery      left ankle surgery      due to  fracture    OVARIAN CYST SURGERY     TOTAL KNEE ARTHROPLASTY Right 01/26/2020   Procedure: TOTAL KNEE ARTHROPLASTY;  Surgeon: Car Donnice, MD;  Location: [facility];  Service: Orthopedics;  Laterality: Right;  70 mins   Family History  Problem Relation Age of Onset   Alzheimer's disease Mother    Dementia Father    Social History   Socioeconomic History   Marital status: Married    Spouse name: Elsie   Number of children: Not on file   Years of education: Not on file   Highest education level: Not on file  Occupational History   Occupation: RETIRED  Tobacco Use   Smoking status: Never   Smokeless tobacco: Never  Vaping Use   Vaping status: Never Used  Substance and Sexual Activity   Alcohol  use: No   Drug use: No   Sexual activity: Not on file  Other Topics Concern   Not on file  Social History Narrative   Lives with husband who is my patient.  Never cigarette smoker.  1 son adopted during a previous marriage.           Lives with husband and step daughter/2025 has a dog   Social Drivers of Corporate Investment Banker Strain: Low Risk  (06/08/2024)   Overall Financial Resource Strain (CARDIA)    Difficulty of Paying Living Expenses: Not hard at all  Food Insecurity: No Food Insecurity (06/08/2024)   Hunger Vital Sign    Worried About Running Out of Food in the Last Year: Never true    Ran Out of Food in the Last Year: Never true  Transportation Needs: No Transportation Needs (06/08/2024)   PRAPARE - Administrator, Civil Service (Medical): No    Lack of Transportation (Non-Medical): No  Physical Activity: Inactive (06/08/2024)   Exercise Vital Sign    Days of Exercise per Week: 0 days    Minutes of Exercise per Session: 0 min  Stress: Stress Concern Present (06/08/2024)   [employer] of Occupational Health - Occupational Stress Questionnaire    Feeling of Stress: To some extent  Social Connections: Moderately Isolated (06/08/2024)   Social  Connection and Isolation Panel    Frequency of Communication with Friends and Family: More than three times a week    Frequency of Social Gatherings with Friends and Family: Never    Attends Religious Services: Never    Database Administrator or Organizations: No    Attends Engineer, Structural: Never    Marital Status: Married    Tobacco Counseling Counseling given: Not Answered    Clinical Intake:  Pre-visit preparation completed: Yes  Pain : No/denies pain     Nutritional Risks: None Diabetes: No  Lab Results  Component Value Date   HGBA1C 5.3 10/04/2022  How often do you need to have someone help you when you read instructions, pamphlets, or other written materials from your doctor or pharmacy?: 1 - Never  Interpreter Needed?: No  Information entered by :: Glenn Christo, RMA   Activities of Daily Living     06/08/2024    3:58 PM  In your present state of health, do you have any difficulty performing the following activities:  Hearing? 0  Vision? 0  Difficulty concentrating or making decisions? 0  Walking or climbing stairs? 0  Dressing or bathing? 0  Doing errands, shopping? 0  Preparing Food and eating ? N  Using the Toilet? N  In the past six months, have you accidently leaked urine? N  Do you have problems with loss of bowel control? N  Managing your Medications? N  Managing your Finances? N  Housekeeping or managing your Housekeeping? N    Patient Care Team: Jerrell Cleatus Ned, MD as PCP - General (Internal Medicine)  I have updated your Care Teams any recent Medical Services you may have received from other providers in the past year.     Assessment:   This is a routine wellness examination for Heather Fitzgerald.  Hearing/Vision screen Hearing Screening - Comments:: Denies hearing difficulties   Vision Screening - Comments:: Wears eyeglasses/ Dr. CHRISTELLA Evens/up to date per pt   Goals Addressed             This Visit's Progress     Patient Stated       Keep doing what she is doing/2025       Depression Screen     06/08/2024    4:07 PM 04/19/2024    2:07 PM  PHQ 2/9 Scores  PHQ - 2 Score 0 0  PHQ- 9 Score 0 2    Fall Risk     06/08/2024    4:03 PM 04/19/2024    2:07 PM 03/10/2019    1:40 PM  Fall Risk   Falls in the past year? 1 0 0   Comment   Emmi Telephone Survey: data to providers prior to load   Number falls in past yr: 1 0   Injury with Fall? 0 0   Risk for fall due to :  No Fall Risks   Follow up Falls evaluation completed;Falls prevention discussed Falls evaluation completed      Data saved with a previous flowsheet row definition    MEDICARE RISK AT HOME:  Medicare Risk at Home Any stairs in or around the home?: Yes If so, are there any without handrails?: No Home free of loose throw rugs in walkways, pet beds, electrical cords, etc?: Yes Adequate lighting in your home to reduce risk of falls?: Yes Life alert?: No Use of a cane, walker or w/c?: No Grab bars in the bathroom?: No Shower chair or bench in shower?: Yes Elevated toilet seat or a handicapped toilet?: Yes  TIMED UP AND GO:  Was the test performed?  No  Cognitive Function: 6CIT completed        06/08/2024    4:03 PM  6CIT Screen  What Year? 0 points  What time? 0 points  Count back from 20 0 points  Months in reverse 0 points    Immunizations Immunization History  Administered Date(s) Administered   Hep A / Hep B 10/25/2015, 11/01/2015, 12/11/2015   Influenza, Quadrivalent, Recombinant, Inj, Pf 06/04/2017, 04/04/2019, 04/19/2020, 05/13/2021, 05/19/2023   Pneumococcal Polysaccharide-23 12/23/2018    Screening Tests Health Maintenance  Topic Date Due   DTaP/Tdap/Td (1 - Tdap) Never done   Influenza Vaccine  03/11/2024   COVID-19 Vaccine (5 - 2025-26 season) 04/11/2024   DEXA SCAN  10/17/2024 (Originally 06/05/2001)   Medicare Annual Wellness (AWV)  10/12/2024   Pneumococcal Vaccine: 50+ Years  Completed    Zoster Vaccines- Shingrix  Completed   Meningococcal B Vaccine  Aged Out   Hepatitis B Vaccines 19-59 Average Risk  Discontinued    Health Maintenance Items Addressed: See Nurse Notes at the end of this note  Additional Screening:  Vision Screening: Recommended annual ophthalmology exams for early detection of glaucoma and other disorders of the eye. Is the patient up to date with their annual eye exam?  Yes  Who is the provider or what is the name of the office in which the patient attends annual eye exams? Dr Evans/[facility] [place]/per pt-up to date  Dental Screening: Recommended annual dental exams for proper oral hygiene  Community Resource Referral / Chronic Care Management: CRR required this visit?  No   CCM required this visit?  No   Plan:    I have personally reviewed and noted the following in the patient's chart:   Medical and social history Use of alcohol , tobacco or illicit drugs  Current medications and supplements including opioid prescriptions. Patient is not currently taking opioid prescriptions. Functional ability and status Nutritional status Physical activity Advanced directives List of other physicians Hospitalizations, surgeries, and ER visits in previous 12 months Vitals Screenings to include cognitive, depression, and falls Referrals and appointments  In addition, I have reviewed and discussed with patient certain preventive protocols, quality metrics, and best practice recommendations. A written personalized care plan for preventive services as well as general preventive health recommendations were provided to patient.   Heather Fitzgerald, CMA   06/08/2024   After Visit Summary: (MyChart) Due to this being a telephonic visit, the after visit summary with patients personalized plan was offered to patient via MyChart   Notes: Patient stated that she has had a DEXA and will have last provider office to send  over results.  Patient stated that she has had her Flu vaccine, however, it has not been documented.  Patient will get information and let office know to up date in chart.

## 2024-06-08 NOTE — Patient Instructions (Signed)
 Heather Fitzgerald,  Thank you for taking the time for your Medicare Wellness Visit. I appreciate your continued commitment to your health goals. Please review the care plan we discussed, and feel free to reach out if I can assist you further.  Medicare recommends these wellness visits once per year to help you and your care team stay ahead of potential health issues. These visits are designed to focus on prevention, allowing your provider to concentrate on managing your acute and chronic conditions during your regular appointments.  Please note that Annual Wellness Visits do not include a physical exam. Some assessments may be limited, especially if the visit was conducted virtually. If needed, we may recommend a separate in-person follow-up with your provider.  Ongoing Care Seeing your primary care provider every 3 to 6 months helps us  monitor your health and provide consistent, personalized care. Next office visit on 07/19/2024.  Remember to get last bone density sent to Dr. Cleatus as soon as you can.  Also remember to get documentation of Flu vaccine given recently and also of your tetanus vaccine.    Referrals If a referral was made during today's visit and you haven't received any updates within two weeks, please contact the referred provider directly to check on the status.  Recommended Screenings:  Health Maintenance  Topic Date Due   DTaP/Tdap/Td vaccine (1 - Tdap) Never done   Flu Shot  03/11/2024   COVID-19 Vaccine (5 - 2025-26 season) 04/11/2024   DEXA scan (bone density measurement)  10/17/2024*   Medicare Annual Wellness Visit  10/12/2024   Pneumococcal Vaccine for age over 64  Completed   Zoster (Shingles) Vaccine  Completed   Meningitis B Vaccine  Aged Out   Hepatitis B Vaccine  Discontinued  *Topic was postponed. The date shown is not the original due date.       06/08/2024    4:02 PM  Advanced Directives  Does Patient Have a Medical Advance Directive? Yes  Type of  Estate Agent of [place];Living will  Copy of Healthcare Power of Attorney in Chart? No - copy requested   Advance Care Planning is important because it: Ensures you receive medical care that aligns with your values, goals, and preferences. Provides guidance to your family and loved ones, reducing the emotional burden of decision-making during critical moments.  Vision: Annual vision screenings are recommended for early detection of glaucoma, cataracts, and diabetic retinopathy. These exams can also reveal signs of chronic conditions such as diabetes and high blood pressure.  Dental: Annual dental screenings help detect early signs of oral cancer, gum disease, and other conditions linked to overall health, including heart disease and diabetes.  Please see the attached documents for additional preventive care recommendations.

## 2024-07-04 ENCOUNTER — Other Ambulatory Visit: Payer: Self-pay | Admitting: Student in an Organized Health Care Education/Training Program

## 2024-07-19 ENCOUNTER — Ambulatory Visit: Admitting: Student in an Organized Health Care Education/Training Program

## 2024-07-28 ENCOUNTER — Encounter: Payer: Self-pay | Admitting: Student in an Organized Health Care Education/Training Program

## 2024-07-28 ENCOUNTER — Ambulatory Visit (INDEPENDENT_AMBULATORY_CARE_PROVIDER_SITE_OTHER): Admitting: Student in an Organized Health Care Education/Training Program

## 2024-07-28 VITALS — BP 151/49 | HR 85 | Wt 103.0 lb

## 2024-07-28 DIAGNOSIS — F339 Major depressive disorder, recurrent, unspecified: Secondary | ICD-10-CM

## 2024-07-28 DIAGNOSIS — I10 Essential (primary) hypertension: Secondary | ICD-10-CM

## 2024-07-28 DIAGNOSIS — M2011 Hallux valgus (acquired), right foot: Secondary | ICD-10-CM | POA: Diagnosis not present

## 2024-07-28 DIAGNOSIS — R636 Underweight: Secondary | ICD-10-CM | POA: Diagnosis not present

## 2024-07-28 DIAGNOSIS — Z23 Encounter for immunization: Secondary | ICD-10-CM

## 2024-07-28 MED ORDER — ALPRAZOLAM 0.25 MG PO TABS: 0.2500 mg | ORAL_TABLET | Freq: Every evening | ORAL | 0 refills | Status: DC | PRN

## 2024-07-28 NOTE — Progress Notes (Signed)
 Acute Office Visit  Subjective:     Patient ID: Heather Fitzgerald, female    DOB: September 19, 1933, 89 y.o.   MRN: [id number]  Chief Complaint  Patient presents with   Hypertension    3 month follow up     HPI  89 year old person here for management of depression and blood pressure.  Her husband passed away about 10 days ago and she has been struggling at home.  Services happened on Tuesday.  Had some family, reported overall good experience.  Now she is living with her stepdaughter.  She was taking care of her husband and really focusing all of her energy on his medical needs for the last several months.  She reports feeling very sad throughout the day, cries often, difficulty sleeping at night.  Has increased the use of Xanax  to almost nightly at this point with some good benefit.  Says it does help her sleep.  Denies any falls or adverse side effects.  She reports increasing pain in her right foot.  Has had bunions there for many years.  Previously managed with Dr. Harden, declined surgery in the past.  The discomfort that she is feeling now is starting to limit her ability to walk.  No traumas, pain is resolved today.  Feeling occasionally dizzy when standing or bending over.  Felt dizzy earlier today.  Only thing she is eating today was a couple Oreo cookies.  Oral intake has been similarly for the last several days.      Objective:    BP (!) 151/49   Pulse 85   Wt 103 lb (46.7 kg)   SpO2 99%   BMI 18.84 kg/m   Physical Exam  Gen: Sad appearing woman, high frailty Ext: Warm, no edema Feet: The right foot has a large hallux valgus with deformities of the other digits.  There is no soft tissue changes, no erythema, no ulcerations, no odor. Psych: Depressed appearing affect, tearful at times        Assessment & Plan:   Problem List Items Addressed This Visit       High   Underweight (Chronic)   Chronic and worsening issue.  Weight is down to 103 pounds, was 108  pounds 3 months ago.  Most likely due to acute grief response, husband passed away about 2 weeks ago.  She has not been eating much, worsening nutrition.  I really encouraged her to eat more calories throughout the day, and better quality calories.  Hopefully this will improve in the coming months as the grief improves.      Depression, recurrent - Primary (Chronic)   Chronic issue, now worsened with acute grief after the passing of her husband last week.  Struggling at home.  Living with her stepdaughter now.  Going through a normal but difficult grief response.  Has been using Xanax  for many years as needed, using it more frequently now at night to help with sleep.  No falls or adverse side effects from the increased use.  I refilled Xanax  0.25 mg.  We talked about normal expectations for grief.  Continue with buspirone  7.5 mg twice daily.  Follow-up with me in 1 month.      Relevant Medications   ALPRAZolam  (XANAX ) 0.25 MG tablet     Medium    Essential hypertension, benign (Chronic)   Chronic issue.  Having more lightheadedness on standing and an episode of presyncope today.  Was using amlodipine  but ran out of that medication yesterday.  Today off medication her blood pressure is 151/49.  Very low diastolic pressure puts her at risk for symptoms and falls and coronary hypoperfusion.  I recommended discontinuing amlodipine  and tolerating higher systolic blood pressures for now.  We talked about improving her oral intake and maintaining hydration.      Hallux valgus (acquired), right foot (Chronic)   Chronic issue of hallux valgus of the right great toe with resulting deformities of the other toes.  She is declined surgical interventions for this in the past, but now is more symptomatic.  Having more discomfort and pain in the foot.  No ulcerations, no cellulitis, no new major callus to explain her increased discomfort.  I recommended consultation with podiatry for palliative management to help  with her discomfort.      Relevant Orders   Ambulatory referral to Podiatry   Other Visit Diagnoses       Needs flu shot       Relevant Orders   Flu vaccine HIGH DOSE PF(Fluzone Trivalent) (Completed)       Meds ordered this encounter  Medications   ALPRAZolam  (XANAX ) 0.25 MG tablet    Sig: Take 1 tablet (0.25 mg total) by mouth at bedtime as needed for anxiety.    Dispense:  30 tablet    Refill:  0    Return in about 4 weeks (around 08/25/2024) for Grief management.  Cleatus Debby Specking, MD

## 2024-07-28 NOTE — Assessment & Plan Note (Signed)
 Chronic issue.  Having more lightheadedness on standing and an episode of presyncope today.  Was using amlodipine  but ran out of that medication yesterday.  Today off medication her blood pressure is 151/49.  Very low diastolic pressure puts her at risk for symptoms and falls and coronary hypoperfusion.  I recommended discontinuing amlodipine  and tolerating higher systolic blood pressures for now.  We talked about improving her oral intake and maintaining hydration.

## 2024-07-28 NOTE — Assessment & Plan Note (Signed)
 Chronic and worsening issue.  Weight is down to 103 pounds, was 108 pounds 3 months ago.  Most likely due to acute grief response, husband passed away about 2 weeks ago.  She has not been eating much, worsening nutrition.  I really encouraged her to eat more calories throughout the day, and better quality calories.  Hopefully this will improve in the coming months as the grief improves.

## 2024-07-28 NOTE — Assessment & Plan Note (Signed)
 Chronic issue, now worsened with acute grief after the passing of her husband last week.  Struggling at home.  Living with her stepdaughter now.  Going through a normal but difficult grief response.  Has been using Xanax  for many years as needed, using it more frequently now at night to help with sleep.  No falls or adverse side effects from the increased use.  I refilled Xanax  0.25 mg.  We talked about normal expectations for grief.  Continue with buspirone  7.5 mg twice daily.  Follow-up with me in 1 month.

## 2024-07-28 NOTE — Assessment & Plan Note (Signed)
 Chronic issue of hallux valgus of the right great toe with resulting deformities of the other toes.  She is declined surgical interventions for this in the past, but now is more symptomatic.  Having more discomfort and pain in the foot.  No ulcerations, no cellulitis, no new major callus to explain her increased discomfort.  I recommended consultation with podiatry for palliative management to help with her discomfort.

## 2024-08-12 ENCOUNTER — Telehealth: Payer: Self-pay | Admitting: Student in an Organized Health Care Education/Training Program

## 2024-08-12 NOTE — Telephone Encounter (Unsigned)
 Copied from CRM #[id number]. Topic: Clinical - Medication Refill >> Aug 12, 2024  2:04 PM Viola F wrote: Medication: busPIRone  (BUSPAR ) 7.5 MG tablet [[id number]]  Has the patient contacted their pharmacy? Yes (Agent: If no, request that the patient contact the pharmacy for the refill. If patient does not wish to contact the pharmacy document the reason why and proceed with request.) (Agent: If yes, when and what did the pharmacy advise?)  This is the patient's preferred pharmacy:  [facility] [id number] [place], [place] - [address] [place] [address] Phone: [phone] Fax: [phone]  Is this the correct pharmacy for this prescription? Yes If no, delete pharmacy and type the correct one.   Has the prescription been filled recently? Yes  Is the patient out of the medication? No  Has the patient been seen for an appointment in the last year OR does the patient have an upcoming appointment? Yes  Can we respond through MyChart? Yes  Agent: Please be advised that Rx refills may take up to 3 business days. We ask that you follow-up with your pharmacy.

## 2024-08-15 ENCOUNTER — Other Ambulatory Visit: Payer: Self-pay

## 2024-08-15 MED ORDER — BUSPIRONE HCL 7.5 MG PO TABS: 7.5000 mg | ORAL_TABLET | Freq: Two times a day (BID) | ORAL | 0 refills | Status: AC

## 2024-08-15 NOTE — Telephone Encounter (Signed)
 Medication has been refilled.

## 2024-08-18 ENCOUNTER — Ambulatory Visit: Admitting: Podiatry

## 2024-08-18 DIAGNOSIS — M216X1 Other acquired deformities of right foot: Secondary | ICD-10-CM | POA: Diagnosis not present

## 2024-08-18 DIAGNOSIS — M2041 Other hammer toe(s) (acquired), right foot: Secondary | ICD-10-CM | POA: Diagnosis not present

## 2024-08-18 NOTE — Patient Instructions (Signed)
  Choose a moisturizer from  the list below:  For normal skin: Moisturize feet once daily; do not apply between toes CeraVe Daily Moisturizing Lotion Lubriderm Advanced Therapy Lotion or Lubriderm Intense Skin Repair Lotion Aquaphor Intensive Repair Lotion Gold Bond Ultimate Diabetic Foot Lotion Eucerin Intensive Repair Moisturizing Lotion  For extremely dry, cracked feet: moisturize feet once daily; do not apply between toes Bag Balm Skin Moisturizer (green tin can) CeraVe Healing Ointment Eucerin Aquaphor Advanced Repair Cream Vaseline Petroleum Healing Jelly   If you have problems reaching your feet: apply to feet once daily; do not apply between toes Aquaphor Advanced Therapy Ointment Body Spray  Vaseline Intensive Care Spray Moisturizer (Unscented,  Cocoa Radiant Spray or Aloe Smooth Spray)

## 2024-08-24 NOTE — Progress Notes (Signed)
 Subjective:   Patient ID: Heather Fitzgerald, female   DOB: 89 y.o.   MRN: [id number]   HPI Chief Complaint  Patient presents with   Foot Pain    Patient has been experiencing pain on the side of her R foot for about 8 months , patient reports pain has caused her to wake up in the middle of the night. she has tried warm water  soaks wit no changes.   89-year-old female presents the office with above concerns.  States that she gets tenderness on the area of bunion, callus pointing to the submetatarsal 5.  Does not report any recent injuries.  The area is tender with pressure and walking.  No open lesions or any bleeding or drainage.   Review of Systems  All other systems reviewed and are negative.  Past Medical History:  Diagnosis Date   Anxiety    Arthritis    Asthma    hx of    Cancer (HCC)    face pre cancerous lesion removed   GERD (gastroesophageal reflux disease)    Hypertension    Thrombocytopenia     Past Surgical History:  Procedure Laterality Date   ABDOMINAL HYSTERECTOMY     ANKLE FUSION Left    CONVERSION TO TOTAL HIP Left 03/27/2014   Procedure: CONVERSION OF FAILED LEFT HIP HEMI TO TOTAL HIP ARTHROPLASTY;  Surgeon: Donnice JONETTA Car, MD;  Location: [facility];  Service: Orthopedics;  Laterality: Left;   EYE SURGERY     bilateral cataract surgery    GANGLION CYST EXCISION     right knee    HIP ARTHROPLASTY Left 05/13/2013   Procedure: ARTHROPLASTY BIPOLAR HIP;  Surgeon: Elspeth JONELLE Her, MD;  Location: [facility] OR;  Service: Orthopedics;  Laterality: Left;   knee cyst surgery      left ankle surgery      due to fracture    OVARIAN CYST SURGERY     TOTAL KNEE ARTHROPLASTY Right 01/26/2020   Procedure: TOTAL KNEE ARTHROPLASTY;  Surgeon: Car Donnice, MD;  Location: [facility];  Service: Orthopedics;  Laterality: Right;  70 mins    Current Medications[1]  Allergies[2]        Objective:  Physical Exam  General: AAO x3, NAD  Dermatological: Thick hyperkeratotic tissue  noted right foot submetatarsal 5 without any underlying ulceration, drainage or any signs of infection.  No open lesions.  Vascular: Dorsalis Pedis artery and Posterior Tibial artery pedal pulses are 2/4 bilateral with immedate capillary fill time. There is no pain with calf compression, swelling, warmth, erythema.   Neruologic: Grossly intact via light touch bilateral.   Musculoskeletal: Significant digital contractures present.  There is prominence of the metatarsal heads plantarly with atrophy of the fat pad.  Tenderness to hyperkeratotic lesion submetatarsal 5 but no other areas of discomfort.         Assessment:   Prominent metatarsal heads, digital contractures results in hyperkeratotic lesion     Plan:  -Treatment options discussed including all alternatives, risks, and complications -Etiology of symptoms were discussed - Serpe debrided hyperkeratotic tissue with any complications or bleeding.  Discussed moisturizer, offloading.  Dispensed offloading pads.  Continue shoes to help decrease pressure.  Unfortunately her risk of the callus, back and would likely need routine maintenance.  Return if symptoms worsen or fail to improve.  Donnice JONELLE Fees DPM          [1]  Current Outpatient Medications:    ALPRAZolam  (XANAX ) 0.25 MG tablet, Take 1  tablet (0.25 mg total) by mouth at bedtime as needed for anxiety., Disp: 30 tablet, Rfl: 0   atorvastatin  (LIPITOR ) 10 MG tablet, Take 1 tablet (10 mg total) by mouth daily., Disp: 90 tablet, Rfl: 3   busPIRone  (BUSPAR ) 7.5 MG tablet, Take 1 tablet (7.5 mg total) by mouth 2 (two) times daily., Disp: 60 tablet, Rfl: 0   estradiol (ESTRACE) 0.01 % CREA vaginal cream, apply a quarter size three times weekly, Disp: , Rfl:    estradiol (ESTRACE) 1 MG tablet, Take 1 mg by mouth daily. as directed, Disp: , Rfl:    zoledronic  acid (RECLAST ) 5 MG/100ML SOLN injection, Inject 5 mg into the vein once. Every 12 months at provider's office, Disp:  , Rfl:  [2]  Allergies Allergen Reactions   Alendronate Other (See Comments)    GI upset   Iohexol  Hives and Other (See Comments)    Code: HIVES, Desc: (+) SKIN TEST 70YRS AGO, OK W/ 1VP 15 YRS AGO, TODAY W/ HIVES, 50MG  BENADRYL  PO.SABRAPT OK, SUGGESTED 13 PRE MEDS FOR FUTURE IV CONTRAST PER DR. MATTERN//A.C.PT CALLS US  2 DAYS POST INJ, SHE HAS BEEN NAUSEATED SINCE EXAM & HAS STARTED TO ITCH AGAIN, WE RE, Onset Date: 04/06/2006       Nitrofurantoin Other (See Comments)   Sulfa Antibiotics Nausea Only   Losartan Rash

## 2024-08-25 ENCOUNTER — Encounter: Payer: Self-pay | Admitting: Student in an Organized Health Care Education/Training Program

## 2024-08-25 ENCOUNTER — Ambulatory Visit: Admitting: Student in an Organized Health Care Education/Training Program

## 2024-08-25 VITALS — BP 144/49 | HR 67 | Temp 97.9°F | Ht 62.0 in | Wt 104.0 lb

## 2024-08-25 DIAGNOSIS — M2011 Hallux valgus (acquired), right foot: Secondary | ICD-10-CM

## 2024-08-25 DIAGNOSIS — I10 Essential (primary) hypertension: Secondary | ICD-10-CM

## 2024-08-25 DIAGNOSIS — R636 Underweight: Secondary | ICD-10-CM | POA: Diagnosis not present

## 2024-08-25 DIAGNOSIS — F339 Major depressive disorder, recurrent, unspecified: Secondary | ICD-10-CM | POA: Diagnosis not present

## 2024-08-25 DIAGNOSIS — K589 Irritable bowel syndrome without diarrhea: Secondary | ICD-10-CM | POA: Diagnosis not present

## 2024-08-25 NOTE — Assessment & Plan Note (Signed)
 Recurrent depression is exacerbated by grief and stress, leading to difficulty sleeping, anxiety, and reluctance to engage in activities. Grief is a significant factor. Encouraged her to seek help from family and friends, increase social interaction, and get out of the house more often. Offered availability for support as needed.

## 2024-08-25 NOTE — Assessment & Plan Note (Signed)
 Blood pressure is slightly elevated, consistent with home readings. Lightheadedness persists despite discontinuation of antihypertensive medication. Rechecked blood pressure before leaving the clinic.

## 2024-08-25 NOTE — Assessment & Plan Note (Signed)
 Increased urgency to defecate, likely exacerbated by stress and grief. Continue to monitor symptoms and manage stress to potentially alleviate symptoms.

## 2024-08-25 NOTE — Patient Instructions (Signed)
" °  VISIT SUMMARY: During your visit, we discussed your anxiety and breathing difficulties following the recent loss of your husband. We also addressed your sleep issues, weight concerns, blood pressure, foot callus, and irritable bowel syndrome. We reviewed your current medications and support system.  YOUR PLAN: -MAJOR DEPRESSIVE DISORDER, RECURRENT: Recurrent depression is a condition where you experience repeated episodes of depression. Your depression is currently worsened by grief and stress, leading to difficulty sleeping, anxiety, and reluctance to engage in activities. It is important to seek help from family and friends, increase social interaction, and get out of the house more often. We are here to support you as needed.  -ESSENTIAL HYPERTENSION: Essential hypertension is high blood pressure without a known secondary cause. Your blood pressure is slightly elevated, and you continue to experience lightheadedness despite stopping your blood pressure medication. We rechecked your blood pressure before you left the clinic.  -UNDERWEIGHT: Being underweight means you weigh less than what is considered healthy for your height. Your weight has fluctuated, but you gained one pound in the last month. Stress and grief may be contributing to your difficulty gaining weight.  -HALLUX VALGUS, RIGHT FOOT: Hallux valgus is a condition where the big toe deviates towards the other toes, often causing a bunion. The callus on your foot was shaved, and the swelling and soreness have improved. Continue using padded inserts for comfort when walking, even though it may be difficult to keep them in place.  -IRRITABLE BOWEL SYNDROME: Irritable bowel syndrome (IBS) is a gastrointestinal disorder causing symptoms like abdominal pain, bloating, and changes in bowel habits. Your symptoms have increased, likely due to stress and grief. Continue to monitor your symptoms and manage stress to help alleviate  them.  INSTRUCTIONS: Please follow up with us  if your symptoms worsen or if you need additional support. Continue monitoring your blood pressure at home and keep track of your readings. Make an effort to increase your social interactions and get out of the house more often. Continue using padded inserts for your foot and manage your stress to help with your IBS symptoms.  "

## 2024-08-25 NOTE — Progress Notes (Signed)
 "  Established Patient Office Visit  Patient ID: Heather Fitzgerald, female    DOB: November 04, 1933  Age: 89 y.o. MRN: [id number] PCP: Jerrell Cleatus Ned, MD  Chief Complaint  Patient presents with   Follow-up    Patient wants to discuss sometimes having to breath deep, thinks it is nerves. Sometimes doing good and then sometimes has this issue.     Subjective:     HPI  Discussed the use of AI scribe software for clinical note transcription with the patient, who gave verbal consent to proceed.  History of Present Illness Heather Fitzgerald is an 89 year old female who presents with anxiety and breathing difficulties following the recent loss of her husband.  She experiences intermittent anxiety and difficulty breathing, describing it as 'my air's not coming up like it should,' which requires her to breathe deeply. She also experiences occasional tightness in her chest, though she is unsure if it qualifies as chest pain. No current chest pain, shortness of breath, or palpitations.  Her sleep is significantly affected, taking over two hours to fall asleep due to recurring thoughts of her husband's passing. She dreads going to bed and is trying to prevent a decline in her mental health similar to what she experienced after losing her first husband.  She has some support from family, including her late husband's daughter who lives with her and her son who is handicapped but checks on her regularly. Her sister has also visited and stayed with her for a few days, helping her get out of the house occasionally. She has not been getting out of the house daily but did go out to eat with friends recently, which she found challenging. She is involved in managing her late husband's estate, which is a source of stress due to unresolved legal matters.  She has a history of IBS, which has been exacerbated by stress, causing her to frequently run to the bathroom after eating. She is concerned about  her weight, noting fluctuations and difficulty gaining weight despite gaining one pound in the last month.  She is currently taking buspirone  twice a day but is not taking alprazolam  unless needed. She was taken off blood pressure medication due to lightheadedness, which persists but without swelling. She monitors her blood pressure at home, noting it runs high but cannot recall specific readings.  She recently saw a podiatrist for a callus on her foot, which was causing leg swelling and soreness. The callus was shaved, and she was given a pad to use, though it is difficult to keep in place. She uses additional padding when walking for extended periods.     Objective:     BP (!) 143/129   Pulse 67   Temp 97.9 F (36.6 C) (Oral)   Ht 5' 2 (1.575 m)   Wt 104 lb (47.2 kg)   SpO2 100%   BMI 19.02 kg/m   Physical Exam  Gen: Sad and moderately frail appearing Neck: Normal thyroid , no nodules or adenopathy Heart: Regular, no murmur Lungs: Unlabored, clear throughout Ext: Warm, no edema    Assessment & Plan:   Problem List Items Addressed This Visit       High   Underweight - Primary (Chronic)   Weight fluctuated with a net gain of one pound in the last month. Stress and grief may contribute to difficulty gaining weight.      Depression, recurrent (Chronic)   Recurrent depression is exacerbated by grief and stress, leading to  difficulty sleeping, anxiety, and reluctance to engage in activities. Grief is a significant factor. Encouraged her to seek help from family and friends, increase social interaction, and get out of the house more often. Offered availability for support as needed.        Medium    Essential hypertension, benign (Chronic)   Blood pressure is slightly elevated, consistent with home readings. Lightheadedness persists despite discontinuation of antihypertensive medication. Rechecked blood pressure before leaving the clinic.      Hallux valgus (acquired),  right foot (Chronic)   Callus was shaved, and swelling and soreness have improved. She has difficulty keeping padding in place with shoes. Continue using padded inserts for comfort when walking.        Low   IBS (irritable bowel syndrome) (Chronic)   Increased urgency to defecate, likely exacerbated by stress and grief. Continue to monitor symptoms and manage stress to potentially alleviate symptoms.        Return in about 3 months (around 11/23/2024).    Cleatus Debby Specking, MD [facility]   "

## 2024-08-25 NOTE — Assessment & Plan Note (Signed)
 Callus was shaved, and swelling and soreness have improved. She has difficulty keeping padding in place with shoes. Continue using padded inserts for comfort when walking.

## 2024-08-25 NOTE — Assessment & Plan Note (Signed)
 Weight fluctuated with a net gain of one pound in the last month. Stress and grief may contribute to difficulty gaining weight.

## 2024-09-13 ENCOUNTER — Ambulatory Visit: Payer: Self-pay | Admitting: *Deleted

## 2024-09-13 NOTE — Telephone Encounter (Signed)
 No appointments with Dr. Mahlon or Dr. Jerrell today  Patient has appt tomorrow

## 2024-09-13 NOTE — Telephone Encounter (Signed)
" ° ° ° ° ° °  FYI Only or Action Required?: FYI only for provider: appointment scheduled on 09/14/24 and requesting appt this afternoon if possible .  Patient was last seen in primary care on 08/25/2024 by Jerrell Cleatus Ned, MD.  Called Nurse Triage reporting Urinary Frequency.  Symptoms began several days ago.  Interventions attempted: Rest, hydration, or home remedies.  Symptoms are: gradually worsening.  Triage Disposition: See HCP Within 4 Hours (Or PCP Triage)4-24 hours  Patient/caregiver understands and will follow disposition?: Yes   Please advise if any available appt this afternoon for patient. Patient reports she is getting assist for transportation. Appt scheduled for tomorrow with other provider. None available with PCP until Thursday. No fever no severe pain. Recent death of husband in 2025/08/02 and yesterday death of her son. Patient is emotional and instructed if sx worsen go to UC.              Reason for Disposition  Side (flank) or lower back pain present  Answer Assessment - Initial Assessment Questions No available appt today . Patient reports she is waiting on her sister to arrive at her home to assist her with transportation. Patient is grieving due to death of her husband in 2025/08/02. And yesterday loss of her son. Please advise if patient can be seen and evaluated this afternoon. Please advise. Recommended if sx worsen or change go to UC.        1. SYMPTOM: What's the main symptom you're concerned about? (e.g., frequency, incontinence)     Frequency urination  2. ONSET: When did the  sx  start?     2-3 days ago  3. PAIN: Is there any pain? If Yes, ask: How bad is it? (Scale: 1-10; mild, moderate, severe)     Moderate  4. CAUSE: What do you think is causing the symptoms?     UTI 5. OTHER SYMPTOMS: Do you have any other symptoms? (e.g., blood in urine, fever, flank pain, pain with urination)     Frequency urination , low back pain, left  side, burning with urination at times not every time. No fever no blood in urine. No odor reported.  Protocols used: Urinary Symptoms-A-AH  "

## 2024-09-14 ENCOUNTER — Encounter: Payer: Self-pay | Admitting: Family Medicine

## 2024-09-14 ENCOUNTER — Ambulatory Visit: Admitting: Family Medicine

## 2024-09-14 VITALS — BP 124/58 | HR 62 | Temp 98.5°F | Ht 62.0 in | Wt 102.2 lb

## 2024-09-14 DIAGNOSIS — R300 Dysuria: Secondary | ICD-10-CM

## 2024-09-14 DIAGNOSIS — R350 Frequency of micturition: Secondary | ICD-10-CM

## 2024-09-14 LAB — POCT URINALYSIS DIPSTICK
Bilirubin, UA: NEGATIVE
Blood, UA: POSITIVE — AB
Glucose, UA: NEGATIVE
Ketones, UA: NEGATIVE
Leukocytes, UA: NEGATIVE
Nitrite, UA: NEGATIVE
Protein, UA: POSITIVE — AB
Spec Grav, UA: 1.02
Urobilinogen, UA: NEGATIVE U/dL — AB
pH, UA: 6

## 2024-09-14 MED ORDER — CEPHALEXIN 500 MG PO CAPS: 500.0000 mg | ORAL_CAPSULE | Freq: Two times a day (BID) | ORAL | 0 refills | Status: AC

## 2024-09-14 NOTE — Progress Notes (Signed)
" ° °  Subjective:    Patient ID: Heather Fitzgerald, female    DOB: 10/16/1933, 89 y.o.   MRN: [id number]  HPI Dysuria- pt reports sxs started 3-5 days ago.  Sxs started w/ frequency and progressed to burning w/ urination.  Having back pain.  Denies suprapubic tenderness.  Has hx of bladder infxns and this feels similar.  No visible blood.  Urine is dark in color.     Review of Systems For ROS see HPI     Objective:   Physical Exam Vitals reviewed.  Constitutional:      General: She is not in acute distress.    Appearance: She is well-developed.     Comments: Very frail  HENT:     Head: Normocephalic and atraumatic.  Abdominal:     General: There is no distension.     Palpations: Abdomen is soft.     Tenderness: There is no abdominal tenderness (no suprapubic or CVA tenderness).  Musculoskeletal:        General: Tenderness (TTP over L lower back) present.  Skin:    General: Skin is warm and dry.  Neurological:     Mental Status: She is alert and oriented to person, place, and time.           Assessment & Plan:  Dysuria/Frequency urination- new.  Pt's sxs started 3-5 days ago and have worsened.  What initially started as frequency has progressed to dysuria.  UA is suspicious for infxn and pt has hx of similar.  Start Keflex  while awaiting urine cx results.  Pt expressed understanding and is in agreement w/ plan.   "

## 2024-09-14 NOTE — Patient Instructions (Signed)
 Follow up as needed or as scheduled START the Cephalexin  twice daily- take w/ food Drink LOTS of fluids Call with any questions or concerns Stay Safe!  Stay Healthy! I'm SO sorry for your loss

## 2024-09-15 LAB — URINE CULTURE
MICRO NUMBER:: 17548193
Result:: NO GROWTH
SPECIMEN QUALITY:: ADEQUATE

## 2024-09-16 ENCOUNTER — Ambulatory Visit: Payer: Self-pay | Admitting: Family Medicine

## 2024-11-25 ENCOUNTER — Ambulatory Visit: Admitting: Student in an Organized Health Care Education/Training Program
